# Patient Record
Sex: Female | Born: 1975 | Race: White | Hispanic: No | Smoking: Current some day smoker
Health system: Southern US, Community
[De-identification: ages and names within clinical notes are randomized; demographics above are authoritative.]

## PROBLEM LIST (undated history)

## (undated) ENCOUNTER — Encounter: Attending: Physical Medicine & Rehabilitation | Primary: Physical Medicine & Rehabilitation

## (undated) ENCOUNTER — Ambulatory Visit: Payer: MEDICAID

## (undated) ENCOUNTER — Encounter: Attending: Clinical | Primary: Clinical

## (undated) ENCOUNTER — Ambulatory Visit

## (undated) ENCOUNTER — Ambulatory Visit: Attending: Mental Health | Primary: Mental Health

## (undated) ENCOUNTER — Encounter

## (undated) ENCOUNTER — Ambulatory Visit: Payer: BLUE CROSS/BLUE SHIELD

## (undated) ENCOUNTER — Other Ambulatory Visit: Attending: Clinical | Primary: Clinical

## (undated) ENCOUNTER — Ambulatory Visit: Payer: MEDICARE | Attending: Transplant Hepatology | Primary: Transplant Hepatology

## (undated) ENCOUNTER — Telehealth

## (undated) ENCOUNTER — Other Ambulatory Visit

## (undated) ENCOUNTER — Encounter: Attending: Mental Health | Primary: Mental Health

## (undated) ENCOUNTER — Ambulatory Visit: Attending: Psychiatry | Primary: Psychiatry

## (undated) ENCOUNTER — Ambulatory Visit: Payer: MEDICARE

## (undated) ENCOUNTER — Inpatient Hospital Stay

## (undated) ENCOUNTER — Ambulatory Visit: Payer: PRIVATE HEALTH INSURANCE | Attending: Clinical | Primary: Clinical

## (undated) ENCOUNTER — Ambulatory Visit: Attending: Clinical | Primary: Clinical

## (undated) ENCOUNTER — Ambulatory Visit: Attending: Pharmacist | Primary: Pharmacist

## (undated) ENCOUNTER — Ambulatory Visit: Payer: MEDICAID | Attending: Mental Health | Primary: Mental Health

## (undated) ENCOUNTER — Ambulatory Visit: Payer: MEDICAID | Attending: Physical Medicine & Rehabilitation | Primary: Physical Medicine & Rehabilitation

## (undated) ENCOUNTER — Other Ambulatory Visit: Attending: Psychiatry | Primary: Psychiatry

## (undated) ENCOUNTER — Ambulatory Visit: Payer: PRIVATE HEALTH INSURANCE | Attending: Mental Health | Primary: Mental Health

## (undated) ENCOUNTER — Ambulatory Visit: Payer: MEDICARE | Attending: Clinical | Primary: Clinical

## (undated) ENCOUNTER — Ambulatory Visit
Payer: MEDICAID | Attending: Rehabilitative and Restorative Service Providers" | Primary: Rehabilitative and Restorative Service Providers"

## (undated) ENCOUNTER — Ambulatory Visit: Attending: Family Medicine | Primary: Family Medicine

## (undated) ENCOUNTER — Ambulatory Visit: Payer: PRIVATE HEALTH INSURANCE

## (undated) DIAGNOSIS — M549 Dorsalgia, unspecified: Secondary | ICD-10-CM

## (undated) DIAGNOSIS — J45909 Unspecified asthma, uncomplicated: Secondary | ICD-10-CM

## (undated) DIAGNOSIS — E079 Disorder of thyroid, unspecified: Secondary | ICD-10-CM

## (undated) DIAGNOSIS — K219 Gastro-esophageal reflux disease without esophagitis: Secondary | ICD-10-CM

## (undated) DIAGNOSIS — G8929 Other chronic pain: Secondary | ICD-10-CM

## (undated) DIAGNOSIS — F1010 Alcohol abuse, uncomplicated: Secondary | ICD-10-CM

## (undated) DIAGNOSIS — K859 Acute pancreatitis without necrosis or infection, unspecified: Secondary | ICD-10-CM

## (undated) HISTORY — PX: OTHER SURGICAL HISTORY: SHX169

---

## 2007-10-13 ENCOUNTER — Emergency Department: Payer: Self-pay | Admitting: Emergency Medicine

## 2009-02-12 ENCOUNTER — Emergency Department: Payer: Self-pay | Admitting: Internal Medicine

## 2012-05-05 ENCOUNTER — Emergency Department: Payer: Self-pay | Admitting: Emergency Medicine

## 2013-02-09 ENCOUNTER — Ambulatory Visit: Payer: Self-pay | Admitting: Family Medicine

## 2014-06-20 ENCOUNTER — Emergency Department: Payer: Self-pay | Admitting: Emergency Medicine

## 2014-06-20 LAB — COMPREHENSIVE METABOLIC PANEL
ALT: 16 U/L
Albumin: 3.7 g/dL (ref 3.4–5.0)
Alkaline Phosphatase: 83 U/L
Anion Gap: 10 (ref 7–16)
BUN: 8 mg/dL (ref 7–18)
Bilirubin,Total: 0.8 mg/dL (ref 0.2–1.0)
CREATININE: 1.29 mg/dL (ref 0.60–1.30)
Calcium, Total: 8.5 mg/dL (ref 8.5–10.1)
Chloride: 108 mmol/L — ABNORMAL HIGH (ref 98–107)
Co2: 23 mmol/L (ref 21–32)
GFR CALC NON AF AMER: 52 — AB
Glucose: 101 mg/dL — ABNORMAL HIGH (ref 65–99)
Osmolality: 280 (ref 275–301)
POTASSIUM: 3.4 mmol/L — AB (ref 3.5–5.1)
SGOT(AST): 26 U/L (ref 15–37)
SODIUM: 141 mmol/L (ref 136–145)
Total Protein: 7.2 g/dL (ref 6.4–8.2)

## 2014-06-20 LAB — URINALYSIS, COMPLETE
BILIRUBIN, UR: NEGATIVE
Glucose,UR: NEGATIVE mg/dL (ref 0–75)
KETONE: NEGATIVE
Leukocyte Esterase: NEGATIVE
NITRITE: NEGATIVE
PROTEIN: NEGATIVE
Ph: 6 (ref 4.5–8.0)
Specific Gravity: 1.004 (ref 1.003–1.030)
Squamous Epithelial: 13

## 2014-06-20 LAB — CBC WITH DIFFERENTIAL/PLATELET
Basophil #: 0.1 10*3/uL (ref 0.0–0.1)
Basophil %: 0.5 %
EOS ABS: 0.2 10*3/uL (ref 0.0–0.7)
Eosinophil %: 1.7 %
HCT: 41 % (ref 35.0–47.0)
HGB: 14 g/dL (ref 12.0–16.0)
Lymphocyte #: 2.1 10*3/uL (ref 1.0–3.6)
Lymphocyte %: 19.3 %
MCH: 34.9 pg — ABNORMAL HIGH (ref 26.0–34.0)
MCHC: 34.2 g/dL (ref 32.0–36.0)
MCV: 102 fL — ABNORMAL HIGH (ref 80–100)
MONO ABS: 0.8 x10 3/mm (ref 0.2–0.9)
Monocyte %: 7 %
NEUTROS PCT: 71.5 %
Neutrophil #: 7.8 10*3/uL — ABNORMAL HIGH (ref 1.4–6.5)
PLATELETS: 156 10*3/uL (ref 150–440)
RBC: 4.01 10*6/uL (ref 3.80–5.20)
RDW: 14.1 % (ref 11.5–14.5)
WBC: 10.9 10*3/uL (ref 3.6–11.0)

## 2014-06-20 LAB — LIPASE, BLOOD: Lipase: 2194 U/L — ABNORMAL HIGH (ref 73–393)

## 2015-06-01 ENCOUNTER — Other Ambulatory Visit: Payer: Self-pay | Admitting: Family Medicine

## 2015-06-01 DIAGNOSIS — Z1231 Encounter for screening mammogram for malignant neoplasm of breast: Secondary | ICD-10-CM

## 2015-06-16 ENCOUNTER — Ambulatory Visit: Payer: Self-pay

## 2015-06-22 ENCOUNTER — Ambulatory Visit: Payer: Self-pay | Attending: Family Medicine

## 2015-07-09 ENCOUNTER — Other Ambulatory Visit: Payer: Self-pay

## 2015-07-09 ENCOUNTER — Encounter: Payer: Self-pay | Admitting: Emergency Medicine

## 2015-07-09 ENCOUNTER — Emergency Department
Admission: EM | Admit: 2015-07-09 | Discharge: 2015-07-09 | Disposition: A | Discharge status: Home / Self Care | Payer: Medicaid Other | Attending: Emergency Medicine | Admitting: Emergency Medicine

## 2015-07-09 ENCOUNTER — Emergency Department: Payer: Medicaid Other

## 2015-07-09 DIAGNOSIS — R1013 Epigastric pain: Secondary | ICD-10-CM | POA: Insufficient documentation

## 2015-07-09 DIAGNOSIS — Z720 Tobacco use: Secondary | ICD-10-CM | POA: Diagnosis not present

## 2015-07-09 DIAGNOSIS — R1011 Right upper quadrant pain: Secondary | ICD-10-CM | POA: Insufficient documentation

## 2015-07-09 HISTORY — DX: Disorder of thyroid, unspecified: E07.9

## 2015-07-09 HISTORY — DX: Dorsalgia, unspecified: M54.9

## 2015-07-09 HISTORY — DX: Other chronic pain: G89.29

## 2015-07-09 LAB — URINALYSIS COMPLETE WITH MICROSCOPIC (ARMC ONLY)
BILIRUBIN URINE: NEGATIVE
GLUCOSE, UA: NEGATIVE mg/dL
KETONES UR: NEGATIVE mg/dL
Leukocytes, UA: NEGATIVE
NITRITE: NEGATIVE
PH: 5 (ref 5.0–8.0)
Protein, ur: 30 mg/dL — AB
SPECIFIC GRAVITY, URINE: 1.028 (ref 1.005–1.030)

## 2015-07-09 LAB — LIPASE, BLOOD: Lipase: 45 U/L (ref 22–51)

## 2015-07-09 LAB — COMPREHENSIVE METABOLIC PANEL
ALBUMIN: 4.2 g/dL (ref 3.5–5.0)
ALK PHOS: 92 U/L (ref 38–126)
ALT: 22 U/L (ref 14–54)
AST: 31 U/L (ref 15–41)
Anion gap: 11 (ref 5–15)
BILIRUBIN TOTAL: 1 mg/dL (ref 0.3–1.2)
BUN: 16 mg/dL (ref 6–20)
CALCIUM: 9 mg/dL (ref 8.9–10.3)
CO2: 24 mmol/L (ref 22–32)
CREATININE: 0.84 mg/dL (ref 0.44–1.00)
Chloride: 100 mmol/L — ABNORMAL LOW (ref 101–111)
GFR calc Af Amer: >60 mL/min (ref >60)
GFR calc non Af Amer: >60 mL/min (ref >60)
GLUCOSE: 97 mg/dL (ref 65–99)
Potassium: 3.1 mmol/L — ABNORMAL LOW (ref 3.5–5.1)
SODIUM: 135 mmol/L (ref 135–145)
TOTAL PROTEIN: 7.4 g/dL (ref 6.5–8.1)

## 2015-07-09 LAB — CBC
HCT: 42.3 % (ref 35.0–47.0)
HEMOGLOBIN: 14.3 g/dL (ref 12.0–16.0)
MCH: 33.9 pg (ref 26.0–34.0)
MCHC: 33.9 g/dL (ref 32.0–36.0)
MCV: 100.1 fL — ABNORMAL HIGH (ref 80.0–100.0)
PLATELETS: 234 10*3/uL (ref 150–440)
RBC: 4.22 MIL/uL (ref 3.80–5.20)
RDW: 13.9 % (ref 11.5–14.5)
WBC: 10.8 10*3/uL (ref 3.6–11.0)

## 2015-07-09 MED ORDER — ONDANSETRON 4 MG PO TBDP
4.0000 mg | ORAL_TABLET | Freq: Once | ORAL | Status: AC
  Administered 2015-07-09: 4 mg via ORAL
  Filled 2015-07-09: qty 1

## 2015-07-09 MED ORDER — GI COCKTAIL ~~LOC~~
30.0000 mL | Freq: Once | ORAL | Status: AC
  Administered 2015-07-09: 30 mL via ORAL
  Filled 2015-07-09: qty 30

## 2015-07-09 MED ORDER — PANTOPRAZOLE SODIUM 40 MG PO TBEC: 40.0000 mg | DELAYED_RELEASE_TABLET | Freq: Every day | ORAL | Status: DC

## 2015-07-09 MED ORDER — MORPHINE SULFATE (PF) 4 MG/ML IV SOLN
6.0000 mg | Freq: Once | INTRAVENOUS | Status: AC
  Administered 2015-07-09: 6 mg via INTRAMUSCULAR
  Filled 2015-07-09: qty 2

## 2015-07-09 MED ORDER — SUCRALFATE 1 G PO TABS: 1.0000 g | ORAL_TABLET | Freq: Four times a day (QID) | ORAL | Status: DC

## 2015-07-09 NOTE — ED Notes (Signed)
Pt reports abdominal pain x1 week, nausea/diarrhea/chills/malaise yesterday, but not today. States she was in hospital approx. 8 months ago for similar pain and was diagnosed with pancreatitis.

## 2015-07-09 NOTE — Discharge Instructions (Signed)
Please seek medical attention for any high fevers, chest pain, shortness of breath, change in behavior, persistent vomiting, bloody stool or any other new or concerning symptoms. ° °Gastritis, Adult °Gastritis is soreness and swelling (inflammation) of the lining of the stomach. Gastritis can develop as a sudden onset (acute) or long-term (chronic) condition. If gastritis is not treated, it can lead to stomach bleeding and ulcers. °CAUSES  °Gastritis occurs when the stomach lining is weak or damaged. Digestive juices from the stomach then inflame the weakened stomach lining. The stomach lining may be weak or damaged due to viral or bacterial infections. One common bacterial infection is the Helicobacter pylori infection. Gastritis can also result from excessive alcohol consumption, taking certain medicines, or having too much acid in the stomach.  °SYMPTOMS  °In some cases, there are no symptoms. When symptoms are present, they may include: °· Pain or a burning sensation in the upper abdomen. °· Nausea. °· Vomiting. °· An uncomfortable feeling of fullness after eating. °DIAGNOSIS  °Your caregiver may suspect you have gastritis based on your symptoms and a physical exam. To determine the cause of your gastritis, your caregiver may perform the following: °· Blood or stool tests to check for the H pylori bacterium. °· Gastroscopy. A thin, flexible tube (endoscope) is passed down the esophagus and into the stomach. The endoscope has a light and camera on the end. Your caregiver uses the endoscope to view the inside of the stomach. °· Taking a tissue sample (biopsy) from the stomach to examine under a microscope. °TREATMENT  °Depending on the cause of your gastritis, medicines may be prescribed. If you have a bacterial infection, such as an H pylori infection, antibiotics may be given. If your gastritis is caused by too much acid in the stomach, H2 blockers or antacids may be given. Your caregiver may recommend that you  stop taking aspirin, ibuprofen, or other nonsteroidal anti-inflammatory drugs (NSAIDs). °HOME CARE INSTRUCTIONS °· Only take over-the-counter or prescription medicines as directed by your caregiver. °· If you were given antibiotic medicines, take them as directed. Finish them even if you start to feel better. °· Drink enough fluids to keep your urine clear or pale yellow. °· Avoid foods and drinks that make your symptoms worse, such as: °¨ Caffeine or alcoholic drinks. °¨ Chocolate. °¨ Peppermint or mint flavorings. °¨ Garlic and onions. °¨ Spicy foods. °¨ Citrus fruits, such as oranges, lemons, or limes. °¨ Tomato-based foods such as sauce, chili, salsa, and pizza. °¨ Fried and fatty foods. °· Eat small, frequent meals instead of large meals. °SEEK IMMEDIATE MEDICAL CARE IF:  °· You have black or dark red stools. °· You vomit blood or material that looks like coffee grounds. °· You are unable to keep fluids down. °· Your abdominal pain gets worse. °· You have a fever. °· You do not feel better after 1 week. °· You have any other questions or concerns. °MAKE SURE YOU: °· Understand these instructions. °· Will watch your condition. °· Will get help right away if you are not doing well or get worse. °Document Released: 10/22/2001 Document Revised: 04/28/2012 Document Reviewed: 12/11/2011 °[employer]® Patient Information ©2015 [employer]. This information is not intended to replace advice given to you by your health care provider. Make sure you discuss any questions you have with your health care provider. ° °

## 2015-07-09 NOTE — ED Notes (Signed)
Pt reports epi gastric pain and RUQ pain that started about 1 week ago. And lower back pain. Denies urinary complaints. N/V/D yesterday but none today. Unknown fever reports chills.

## 2015-07-09 NOTE — ED Provider Notes (Signed)
[facility] Emergency Department Provider Note   ____________________________________________  Time seen: 1745  I have reviewed the triage vital signs and the nursing notes.   HISTORY  Chief Complaint Abdominal Pain   History limited by: Not Limited   HPI Terri Mann is a 39 y.o. female who presents to the emergency department today with concerns for epigastric and right upper quadrant pain. Patient states that this pain is been going on for roughly 1 week. It has been intermittent. She states it is worse with eating. It is located in the epigastric and right upper quadrant. Sometimes it does radiate to the back. Patient has had some nausea with dry heaves. She has also had some nonbloody diarrhea. She states she has tried taking some more BC powder recently. She states she had similar symptoms roughly 9 months ago, was seen here, and told she had pancreatitis. She has been trying to clear and light diet.     Past Medical History  Diagnosis Date  . Chronic back pain   . Thyroid disease     There are no active problems to display for this patient.   Past Surgical History  Procedure Laterality Date  . Tubial ligation       No current outpatient prescriptions on file.  Allergies Review of patient's allergies indicates no known allergies.  No family history on file.  Social History Social History  Substance Use Topics  . Smoking status: Current Some Day Smoker -- 2.00 packs/day    Types: Cigarettes  . Smokeless tobacco: Never Used  . Alcohol Use: Yes     Comment: occs.     Review of Systems  Constitutional: Negative for fever. Cardiovascular: Negative for chest pain. Respiratory: Negative for shortness of breath. Gastrointestinal: Positive for epigastric and right upper quadrant pain Genitourinary: Negative for dysuria. Musculoskeletal: Negative for back pain. Skin: Negative for rash. Neurological: Negative for headaches, focal  weakness or numbness.   10-point ROS otherwise negative.  ____________________________________________   PHYSICAL EXAM:  VITAL SIGNS: ED Triage Vitals  Enc Vitals Group     BP 07/09/15 1623 150/90 mmHg     Pulse Rate 07/09/15 1623 78     Resp 07/09/15 1623 18     Temp 07/09/15 1623 98.3 F (36.8 C)     Temp Source 07/09/15 1623 Oral     SpO2 07/09/15 1623 99 %     Weight 07/09/15 1623 175 lb (79.379 kg)     Height 07/09/15 1623  (1.702 m)     Head Cir --      Peak Flow --      Pain Score 07/09/15 1627 7   Constitutional: Alert and oriented. Well appearing and in no distress. Eyes: Conjunctivae are normal. PERRL. Normal extraocular movements. ENT   Head: Normocephalic and atraumatic.   Nose: No congestion/rhinnorhea.   Mouth/Throat: Mucous membranes are moist.   Neck: No stridor. Hematological/Lymphatic/Immunilogical: No cervical lymphadenopathy. Cardiovascular: Normal rate, regular rhythm.  No murmurs, rubs, or gallops. Respiratory: Normal respiratory effort without tachypnea nor retractions. Breath sounds are clear and equal bilaterally. No wheezes/rales/rhonchi. Gastrointestinal: Soft and minimally tender to palpation in the epigastric, right upper quadrant and right abdomen. No rebound. No guarding. No distention. There is no CVA tenderness. Genitourinary: Deferred Musculoskeletal: Normal range of motion in all extremities. No joint effusions.  No lower extremity tenderness nor edema. Neurologic:  Normal speech and language. No gross focal neurologic deficits are appreciated. Speech is normal.  Skin:  Skin is warm, dry and intact. No rash noted. Psychiatric: Mood and affect are normal. Speech and behavior are normal. Patient exhibits appropriate insight and judgment.  ____________________________________________    LABS (pertinent positives/negatives)  Labs Reviewed  COMPREHENSIVE METABOLIC PANEL - Abnormal; Notable for the following:    Potassium  3.1 (*)    Chloride 100 (*)    All other components within normal limits  CBC - Abnormal; Notable for the following:    MCV 100.1 (*)    All other components within normal limits  URINALYSIS COMPLETEWITH MICROSCOPIC ([facility] ONLY) - Abnormal; Notable for the following:    Color, Urine YELLOW (*)    APPearance HAZY (*)    Hgb urine dipstick 1+ (*)    Protein, ur 30 (*)    Bacteria, UA RARE (*)    Squamous Epithelial / LPF 6-30 (*)    All other components within normal limits  LIPASE, BLOOD     ____________________________________________   EKG  I, Phineas Semen, attending physician, personally viewed and interpreted this EKG  EKG Time: 1638 Rate: 76 Rhythm: NSR Axis: normal Intervals: qtc 423 QRS: narrow ST changes: no st elevation ____________________________________________    RADIOLOGY  RUQ US  IMPRESSION: No acute right upper quadrant process.  Similarly coarsened hepatic echotexture can be seen steatosis or hepatocellular disease.  ____________________________________________   PROCEDURES  Procedure(s) performed: None  Critical Care performed: No  ____________________________________________   INITIAL IMPRESSION / ASSESSMENT AND PLAN / ED COURSE  Pertinent labs & imaging results that were available during my care of the patient were reviewed by me and considered in my medical decision making (see chart for details).  Patient presented to the emergency department today with concerns for epigastric upper quadrant pain. Ultrasound without any gallstones or gallbladder disease. Lipase normal. Patient did feel better after GI cocktail. At this point I think gastritis is likely. Will discharge home with sucralfate. Patient is already on Protonix.  ____________________________________________   FINAL CLINICAL IMPRESSION(S) / ED DIAGNOSES  Final diagnoses:  Epigastric abdominal pain     Phineas Semen, MD 07/09/15 2317

## 2015-07-09 NOTE — ED Notes (Signed)
Pt reports relief from GI cocktail. States she is feeling better. PT is comfortable with being discharged.

## 2016-04-28 ENCOUNTER — Emergency Department
Admission: EM | Admit: 2016-04-28 | Discharge: 2016-04-29 | Disposition: A | Discharge status: Home / Self Care | Payer: Medicaid Other | Attending: Emergency Medicine | Admitting: Emergency Medicine

## 2016-04-28 ENCOUNTER — Encounter: Payer: Self-pay | Admitting: Emergency Medicine

## 2016-04-28 DIAGNOSIS — R109 Unspecified abdominal pain: Secondary | ICD-10-CM | POA: Diagnosis present

## 2016-04-28 DIAGNOSIS — K529 Noninfective gastroenteritis and colitis, unspecified: Secondary | ICD-10-CM | POA: Diagnosis not present

## 2016-04-28 DIAGNOSIS — F17210 Nicotine dependence, cigarettes, uncomplicated: Secondary | ICD-10-CM | POA: Insufficient documentation

## 2016-04-28 LAB — URINALYSIS COMPLETE WITH MICROSCOPIC (ARMC ONLY)
BILIRUBIN URINE: NEGATIVE
GLUCOSE, UA: NEGATIVE mg/dL
KETONES UR: NEGATIVE mg/dL
LEUKOCYTES UA: NEGATIVE
NITRITE: NEGATIVE
Protein, ur: 30 mg/dL — AB
SPECIFIC GRAVITY, URINE: 1.026 (ref 1.005–1.030)
pH: 5 (ref 5.0–8.0)

## 2016-04-28 LAB — BASIC METABOLIC PANEL
Anion gap: 10 (ref 5–15)
BUN: 17 mg/dL (ref 6–20)
CO2: 25 mmol/L (ref 22–32)
Calcium: 9 mg/dL (ref 8.9–10.3)
Chloride: 104 mmol/L (ref 101–111)
Creatinine, Ser: 1.04 mg/dL — ABNORMAL HIGH (ref 0.44–1.00)
GFR calc Af Amer: >60 mL/min (ref >60)
GLUCOSE: 96 mg/dL (ref 65–99)
POTASSIUM: 3.2 mmol/L — AB (ref 3.5–5.1)
Sodium: 139 mmol/L (ref 135–145)

## 2016-04-28 LAB — CBC
HCT: 39.9 % (ref 35.0–47.0)
Hemoglobin: 13.6 g/dL (ref 12.0–16.0)
MCH: 32.9 pg (ref 26.0–34.0)
MCHC: 34.2 g/dL (ref 32.0–36.0)
MCV: 96.2 fL (ref 80.0–100.0)
PLATELETS: 208 10*3/uL (ref 150–440)
RBC: 4.14 MIL/uL (ref 3.80–5.20)
RDW: 14 % (ref 11.5–14.5)
WBC: 11.6 10*3/uL — ABNORMAL HIGH (ref 3.6–11.0)

## 2016-04-28 MED ORDER — OXYCODONE-ACETAMINOPHEN 5-325 MG PO TABS
ORAL_TABLET | ORAL | Status: AC
  Administered 2016-04-29: 1 via ORAL
  Filled 2016-04-28: qty 1

## 2016-04-28 MED ORDER — OXYCODONE-ACETAMINOPHEN 5-325 MG PO TABS
1.0000 | ORAL_TABLET | ORAL | Status: AC | PRN
Start: 2016-04-28 — End: 2016-04-29
  Administered 2016-04-28 – 2016-04-29 (×2): 1 via ORAL
  Filled 2016-04-28: qty 1

## 2016-04-28 NOTE — ED Notes (Signed)
Pt states right flank pain that began 3 days pta. Pt denies nausea, vomiting, fever, chills. Pt states "it's getting worse, i can't get comfortable." pt appears in no acute distress in triage.

## 2016-04-29 ENCOUNTER — Emergency Department: Payer: Medicaid Other

## 2016-04-29 LAB — HEPATIC FUNCTION PANEL
ALT: 27 U/L (ref 14–54)
AST: 30 U/L (ref 15–41)
Albumin: 4.2 g/dL (ref 3.5–5.0)
Alkaline Phosphatase: 73 U/L (ref 38–126)
Bilirubin, Direct: <0.1 mg/dL — ABNORMAL LOW (ref 0.1–0.5)
TOTAL PROTEIN: 7 g/dL (ref 6.5–8.1)
Total Bilirubin: 0.3 mg/dL (ref 0.3–1.2)

## 2016-04-29 LAB — PREGNANCY, URINE: PREG TEST UR: NEGATIVE

## 2016-04-29 LAB — LIPASE, BLOOD: LIPASE: 48 U/L (ref 11–51)

## 2016-04-29 MED ORDER — METRONIDAZOLE 500 MG PO TABS: 500.0000 mg | ORAL_TABLET | Freq: Two times a day (BID) | ORAL | Status: DC

## 2016-04-29 MED ORDER — MORPHINE SULFATE (PF) 4 MG/ML IV SOLN
4.0000 mg | Freq: Once | INTRAVENOUS | Status: AC
  Administered 2016-04-29: 4 mg via INTRAVENOUS
  Filled 2016-04-29: qty 1

## 2016-04-29 MED ORDER — ONDANSETRON 4 MG PO TBDP: 4.0000 mg | ORAL_TABLET | Freq: Three times a day (TID) | ORAL | Status: DC | PRN

## 2016-04-29 MED ORDER — CIPROFLOXACIN HCL 500 MG PO TABS
500.0000 mg | ORAL_TABLET | Freq: Once | ORAL | Status: AC
  Administered 2016-04-29: 500 mg via ORAL
  Filled 2016-04-29: qty 1

## 2016-04-29 MED ORDER — METRONIDAZOLE 500 MG PO TABS
500.0000 mg | ORAL_TABLET | Freq: Once | ORAL | Status: AC
  Administered 2016-04-29: 500 mg via ORAL
  Filled 2016-04-29: qty 1

## 2016-04-29 MED ORDER — ONDANSETRON HCL 4 MG/2ML IJ SOLN
4.0000 mg | Freq: Once | INTRAMUSCULAR | Status: AC
Start: 2016-04-29 — End: 2016-04-29
  Administered 2016-04-29: 4 mg via INTRAVENOUS
  Filled 2016-04-29: qty 2

## 2016-04-29 MED ORDER — ONDANSETRON HCL 4 MG/2ML IJ SOLN
4.0000 mg | Freq: Once | INTRAMUSCULAR | Status: AC
  Administered 2016-04-29: 4 mg via INTRAVENOUS
  Filled 2016-04-29: qty 2

## 2016-04-29 MED ORDER — SODIUM CHLORIDE 0.9 % IV BOLUS (SEPSIS)
1000.0000 mL | Freq: Once | INTRAVENOUS | Status: AC
  Administered 2016-04-29: 1000 mL via INTRAVENOUS

## 2016-04-29 MED ORDER — CIPROFLOXACIN HCL 500 MG PO TABS
500.0000 mg | ORAL_TABLET | Freq: Two times a day (BID) | ORAL | Status: DC
Start: 2016-04-29 — End: 2016-08-10

## 2016-04-29 MED ORDER — OXYCODONE-ACETAMINOPHEN 5-325 MG PO TABS: 1.0000 | ORAL_TABLET | ORAL | Status: DC | PRN

## 2016-04-29 NOTE — ED Notes (Signed)
Pt found in room att 

## 2016-04-29 NOTE — ED Notes (Signed)
Patient transported to CT 

## 2016-04-29 NOTE — ED Notes (Signed)
Returned from CT.

## 2016-04-29 NOTE — Discharge Instructions (Signed)
1. Take antibiotics as prescribed (Cipro/Flagyl 500 mg twice daily 7 days). 2. You may take medicines as needed for pain and nausea (Percocet/Zofran #20). 3. Take a stool softener while taking narcotic pain medications to prevent constipation. 4. Return to the ER for worsening symptoms, persistent vomiting, fever or other concerns.  Abdominal Pain, Adult Many things can cause abdominal pain. Usually, abdominal pain is not caused by a disease and will improve without treatment. It can often be observed and treated at home. Your health care provider will do a physical exam and possibly order blood tests and X-rays to help determine the seriousness of your pain. However, in many cases, more time must pass before a clear cause of the pain can be found. Before that point, your health care provider may not know if you need more testing or further treatment. HOME CARE INSTRUCTIONS Monitor your abdominal pain for any changes. The following actions may help to alleviate any discomfort you are experiencing:  Only take over-the-counter or prescription medicines as directed by your health care provider.  Do not take laxatives unless directed to do so by your health care provider.  Try a clear liquid diet (broth, tea, or water) as directed by your health care provider. Slowly move to a bland diet as tolerated. SEEK MEDICAL CARE IF:  You have unexplained abdominal pain.  You have abdominal pain associated with nausea or diarrhea.  You have pain when you urinate or have a bowel movement.  You experience abdominal pain that wakes you in the night.  You have abdominal pain that is worsened or improved by eating food.  You have abdominal pain that is worsened with eating fatty foods.  You have a fever. SEEK IMMEDIATE MEDICAL CARE IF:  Your pain does not go away within 2 hours.  You keep throwing up (vomiting).  Your pain is felt only in portions of the abdomen, such as the right side or the left  lower portion of the abdomen.  You pass bloody or black tarry stools. MAKE SURE YOU:  Understand these instructions.  Will watch your condition.  Will get help right away if you are not doing well or get worse.   This information is not intended to replace advice given to you by your health care provider. Make sure you discuss any questions you have with your health care provider.   Document Released: 08/07/2005 Document Revised: 07/19/2015 Document Reviewed: 07/07/2013 Elsevier Interactive Patient Education 2016 [employer]  Colitis Colitis is inflammation of the colon. Colitis may last a short time (acute) or it may last a long time (chronic). CAUSES This condition may be caused by:  Viruses.  Bacteria.  Reactions to medicine.  Certain autoimmune diseases, such as Crohn disease or ulcerative colitis. SYMPTOMS Symptoms of this condition include:  Diarrhea.  Passing bloody or tarry stool.  Pain.  Fever.  Vomiting.  Tiredness (fatigue).  Weight loss.  Bloating.  Sudden increase in abdominal pain.  Having fewer bowel movements than usual. DIAGNOSIS This condition is diagnosed with a stool test or a blood test. You may also have other tests, including X-rays, a CT scan, or a colonoscopy. TREATMENT Treatment may include:  Resting the bowel. This involves not eating or drinking for a period of time.  Fluids that are given through an IV tube.  Medicine for pain and diarrhea.  Antibiotic medicines.  Cortisone medicines.  Surgery. HOME CARE INSTRUCTIONS Eating and Drinking  Follow instructions from your health care provider about eating or  drinking restrictions.  Drink enough fluid to keep your urine clear or pale yellow.  Work with a dietitian to determine which foods cause your condition to flare up.  Avoid foods that cause flare-ups.  Eat a well-balanced diet. Medicines  Take over-the-counter and prescription medicines only as told by your  health care provider.  If you were prescribed an antibiotic medicine, take it as told by your health care provider. Do not stop taking the antibiotic even if you start to feel better. General Instructions  Keep all follow-up visits as told by your health care provider. This is important. SEEK MEDICAL CARE IF:  Your symptoms do not go away.  You develop new symptoms. SEEK IMMEDIATE MEDICAL CARE IF:  You have a fever that does not go away with treatment.  You develop chills.  You have extreme weakness, fainting, or dehydration.  You have repeated vomiting.  You develop severe pain in your abdomen.  You pass bloody or tarry stool.   This information is not intended to replace advice given to you by your health care provider. Make sure you discuss any questions you have with your health care provider.   Document Released: 12/05/2004 Document Revised: 07/19/2015 Document Reviewed: 02/20/2015 Elsevier Interactive Patient Education 2016 [employer]  Flank Pain Flank pain refers to pain that is located on the side of the body between the upper abdomen and the back. The pain may occur over a short period of time (acute) or may be long-term or reoccurring (chronic). It may be mild or severe. Flank pain can be caused by many things. CAUSES  Some of the more common causes of flank pain include:  Muscle strains.   Muscle spasms.   A disease of your spine (vertebral disk disease).   A lung infection (pneumonia).   Fluid around your lungs (pulmonary edema).   A kidney infection.   Kidney stones.   A very painful skin rash caused by the chickenpox virus (shingles).   Gallbladder disease.  HOME CARE INSTRUCTIONS  Home care will depend on the cause of your pain. In general,  Rest as directed by your caregiver.  Drink enough fluids to keep your urine clear or pale yellow.  Only take over-the-counter or prescription medicines as directed by your caregiver. Some  medicines may help relieve the pain.  Tell your caregiver about any changes in your pain.  Follow up with your caregiver as directed. SEEK IMMEDIATE MEDICAL CARE IF:   Your pain is not controlled with medicine.   You have new or worsening symptoms.  Your pain increases.   You have abdominal pain.   You have shortness of breath.   You have persistent nausea or vomiting.   You have swelling in your abdomen.   You feel faint or pass out.   You have blood in your urine.  You have a fever or persistent symptoms for more than 2-3 days.  You have a fever and your symptoms suddenly get worse. MAKE SURE YOU:   Understand these instructions.  Will watch your condition.  Will get help right away if you are not doing well or get worse.   This information is not intended to replace advice given to you by your health care provider. Make sure you discuss any questions you have with your health care provider.   Document Released: 12/19/2005 Document Revised: 07/22/2012 Document Reviewed: 06/11/2012 Elsevier Interactive Patient Education [employer]

## 2016-04-29 NOTE — ED Provider Notes (Signed)
[facility] Emergency Department Provider Note   ____________________________________________  Time seen: Approximately 12:30 AM  I have reviewed the triage vital signs and the nursing notes.   HISTORY  Chief Complaint Flank Pain    HPI Terri Mann is a 40 y.o. female presents to the ED from home with a chief complaint of right flank pain. Patient reports onset of right flank pain approximately 3 days ago, radiating to her right lateral abdomen. Describes pain as aching and crampy. Denies associated nausea, vomiting, dysuria, hematuria. Denies recent fever, chills, chest pain, shortness of breath. Denies recent travel or trauma. Nothing makes her pain better or worse.   Past Medical History  Diagnosis Date  . Chronic back pain   . Thyroid disease     There are no active problems to display for this patient.   Past Surgical History  Procedure Laterality Date  . Tubial ligation       Current Outpatient Rx  Name  Route  Sig  Dispense  Refill  . ciprofloxacin (CIPRO) 500 MG tablet   Oral   Take 1 tablet (500 mg total) by mouth 2 (two) times daily.   14 tablet   0   . metroNIDAZOLE (FLAGYL) 500 MG tablet   Oral   Take 1 tablet (500 mg total) by mouth 2 (two) times daily.   14 tablet   0   . ondansetron (ZOFRAN ODT) 4 MG disintegrating tablet   Oral   Take 1 tablet (4 mg total) by mouth every 8 (eight) hours as needed for nausea or vomiting.   20 tablet   0   . oxyCODONE-acetaminophen (ROXICET) 5-325 MG tablet   Oral   Take 1 tablet by mouth every 4 (four) hours as needed for severe pain.   20 tablet   0   . pantoprazole (PROTONIX) 40 MG tablet   Oral   Take 1 tablet (40 mg total) by mouth daily.   30 tablet   1   . sucralfate (CARAFATE) 1 G tablet   Oral   Take 1 tablet (1 g total) by mouth 4 (four) times daily.   60 tablet   0     Allergies Review of patient's allergies indicates no known allergies.  Family  history Nephrolithiasis  Social History Social History  Substance Use Topics  . Smoking status: Current Some Day Smoker -- 2.00 packs/day    Types: Cigarettes  . Smokeless tobacco: Never Used  . Alcohol Use: No     Comment: occs.     Review of Systems  Constitutional: No fever/chills. Eyes: No visual changes. ENT: No sore throat. Cardiovascular: Denies chest pain. Respiratory: Denies shortness of breath. Gastrointestinal: No abdominal pain.  No nausea, no vomiting.  No diarrhea.  No constipation. Positive for right flank pain. Genitourinary: Negative for dysuria. Musculoskeletal: Negative for back pain. Skin: Negative for rash. Neurological: Negative for headaches, focal weakness or numbness.  10-point ROS otherwise negative.  ____________________________________________   PHYSICAL EXAM:  VITAL SIGNS: ED Triage Vitals  Enc Vitals Group     BP 04/28/16 2144 138/72 mmHg     Pulse Rate 04/28/16 2144 92     Resp 04/28/16 2144 18     Temp 04/28/16 2144 98.1 F (36.7 C)     Temp Source 04/28/16 2144 Oral     SpO2 04/28/16 2144 99 %     Weight 04/28/16 2144 170 lb (77.111 kg)     Height 04/28/16 2144 5'  8" (1.727 m)     Head Cir --      Peak Flow --      Pain Score 04/28/16 2147 7     Pain Loc --      Pain Edu? --      Excl. in GC? --     Constitutional: Alert and oriented. Well appearing and in no acute distress. Eyes: Conjunctivae are normal. PERRL. EOMI. Head: Atraumatic. Nose: No congestion/rhinnorhea. Mouth/Throat: Mucous membranes are moist.  Oropharynx non-erythematous. Neck: No stridor.   Cardiovascular: Normal rate, regular rhythm. Grossly normal heart sounds.  Good peripheral circulation. Respiratory: Normal respiratory effort.  No retractions. Lungs CTAB. Gastrointestinal: Soft and nontender. No distention. No abdominal bruits. Mild right CVA tenderness. Musculoskeletal: No lower extremity tenderness nor edema.  No joint effusions. Neurologic:   Normal speech and language. No gross focal neurologic deficits are appreciated. No gait instability. Skin:  Skin is warm, dry and intact. No rash noted. Psychiatric: Mood and affect are normal. Speech and behavior are normal.  ____________________________________________   LABS (all labs ordered are listed, but only abnormal results are displayed)  Labs Reviewed  URINALYSIS COMPLETEWITH MICROSCOPIC ([facility] ONLY) - Abnormal; Notable for the following:    Color, Urine YELLOW (*)    APPearance HAZY (*)    Hgb urine dipstick 1+ (*)    Protein, ur 30 (*)    Bacteria, UA RARE (*)    Squamous Epithelial / LPF 6-30 (*)    All other components within normal limits  CBC - Abnormal; Notable for the following:    WBC 11.6 (*)    All other components within normal limits  BASIC METABOLIC PANEL - Abnormal; Notable for the following:    Potassium 3.2 (*)    Creatinine, Ser 1.04 (*)    All other components within normal limits  HEPATIC FUNCTION PANEL - Abnormal; Notable for the following:    Bilirubin, Direct <0.1 (*)    All other components within normal limits  PREGNANCY, URINE  LIPASE, BLOOD   ____________________________________________  EKG  None ____________________________________________  RADIOLOGY  CT abdomen/pelvis interpreted per Dr. Cherly Hensen: 1. Mild soft tissue inflammation along the cecum and ascending colon, which could reflect a mild infectious or inflammatory process. Chronic fatty infiltration of the wall of the colon, likely reflecting chronic sequelae of inflammation. 2. Minimal soft tissue inflammation suggested about the head of the pancreas. This could simply be artifactual, or could reflect minimal pancreatitis. Would correlate with lipase levels. 3. 7 mm nonspecific hypodensity within the left hepatic lobe. 4. Minimal calcification along the abdominal aorta, mildly advanced for age. ____________________________________________   PROCEDURES  Procedure(s)  performed: None  Critical Care performed: No  ____________________________________________   INITIAL IMPRESSION / ASSESSMENT AND PLAN / ED COURSE  Pertinent labs & imaging results that were available during my care of the patient were reviewed by me and considered in my medical decision making (see chart for details).  40 year old female who presents with right flank pain. Pain unrelieved with Percocet administered while awaiting treatment room. Laboratory and urinalysis results remarkable for 0-5 RBC and WBC. Symptoms suggest is of kidney stone. Will initiate IV fluid resuscitation, analgesia and obtain CT renal colic study.  ----------------------------------------- 2:58 AM on 04/29/2016 -----------------------------------------  Updated patient of CT results. Will treat with Cipro and Flagyl. Added LFTs and lipase; awaiting results.  ----------------------------------------- 3:55 AM on 04/29/2016 -----------------------------------------  Updated patient of unremarkable LFTs and lipase. Plan to treat with Cipro and Flagyl, analgesia and antiemetic  as needed. Patient follow-up with PCP early next week. Strict return precautions given. Patient verbalizes understanding and agrees with plan of care. ____________________________________________   FINAL CLINICAL IMPRESSION(S) / ED DIAGNOSES  Final diagnoses:  Flank pain  Abdominal pain, unspecified abdominal location  Colitis      NEW MEDICATIONS STARTED DURING THIS VISIT:  Discharge Medication List as of 04/29/2016  3:55 AM    START taking these medications   Details  ciprofloxacin (CIPRO) 500 MG tablet Take 1 tablet (500 mg total) by mouth 2 (two) times daily., Starting 04/29/2016, Until Discontinued, Print    metroNIDAZOLE (FLAGYL) 500 MG tablet Take 1 tablet (500 mg total) by mouth 2 (two) times daily., Starting 04/29/2016, Until Discontinued, Print    ondansetron (ZOFRAN ODT) 4 MG disintegrating tablet Take 1 tablet (4  mg total) by mouth every 8 (eight) hours as needed for nausea or vomiting., Starting 04/29/2016, Until Discontinued, Print    oxyCODONE-acetaminophen (ROXICET) 5-325 MG tablet Take 1 tablet by mouth every 4 (four) hours as needed for severe pain., Starting 04/29/2016, Until Discontinued, Print         Note:  This document was prepared using Dragon voice recognition software and may include unintentional dictation errors.    Irean HongJade J Sung, MD 04/29/16 0730

## 2016-06-03 ENCOUNTER — Emergency Department: Payer: Medicaid Other

## 2016-06-03 ENCOUNTER — Emergency Department
Admission: EM | Admit: 2016-06-03 | Discharge: 2016-06-04 | Disposition: A | Discharge status: Home / Self Care | Payer: Medicaid Other | Attending: Emergency Medicine | Admitting: Emergency Medicine

## 2016-06-03 ENCOUNTER — Encounter: Payer: Self-pay | Admitting: Emergency Medicine

## 2016-06-03 DIAGNOSIS — R1011 Right upper quadrant pain: Secondary | ICD-10-CM | POA: Diagnosis present

## 2016-06-03 DIAGNOSIS — J45909 Unspecified asthma, uncomplicated: Secondary | ICD-10-CM | POA: Insufficient documentation

## 2016-06-03 DIAGNOSIS — F17210 Nicotine dependence, cigarettes, uncomplicated: Secondary | ICD-10-CM | POA: Diagnosis not present

## 2016-06-03 DIAGNOSIS — R109 Unspecified abdominal pain: Secondary | ICD-10-CM

## 2016-06-03 HISTORY — DX: Unspecified asthma, uncomplicated: J45.909

## 2016-06-03 LAB — COMPREHENSIVE METABOLIC PANEL
ALBUMIN: 4.1 g/dL (ref 3.5–5.0)
ALT: 25 U/L (ref 14–54)
AST: 35 U/L (ref 15–41)
Alkaline Phosphatase: 95 U/L (ref 38–126)
Anion gap: 11 (ref 5–15)
BUN: 8 mg/dL (ref 6–20)
CHLORIDE: 105 mmol/L (ref 101–111)
CO2: 22 mmol/L (ref 22–32)
CREATININE: 0.69 mg/dL (ref 0.44–1.00)
Calcium: 9.1 mg/dL (ref 8.9–10.3)
GFR calc Af Amer: >60 mL/min (ref >60)
GLUCOSE: 119 mg/dL — AB (ref 65–99)
Potassium: 3.7 mmol/L (ref 3.5–5.1)
Sodium: 138 mmol/L (ref 135–145)
Total Bilirubin: 0.4 mg/dL (ref 0.3–1.2)
Total Protein: 7.4 g/dL (ref 6.5–8.1)

## 2016-06-03 LAB — URINALYSIS COMPLETE WITH MICROSCOPIC (ARMC ONLY)
Glucose, UA: NEGATIVE mg/dL
KETONES UR: NEGATIVE mg/dL
Nitrite: NEGATIVE
PROTEIN: 100 mg/dL — AB
Specific Gravity, Urine: 1.032 — ABNORMAL HIGH (ref 1.005–1.030)
pH: 5 (ref 5.0–8.0)

## 2016-06-03 LAB — CBC
HCT: 46.1 % (ref 35.0–47.0)
Hemoglobin: 15.3 g/dL (ref 12.0–16.0)
MCH: 33.1 pg (ref 26.0–34.0)
MCHC: 33.1 g/dL (ref 32.0–36.0)
MCV: 100 fL (ref 80.0–100.0)
PLATELETS: 194 10*3/uL (ref 150–440)
RBC: 4.61 MIL/uL (ref 3.80–5.20)
RDW: 15.8 % — AB (ref 11.5–14.5)
WBC: 6.1 10*3/uL (ref 3.6–11.0)

## 2016-06-03 LAB — LIPASE, BLOOD: LIPASE: 44 U/L (ref 11–51)

## 2016-06-03 LAB — POCT PREGNANCY, URINE: Preg Test, Ur: NEGATIVE

## 2016-06-03 MED ORDER — MORPHINE SULFATE (PF) 4 MG/ML IV SOLN
4.0000 mg | Freq: Once | INTRAVENOUS | Status: AC
  Administered 2016-06-03: 4 mg via INTRAVENOUS

## 2016-06-03 MED ORDER — SODIUM CHLORIDE 0.9 % IV BOLUS (SEPSIS)
1000.0000 mL | Freq: Once | INTRAVENOUS | Status: AC
Start: 2016-06-03 — End: 2016-06-03
  Administered 2016-06-03: 1000 mL via INTRAVENOUS

## 2016-06-03 MED ORDER — MORPHINE SULFATE (PF) 4 MG/ML IV SOLN
INTRAVENOUS | Status: AC
  Administered 2016-06-03: 4 mg via INTRAVENOUS
  Filled 2016-06-03: qty 1

## 2016-06-03 MED ORDER — OXYCODONE-ACETAMINOPHEN 5-325 MG PO TABS
1.0000 | ORAL_TABLET | Freq: Once | ORAL | Status: AC
  Administered 2016-06-03: 1 via ORAL
  Filled 2016-06-03: qty 1

## 2016-06-03 MED ORDER — DIATRIZOATE MEGLUMINE & SODIUM 66-10 % PO SOLN
15.0000 mL | Freq: Once | ORAL | Status: AC
  Administered 2016-06-03: 15 mL via ORAL

## 2016-06-03 MED ORDER — IOPAMIDOL (ISOVUE-300) INJECTION 61%
100.0000 mL | Freq: Once | INTRAVENOUS | Status: AC | PRN
  Administered 2016-06-03: 100 mL via INTRAVENOUS

## 2016-06-03 NOTE — ED Triage Notes (Signed)
C?o right side pain.  Seen for same a couple of weeks ago and was diagnosed with a colon infection.  Given 2 antibiotics and finished coarse.  Symptoms worsening.

## 2016-06-03 NOTE — ED Notes (Signed)
Pt refused to be put in gown at this time.

## 2016-06-03 NOTE — ED Notes (Signed)
Patient has completed oral contrast, notified CT

## 2016-06-03 NOTE — ED Notes (Signed)
Patient c/o continued abdominal pain = MD notified and gave VORB for 4mg  Morphine IV verbal order

## 2016-06-03 NOTE — ED Provider Notes (Signed)
[facility] Emergency Department Provider Note   ____________________________________________  Time seen: Approximately 4:10 PM  I have reviewed the triage vital signs and the nursing notes.   HISTORY  Chief Complaint Abdominal Pain   HPI Terri Mann is a 40 y.o. female with a history of thyroid disease was presenting to the emergency department today with right upper quadrant abdominal pain has been ongoing for the past month. She was seen in the emergency department in late June and was started on Cipro Flagyl for A CAT scan that showed inflammation in the abdomen. She said that the pain stopped after about 4 days then as returned. Says the pain as a 6-7 out of 10 right now to the right upper quadrant. She says it is sore feeling like she has just run a marathon. The patient also says that she had abnormal vaginal bleeding earlier this month for about 5 days. She says that she is usually regular with her periods. No discharge. History of a tubal ligation. Denies any nausea vomiting or diarrhea. Says that she has had regular bowel movements. York Spaniel that she had a bowel movement just earlier today. Says that she has had a decreased appetite with this pain though.   Past Medical History:  Diagnosis Date  . Asthma   . Chronic back pain   . Thyroid disease     There are no active problems to display for this patient.   Past Surgical History:  Procedure Laterality Date  . tubial ligation       Current Outpatient Rx  . Order #: [id number] Class: Print  . Order #: [id number] Class: Print  . Order #: [id number] Class: Print  . Order #: [id number] Class: Print  . Order #: [id number] Class: Print  . Order #: [id number] Class: Print    Allergies Review of patient's allergies indicates no known allergies.  No family history on file.  Social History Social History  Substance Use Topics  . Smoking status: Current Some Day Smoker    Packs/day: 2.00    Types:  Cigarettes  . Smokeless tobacco: Never Used  . Alcohol use No     Comment: occs.     Review of Systems Constitutional: No fever/chills Eyes: No visual changes. ENT: No sore throat. Cardiovascular: Denies chest pain. Respiratory: Denies shortness of breath. Gastrointestinal: No nausea, no vomiting.  No diarrhea.  No constipation. Genitourinary: Negative for dysuria. Musculoskeletal: Negative for back pain. Skin: Negative for rash. Neurological: Negative for headaches, focal weakness or numbness.  10-point ROS otherwise negative.  ____________________________________________   PHYSICAL EXAM:  VITAL SIGNS: ED Triage Vitals  Enc Vitals Group     BP 06/03/16 1442 137/87     Pulse Rate 06/03/16 1442 75     Resp 06/03/16 1442 16     Temp 06/03/16 1442 97.7 F (36.5 C)     Temp Source 06/03/16 1442 Oral     SpO2 06/03/16 1442 97 %     Weight 06/03/16 1442 178 lb (80.7 kg)     Height 06/03/16 1442 5\' 8"  (1.727 m)     Head Circumference --      Peak Flow --      Pain Score 06/03/16 1443 8     Pain Loc --      Pain Edu? --      Excl. in GC? --     Constitutional: Alert and oriented. Well appearing and in no acute distress. Eyes: Conjunctivae are normal. PERRL. EOMI. Head: Atraumatic. Nose: No  congestion/rhinnorhea. Mouth/Throat: Mucous membranes are moist.   Neck: No stridor.   Cardiovascular: Normal rate, regular rhythm. Grossly normal heart sounds.   Respiratory: Normal respiratory effort.  No retractions. Lungs CTAB. Gastrointestinal: Soft With mild right upper quadrant abdominal tenderness. The belly is soft and there is no guarding.. Mild distention. No CVA tenderness. Musculoskeletal: No lower extremity tenderness nor edema.  No joint effusions. Neurologic:  Normal speech and language. No gross focal neurologic deficits are appreciated. Skin:  Skin is warm, dry and intact. No rash noted. Psychiatric: Mood and affect are normal. Speech and behavior are  normal.  ____________________________________________   LABS (all labs ordered are listed, but only abnormal results are displayed)  Labs Reviewed  COMPREHENSIVE METABOLIC PANEL - Abnormal; Notable for the following:       Result Value   Glucose, Bld 119 (*)    All other components within normal limits  CBC - Abnormal; Notable for the following:    RDW 15.8 (*)    All other components within normal limits  URINALYSIS COMPLETEWITH MICROSCOPIC ([facility] ONLY) - Abnormal; Notable for the following:    Color, Urine AMBER (*)    APPearance HAZY (*)    Bilirubin Urine 1+ (*)    Specific Gravity, Urine 1.032 (*)    Hgb urine dipstick 1+ (*)    Protein, ur 100 (*)    Leukocytes, UA TRACE (*)    Bacteria, UA RARE (*)    Squamous Epithelial / LPF 6-30 (*)    All other components within normal limits  LIPASE, BLOOD  POCT PREGNANCY, URINE   ____________________________________________  EKG   ____________________________________________  RADIOLOGY  CLINICAL DATA:  Right-sided abdominal pain. EXAM: ABDOMEN - 2 VIEW COMPARISON:  CT abdomen and pelvis 04/29/2016 FINDINGS: There is no evidence of intraperitoneal free air. A moderate amount of stool is present in the ascending colon, with a small amount of stool more distally. Gas is present in nondilated loops of small bowel. No significant bowel air-fluid levels are identified. Small phleboliths in the pelvis. No acute osseous abnormality identified. Visualized lung bases are clear. IMPRESSION: Nonobstructed bowel gas pattern. Electronically Signed   By: Sebastian Ache M.D.   On: 06/03/2016 16:44  CLINICAL DATA:  40 year old female with right upper quadrant pain for the past month. Post antibiotic treatment for inflammation surrounding the colon. Subsequent encounter. EXAM: US ABDOMEN LIMITED - RIGHT UPPER QUADRANT COMPARISON:  06/03/2016 plain film exam. 04/29/2016 CT abdomen and pelvis. 07/01/2015 right upper quadrant  ultrasound. FINDINGS: Gallbladder: No gallstones or wall thickening visualized. No sonographic Murphy sign noted by sonographer. Common bile duct: Diameter: 3.5 mm. Liver: Increased echogenicity liver suggestive of mild fatty infiltration. The CT detected 7 mm hypodensity within left lobe liver is not appreciated on present ultrasound. IMPRESSION: No gallstones or findings to suggest gallbladder inflammation. Mild fatty infiltration of the liver suspected. Electronically Signed   By: Lacy Duverney M.D.   On: 06/03/2016 17:15 ____________________________________________   PROCEDURES   Procedures  ____________________________________________   INITIAL IMPRESSION / ASSESSMENT AND PLAN / ED COURSE  Pertinent labs & imaging results that were available during my care of the patient were reviewed by me and considered in my medical decision making (see chart for details).  ----------------------------------------- 6:27 PM on 06/03/2016 -----------------------------------------  Patient's pain is relieved after Percocet. Very reassuring radiology studies. We'll proceed to CAT scan because of previous inflammatory finding. Patient aware of this plan and willing to comply.  ----------------------------------------- 11:30 PM on 06/03/2016 -----------------------------------------  Patient still pending CAT scan at this time. Patient required second dose of pain medication. Received IV morphine. Signed out to Dr. Zenda Alpers for follow-up with imaging. Likely the skin is normal then the patient may be having spasm. ____________________________________________   FINAL CLINICAL IMPRESSION(S) / ED DIAGNOSES  Final diagnoses:  Abdominal pain      NEW MEDICATIONS STARTED DURING THIS VISIT:  New Prescriptions   No medications on file     Note:  This document was prepared using Dragon voice recognition software and may include unintentional dictation errors.    Myrna Blazer, MD 06/03/16 [phone]

## 2016-06-03 NOTE — ED Notes (Signed)
Patient c/o CT wait time, I called CT and they said this patient was next.  Patient notified of response.

## 2016-06-04 NOTE — ED Provider Notes (Signed)
-----------------------------------------   7:50 AM on 06/04/2016 -----------------------------------------   Blood pressure 127/83, pulse 82, temperature 97.7 F (36.5 C), temperature source Oral, resp. rate 16, height 5\' 8"  (1.727 m), weight 178 lb (80.7 kg), last menstrual period 05/30/2016, SpO2 96 %.  Assuming care from Dr. Pershing Proud.  In short, Terri Mann is a 40 y.o. female with a chief complaint of Abdominal Pain .  Refer to the original H&P for additional details.  The current plan of care is to follow up the result of the CT scan.  The patient told the nurse that she wanted to go outside and smoke and she was tired of waiting. We informed the patient that we did not have the results of her CT scan. The patient reported that she didn't care and she will return if she needed anything further. The patient sign out AGAINST MEDICAL ADVICE without receiving the results of her CT scan.  I did look up the results of the patient's CT scan and it shows No CT evidence for acute intra-abdominal or pelvic process and Normal appendix.    Rebecka Apley, MD 06/04/16 [phone]

## 2016-06-04 NOTE — ED Notes (Signed)
Patient given lemon swabs for dry mouth

## 2016-08-10 ENCOUNTER — Encounter: Payer: Self-pay | Admitting: Emergency Medicine

## 2016-08-10 ENCOUNTER — Inpatient Hospital Stay
Admission: EM | Admit: 2016-08-10 | Discharge: 2016-08-13 | DRG: 440 | Disposition: A | Discharge status: Home / Self Care | Payer: Medicaid Other | Attending: Internal Medicine | Admitting: Internal Medicine

## 2016-08-10 ENCOUNTER — Emergency Department
Admission: EM | Admit: 2016-08-10 | Discharge: 2016-08-10 | Disposition: A | Discharge status: Home / Self Care | Payer: Medicaid Other | Source: Home / Self Care | Attending: Emergency Medicine | Admitting: Emergency Medicine

## 2016-08-10 ENCOUNTER — Emergency Department: Payer: Medicaid Other

## 2016-08-10 DIAGNOSIS — K8689 Other specified diseases of pancreas: Secondary | ICD-10-CM | POA: Diagnosis present

## 2016-08-10 DIAGNOSIS — K219 Gastro-esophageal reflux disease without esophagitis: Secondary | ICD-10-CM | POA: Diagnosis present

## 2016-08-10 DIAGNOSIS — R1011 Right upper quadrant pain: Secondary | ICD-10-CM

## 2016-08-10 DIAGNOSIS — E876 Hypokalemia: Secondary | ICD-10-CM | POA: Diagnosis present

## 2016-08-10 DIAGNOSIS — F17210 Nicotine dependence, cigarettes, uncomplicated: Secondary | ICD-10-CM

## 2016-08-10 DIAGNOSIS — M1990 Unspecified osteoarthritis, unspecified site: Secondary | ICD-10-CM | POA: Diagnosis present

## 2016-08-10 DIAGNOSIS — J45909 Unspecified asthma, uncomplicated: Secondary | ICD-10-CM | POA: Diagnosis present

## 2016-08-10 DIAGNOSIS — G8929 Other chronic pain: Secondary | ICD-10-CM | POA: Diagnosis present

## 2016-08-10 DIAGNOSIS — K859 Acute pancreatitis without necrosis or infection, unspecified: Secondary | ICD-10-CM

## 2016-08-10 DIAGNOSIS — Z7982 Long term (current) use of aspirin: Secondary | ICD-10-CM

## 2016-08-10 DIAGNOSIS — Z79899 Other long term (current) drug therapy: Secondary | ICD-10-CM

## 2016-08-10 DIAGNOSIS — E039 Hypothyroidism, unspecified: Secondary | ICD-10-CM | POA: Diagnosis present

## 2016-08-10 DIAGNOSIS — K279 Peptic ulcer, site unspecified, unspecified as acute or chronic, without hemorrhage or perforation: Secondary | ICD-10-CM | POA: Diagnosis present

## 2016-08-10 DIAGNOSIS — K2970 Gastritis, unspecified, without bleeding: Secondary | ICD-10-CM | POA: Diagnosis present

## 2016-08-10 DIAGNOSIS — Z79891 Long term (current) use of opiate analgesic: Secondary | ICD-10-CM

## 2016-08-10 LAB — CBC
HCT: 45.4 % (ref 35.0–47.0)
HCT: 45.4 % (ref 35.0–47.0)
HEMOGLOBIN: 15.6 g/dL (ref 12.0–16.0)
Hemoglobin: 15.7 g/dL (ref 12.0–16.0)
MCH: 38 pg — AB (ref 26.0–34.0)
MCH: 37.7 pg — ABNORMAL HIGH (ref 26.0–34.0)
MCHC: 34.3 g/dL (ref 32.0–36.0)
MCHC: 34.5 g/dL (ref 32.0–36.0)
MCV: 110.8 fL — ABNORMAL HIGH (ref 80.0–100.0)
MCV: 109.5 fL — ABNORMAL HIGH (ref 80.0–100.0)
PLATELETS: 162 10*3/uL (ref 150–440)
Platelets: 168 10*3/uL (ref 150–440)
RBC: 4.1 MIL/uL (ref 3.80–5.20)
RBC: 4.15 MIL/uL (ref 3.80–5.20)
RDW: 17.5 % — AB (ref 11.5–14.5)
RDW: 17.6 % — AB (ref 11.5–14.5)
WBC: 10.1 10*3/uL (ref 3.6–11.0)
WBC: 10.1 10*3/uL (ref 3.6–11.0)

## 2016-08-10 LAB — URINALYSIS COMPLETE WITH MICROSCOPIC (ARMC ONLY)
BACTERIA UA: NONE SEEN
Bilirubin Urine: NEGATIVE
GLUCOSE, UA: NEGATIVE mg/dL
Ketones, ur: NEGATIVE mg/dL
Leukocytes, UA: NEGATIVE
Nitrite: NEGATIVE
PROTEIN: 30 mg/dL — AB
Specific Gravity, Urine: 1.023 (ref 1.005–1.030)
pH: 5 (ref 5.0–8.0)

## 2016-08-10 LAB — COMPREHENSIVE METABOLIC PANEL
ALBUMIN: 3.9 g/dL (ref 3.5–5.0)
ALK PHOS: 130 U/L — AB (ref 38–126)
ALT: 24 U/L (ref 14–54)
ALT: 23 U/L (ref 14–54)
ANION GAP: 8 (ref 5–15)
AST: 43 U/L — AB (ref 15–41)
AST: 36 U/L (ref 15–41)
Albumin: 3.9 g/dL (ref 3.5–5.0)
Alkaline Phosphatase: 125 U/L (ref 38–126)
Anion gap: 8 (ref 5–15)
BILIRUBIN TOTAL: 0.6 mg/dL (ref 0.3–1.2)
BILIRUBIN TOTAL: 1 mg/dL (ref 0.3–1.2)
BUN: 9 mg/dL (ref 6–20)
BUN: 9 mg/dL (ref 6–20)
CALCIUM: 8.8 mg/dL — AB (ref 8.9–10.3)
CO2: 25 mmol/L (ref 22–32)
CO2: 25 mmol/L (ref 22–32)
CREATININE: 0.74 mg/dL (ref 0.44–1.00)
Calcium: 8.5 mg/dL — ABNORMAL LOW (ref 8.9–10.3)
Chloride: 106 mmol/L (ref 101–111)
Chloride: 104 mmol/L (ref 101–111)
Creatinine, Ser: 0.74 mg/dL (ref 0.44–1.00)
GFR calc Af Amer: >60 mL/min (ref >60)
GFR calc non Af Amer: >60 mL/min (ref >60)
GLUCOSE: 99 mg/dL (ref 65–99)
Glucose, Bld: 103 mg/dL — ABNORMAL HIGH (ref 65–99)
Potassium: 3.4 mmol/L — ABNORMAL LOW (ref 3.5–5.1)
Potassium: 3.1 mmol/L — ABNORMAL LOW (ref 3.5–5.1)
SODIUM: 139 mmol/L (ref 135–145)
Sodium: 137 mmol/L (ref 135–145)
TOTAL PROTEIN: 7.3 g/dL (ref 6.5–8.1)
TOTAL PROTEIN: 7 g/dL (ref 6.5–8.1)

## 2016-08-10 LAB — LIPASE, BLOOD
LIPASE: 1250 U/L — AB (ref 11–51)
Lipase: 304 U/L — ABNORMAL HIGH (ref 11–51)

## 2016-08-10 LAB — POCT PREGNANCY, URINE: PREG TEST UR: NEGATIVE

## 2016-08-10 MED ORDER — ONDANSETRON HCL 4 MG/2ML IJ SOLN
4.0000 mg | Freq: Once | INTRAMUSCULAR | Status: AC
  Administered 2016-08-10: 4 mg via INTRAVENOUS
  Filled 2016-08-10: qty 2

## 2016-08-10 MED ORDER — GI COCKTAIL ~~LOC~~
30.0000 mL | Freq: Once | ORAL | Status: AC
  Administered 2016-08-10: 30 mL via ORAL
  Filled 2016-08-10: qty 30

## 2016-08-10 MED ORDER — HYDROMORPHONE HCL 1 MG/ML IJ SOLN
1.0000 mg | Freq: Once | INTRAMUSCULAR | Status: AC
  Administered 2016-08-10: 1 mg via INTRAVENOUS
  Filled 2016-08-10: qty 1

## 2016-08-10 MED ORDER — HYDROMORPHONE HCL 1 MG/ML IJ SOLN
0.5000 mg | Freq: Once | INTRAMUSCULAR | Status: AC
  Administered 2016-08-10: 0.5 mg via INTRAVENOUS
  Filled 2016-08-10: qty 1

## 2016-08-10 MED ORDER — ONDANSETRON HCL 4 MG PO TABS: 4.0000 mg | ORAL_TABLET | Freq: Three times a day (TID) | ORAL | 0 refills | Status: DC | PRN

## 2016-08-10 MED ORDER — HYDROMORPHONE HCL 2 MG PO TABS: 2.0000 mg | ORAL_TABLET | Freq: Two times a day (BID) | ORAL | 0 refills | Status: DC | PRN

## 2016-08-10 MED ORDER — PANTOPRAZOLE SODIUM 20 MG PO TBEC: 20.0000 mg | DELAYED_RELEASE_TABLET | Freq: Every day | ORAL | 1 refills | Status: DC

## 2016-08-10 MED ORDER — SODIUM CHLORIDE 0.9 % IV SOLN
Freq: Once | INTRAVENOUS | Status: AC
  Administered 2016-08-10: via INTRAVENOUS

## 2016-08-10 MED ORDER — SODIUM CHLORIDE 0.9 % IV BOLUS (SEPSIS)
500.0000 mL | Freq: Once | INTRAVENOUS | Status: AC
  Administered 2016-08-10: 500 mL via INTRAVENOUS

## 2016-08-10 NOTE — ED Notes (Signed)
Patient transported to Ultrasound 

## 2016-08-10 NOTE — ED Provider Notes (Signed)
Time Seen: Approximately 1530 I have reviewed the triage notes  Chief Complaint: Abdominal Pain   History of Present Illness: Terri Mann is a 40 y.o. female *who states that she's had epigastric and right upper quadrant abdominal pain now for the last 4 days. She's had similar episodes before in the past and was recently here at the end of July and had a right upper quadrant ultrasound and abdominal pelvic CT with contrast does not identify any pathology. Patient states she's been taking Goody's at home for the discomfort. She's been nauseated with a decreased appetite but no persistent vomiting. She denies any fever or blood in her stool. She denies much in way of left-sided or midline back pain but will radiate toward the right flank area. The risk of pregnancy. She states she hasn't upcoming appointment in 2 weeks with gastroenterology.  Past Medical History:  Diagnosis Date  . Asthma   . Chronic back pain   . Thyroid disease     There are no active problems to display for this patient.   Past Surgical History:  Procedure Laterality Date  . tubial ligation       Past Surgical History:  Procedure Laterality Date  . tubial ligation       Current Outpatient Rx  . Order #: [id number] Class: Print  . Order #: [id number] Class: Print  . Order #: [id number] Class: Print  . Order #: [id number] Class: Print  . Order #: [id number] Class: Print  . Order #: [id number] Class: Print    Allergies:  Review of patient's allergies indicates no known allergies.  Family History: History reviewed. No pertinent family history.  Social History: Social History  Substance Use Topics  . Smoking status: Current Some Day Smoker    Packs/day: 2.00    Types: Cigarettes  . Smokeless tobacco: Never Used  . Alcohol use No     Comment: occs.      Review of Systems:   10 point review of systems was performed and was otherwise negative:  Constitutional: No fever Eyes: No visual  disturbances ENT: No sore throat, ear pain Cardiac: No chest pain Respiratory: No shortness of breath, wheezing, or stridor Abdomen: Pain is constant right upper quadrant toward the right flank area. She denies any right lower quadrant abdominal pain or left-sided abdominal discomfort. Endocrine: No weight loss, No night sweats Extremities: No peripheral edema, cyanosis Skin: No rashes, easy bruising Neurologic: No focal weakness, trouble with speech or swollowing Urologic: No dysuria, Hematuria, or urinary frequency   Physical Exam:  ED Triage Vitals [08/10/16 1342]  Enc Vitals Group     BP (!) 159/80     Pulse Rate 89     Resp 18     Temp 97.8 F (36.6 C)     Temp Source Oral     SpO2 100 %     Weight 175 lb (79.4 kg)     Height 5\' 8"  (1.727 m)     Head Circumference      Peak Flow      Pain Score 10     Pain Loc      Pain Edu?      Excl. in GC?     General: Awake , Alert , and Oriented times 3; GCS 15 Head: Normal cephalic , atraumatic Eyes: Pupils equal , round, reactive to light Nose/Throat: No nasal drainage, patent upper airway without erythema or exudate.  Neck: Supple, Full range of motion, No anterior adenopathy or palpable thyroid masses Lungs:  Clear to ascultation without wheezes , rhonchi, or rales Heart: Regular rate, regular rhythm without murmurs , gallops , or rubs Abdomen: Tender primarily in the right upper quadrant without rebound guarding or rigidity. Negative Murphy's sign. No pain with palpation. No palpable masses or organosplenomegaly..        Extremities: 2 plus symmetric pulses. No edema, clubbing or cyanosis Neurologic: normal ambulation, Motor symmetric without deficits, sensory intact Skin: warm, dry, no rashes   Labs:   All laboratory work was reviewed including any pertinent negatives or positives listed below:  Labs Reviewed  LIPASE, BLOOD - Abnormal; Notable for the following:       Result Value   Lipase 304 (*)    All other  components within normal limits  COMPREHENSIVE METABOLIC PANEL - Abnormal; Notable for the following:    Potassium 3.4 (*)    Calcium 8.8 (*)    AST 43 (*)    Alkaline Phosphatase 130 (*)    All other components within normal limits  CBC - Abnormal; Notable for the following:    MCV 110.8 (*)    MCH 38.0 (*)    RDW 17.5 (*)    All other components within normal limits  URINALYSIS COMPLETEWITH MICROSCOPIC ([facility] ONLY) - Abnormal; Notable for the following:    Color, Urine YELLOW (*)    APPearance CLEAR (*)    Hgb urine dipstick 2+ (*)    Protein, ur 30 (*)    Squamous Epithelial / LPF 0-5 (*)    All other components within normal limits  POC URINE PREG, ED  POCT PREGNANCY, URINE   Patient's lipase appears to be elevated which is a new finding from her previous laboratory work etc. Radiology:  "US Abdomen Limited Ruq  Result Date: 08/10/2016 CLINICAL DATA:  Right upper quadrant pain for 3 days. EXAM: US ABDOMEN LIMITED - RIGHT UPPER QUADRANT COMPARISON:  CT, 06/03/2016 FINDINGS: Gallbladder: No gallstones or wall thickening visualized. No sonographic Murphy sign noted by sonographer. Common bile duct: Diameter: 4.3 mm Liver: Coarsened echotexture with increased echogenicity. Decreased through transmission of the sound beam. No mass or focal lesion. IMPRESSION: 1. No acute finding.  Normal gallbladder.  No bile duct dilation. 2. Hepatic steatosis. Electronically Signed   By: Amie Portland M.D.   On: 08/10/2016 16:07  "  I personally reviewed the radiologic studies    ED Course: Differential diagnosis includes but is not exclusive to acute cholecystitis, intrathoracic causes for epigastric abdominal pain, gastritis, duodenitis, pancreatitis, small bowel or large bowel obstruction, abdominal aortic aneurysm, hernia, gastritis, etc.  Given the patient's current clinical presentation and objective findings appears she has a mild case of pancreatitis. Eyes any history of heavy alcohol  usage but was advised to decrease her occasional drinking the wine and also advised to decrease her smoking. Patient has an upcoming appointment with gastroenterology she was advised to keep that appointment. I repeated her prescription for Protonix and also placed her on Dilaudid and Zofran on an outpatient basis. She was advised to avoid good powders and/or nonsteroidal medication. Clinical Course     Assessment:Acute mild pancreatitis Final Clinical Impression:   Final diagnoses:  Right upper quadrant pain     Plan:  Outpatient " New Prescriptions   HYDROMORPHONE (DILAUDID) 2 MG TABLET    Take 1 tablet (2 mg total) by mouth every 12 (twelve) hours as needed for severe pain.   ONDANSETRON (ZOFRAN) 4 MG TABLET    Take 1 tablet (4 mg total)  by mouth every 8 (eight) hours as needed for nausea or vomiting.   PANTOPRAZOLE (PROTONIX) 20 MG TABLET    Take 1 tablet (20 mg total) by mouth daily.  " Patient was advised to return immediately if condition worsens. Patient was advised to follow up with their primary care physician or other specialized physicians involved in their outpatient care. The patient and/or family member/power of attorney had laboratory results reviewed at the bedside. All questions and concerns were addressed and appropriate discharge instructions were distributed by the nursing staff.             Jennye MoccasinBrian S Seryna Marek, MD 08/10/16 [phone]

## 2016-08-10 NOTE — ED Notes (Signed)
Verbal report given to Dawn, RN 

## 2016-08-10 NOTE — ED Provider Notes (Signed)
[facility] Emergency Department Provider Note   ____________________________________________   First MD Initiated Contact with Patient 08/10/16 2323     (approximate)  I have reviewed the triage vital signs and the nursing notes.   HISTORY  Chief Complaint Abdominal Pain   HPI Terri Mann is a 40 y.o. female who was seen earlier today and diagnosed with pancreatitis. She reports the pain was gone perfectly fine when she left but she went home and took 1 Protonix and pain flared up and is now really quite severe. She has nausea pain is now radiating through to the back achy pain think seems to make it better or worse again it started earlier today   Past Medical History:  Diagnosis Date  . Asthma   . Chronic back pain   . Thyroid disease     There are no active problems to display for this patient.   Past Surgical History:  Procedure Laterality Date  . tubial ligation       Prior to Admission medications   Medication Sig Start Date End Date Taking? Authorizing Provider  Aspirin-Salicylamide-Caffeine (BC HEADACHE POWDER PO) Take 1 Package by mouth as needed.   Yes Historical Provider, MD  cetirizine (ZYRTEC) 10 MG tablet Take 10 mg by mouth daily.   Yes Historical Provider, MD  HYDROmorphone (DILAUDID) 2 MG tablet Take 1 tablet (2 mg total) by mouth every 12 (twelve) hours as needed for severe pain. 08/10/16  Yes Jennye Moccasin, MD  levothyroxine (SYNTHROID, LEVOTHROID) 100 MCG tablet Take 100 mcg by mouth daily before breakfast.   Yes Historical Provider, MD  ondansetron (ZOFRAN) 4 MG tablet Take 1 tablet (4 mg total) by mouth every 8 (eight) hours as needed for nausea or vomiting. 08/10/16  Yes Jennye Moccasin, MD  pantoprazole (PROTONIX) 20 MG tablet Take 1 tablet (20 mg total) by mouth daily. 08/10/16 08/10/17 Yes Jennye Moccasin, MD  ranitidine (ZANTAC) 150 MG tablet Take 150 mg by mouth 2 (two) times daily.   Yes Historical Provider, MD     Allergies Review of patient's allergies indicates no known allergies.  History reviewed. No pertinent family history.  Social History Social History  Substance Use Topics  . Smoking status: Current Some Day Smoker    Packs/day: 2.00    Types: Cigarettes  . Smokeless tobacco: Never Used  . Alcohol use No     Comment: occs.     Review of Systems Constitutional: No fever/chills Eyes: No visual changes. ENT: No sore throat. Cardiovascular: Denies chest pain. Respiratory: Denies shortness of breath. Gastrointestinal:The history of present illness Genitourinary: Negative for dysuria. Musculoskeletal: See history of present illness Skin: Negative for rash. Neurological: Negative for headaches, focal weakness or numbness.  10-point ROS otherwise negative.  ____________________________________________   PHYSICAL EXAM:  VITAL SIGNS: ED Triage Vitals [08/10/16 2219]  Enc Vitals Group     BP 132/79     Pulse Rate 92     Resp (!) 22     Temp 97.8 F (36.6 C)     Temp Source Oral     SpO2 98 %     Weight 175 lb (79.4 kg)     Height 5\' 8"  (1.727 m)     Head Circumference      Peak Flow      Pain Score 10     Pain Loc      Pain Edu?      Excl. in GC?  Constitutional: Alert and oriented. Well appearing and in no acute distress. Eyes: Conjunctivae are normal. PERRL. EOMI. Head: Atraumatic. Nose: No congestion/rhinnorhea. Mouth/Throat: Mucous membranes are moist.  Oropharynx non-erythematous. Neck: No stridor.   Cardiovascular: Normal rate, regular rhythm. Grossly normal heart sounds.  Good peripheral circulation. Respiratory: Normal respiratory effort.  No retractions. Lungs CTAB. Gastrointestinal: SoftAnd very tender especially in the midabdomen markedly decreased bowel sounds No distention. No abdominal bruits. No CVA tenderness. Musculoskeletal: No lower extremity tenderness nor edema.  No joint effusions. Neurologic:  Normal speech and language. No gross  focal neurologic deficits are appreciated. No gait instability. Skin:  Skin is warm, dry and intact. No rash noted.  ____________________________________________   LABS (all labs ordered are listed, but only abnormal results are displayed)  Labs Reviewed  LIPASE, BLOOD - Abnormal; Notable for the following:       Result Value   Lipase 1,250 (*)    All other components within normal limits  COMPREHENSIVE METABOLIC PANEL - Abnormal; Notable for the following:    Potassium 3.1 (*)    Glucose, Bld 103 (*)    Calcium 8.5 (*)    All other components within normal limits  CBC - Abnormal; Notable for the following:    MCV 109.5 (*)    MCH 37.7 (*)    RDW 17.6 (*)    All other components within normal limits   ____________________________________________  EKG   ____________________________________________  RADIOLOGY   ____________________________________________   PROCEDURES  Procedure(s) performed  Procedures  Critical Care performed:  ____________________________________________   INITIAL IMPRESSION / ASSESSMENT AND PLAN / ED COURSE  Pertinent labs & imaging results that were available during my care of the patient were reviewed by me and considered in my medical decision making (see chart for details).   Clinical Course     ____________________________________________   FINAL CLINICAL IMPRESSION(S) / ED DIAGNOSES  Final diagnoses:  Acute pancreatitis, unspecified pancreatitis type      NEW MEDICATIONS STARTED DURING THIS VISIT:  New Prescriptions   No medications on file     Note:  This document was prepared using Dragon voice recognition software and may include unintentional dictation errors.    Arnaldo NatalPaul F Malinda, MD 08/11/16 [phone]

## 2016-08-10 NOTE — ED Triage Notes (Signed)
Pt to ed with c/o upper abd pain, nausea.  Pt states she has not ate in 3 days because every time she eats the pain increases.  Pt denies diarrhea or constipation.

## 2016-08-10 NOTE — Discharge Instructions (Signed)
Please rest the bowel and decrease fatty food intake. Decrease smoking and alcohol consumption. He can take over-the-counter Tylenol for pain. You can take up to 2 extra strength Tylenol every 6 hours. Avoid aspirin and nonsteroidal medications such as Motrin. Return to the emergency department especially for uncontrolled pain, uncontrolled vomiting, fever, dark tarry stool, or any other new concerns. Continue with her outpatient follow-up with gastroenterology and he may want to try to up her appointment.

## 2016-08-10 NOTE — ED Triage Notes (Signed)
Pt seen here earlier today and diagnosed with pancreatitis; discharged home with RX for dilaudid; pt says she got that filled, took a pill around 2030; currently pain is 10/10; pt visibly shaking in triage; says she was given medication for nausea and currently denies; pt's son says "this has been going on for a few months now"

## 2016-08-10 NOTE — ED Notes (Signed)
Patient reports she was seen earlier in the day and diagnosed with pancreatitis.  Reports discharge instruction explained about avoiding alcohol, spicy foods, bc powders and nsaids.  Patient reports being pain free and nauses free on discharge.  Patient reports getting prescriptions filled, took Dilaudid at approximately 8:15 pm for pain and has had no relief since.  Patient visibly shaking in room and reports that abdomen is tender to touch.

## 2016-08-11 DIAGNOSIS — Z79899 Other long term (current) drug therapy: Secondary | ICD-10-CM | POA: Diagnosis not present

## 2016-08-11 DIAGNOSIS — K859 Acute pancreatitis without necrosis or infection, unspecified: Secondary | ICD-10-CM | POA: Diagnosis present

## 2016-08-11 DIAGNOSIS — K279 Peptic ulcer, site unspecified, unspecified as acute or chronic, without hemorrhage or perforation: Secondary | ICD-10-CM | POA: Diagnosis present

## 2016-08-11 DIAGNOSIS — E039 Hypothyroidism, unspecified: Secondary | ICD-10-CM | POA: Diagnosis present

## 2016-08-11 DIAGNOSIS — K2970 Gastritis, unspecified, without bleeding: Secondary | ICD-10-CM | POA: Diagnosis present

## 2016-08-11 DIAGNOSIS — M1990 Unspecified osteoarthritis, unspecified site: Secondary | ICD-10-CM | POA: Diagnosis present

## 2016-08-11 DIAGNOSIS — E876 Hypokalemia: Secondary | ICD-10-CM | POA: Diagnosis present

## 2016-08-11 DIAGNOSIS — Z7982 Long term (current) use of aspirin: Secondary | ICD-10-CM | POA: Diagnosis not present

## 2016-08-11 DIAGNOSIS — F17210 Nicotine dependence, cigarettes, uncomplicated: Secondary | ICD-10-CM | POA: Diagnosis present

## 2016-08-11 DIAGNOSIS — J45909 Unspecified asthma, uncomplicated: Secondary | ICD-10-CM | POA: Diagnosis present

## 2016-08-11 DIAGNOSIS — G8929 Other chronic pain: Secondary | ICD-10-CM | POA: Diagnosis present

## 2016-08-11 DIAGNOSIS — K219 Gastro-esophageal reflux disease without esophagitis: Secondary | ICD-10-CM | POA: Diagnosis present

## 2016-08-11 DIAGNOSIS — Z79891 Long term (current) use of opiate analgesic: Secondary | ICD-10-CM | POA: Diagnosis not present

## 2016-08-11 LAB — PHOSPHORUS: PHOSPHORUS: 3.1 mg/dL (ref 2.5–4.6)

## 2016-08-11 LAB — CBC
HCT: 39.1 % (ref 35.0–47.0)
HEMOGLOBIN: 13.7 g/dL (ref 12.0–16.0)
MCH: 38.3 pg — ABNORMAL HIGH (ref 26.0–34.0)
MCHC: 35 g/dL (ref 32.0–36.0)
MCV: 109.2 fL — ABNORMAL HIGH (ref 80.0–100.0)
PLATELETS: 125 10*3/uL — AB (ref 150–440)
RBC: 3.58 MIL/uL — AB (ref 3.80–5.20)
RDW: 17.1 % — ABNORMAL HIGH (ref 11.5–14.5)
WBC: 8.7 10*3/uL (ref 3.6–11.0)

## 2016-08-11 LAB — BASIC METABOLIC PANEL
ANION GAP: 9 (ref 5–15)
BUN: 7 mg/dL (ref 6–20)
CALCIUM: 7.9 mg/dL — AB (ref 8.9–10.3)
CO2: 22 mmol/L (ref 22–32)
Chloride: 105 mmol/L (ref 101–111)
Creatinine, Ser: 0.6 mg/dL (ref 0.44–1.00)
Glucose, Bld: 99 mg/dL (ref 65–99)
Potassium: 3.5 mmol/L (ref 3.5–5.1)
SODIUM: 136 mmol/L (ref 135–145)

## 2016-08-11 LAB — MAGNESIUM: MAGNESIUM: 1.5 mg/dL — AB (ref 1.7–2.4)

## 2016-08-11 LAB — TRIGLYCERIDES: TRIGLYCERIDES: 47 mg/dL (ref <150)

## 2016-08-11 MED ORDER — SODIUM CHLORIDE 0.9 % IV SOLN
INTRAVENOUS | Status: DC
  Administered 2016-08-11 (×2): via INTRAVENOUS

## 2016-08-11 MED ORDER — POTASSIUM CHLORIDE 10 MEQ/100ML IV SOLN
10.0000 meq | INTRAVENOUS | Status: AC
  Administered 2016-08-11 (×4): 10 meq via INTRAVENOUS
  Filled 2016-08-11 (×4): qty 100

## 2016-08-11 MED ORDER — ONDANSETRON HCL 4 MG/2ML IJ SOLN: 4.0000 mg | Freq: Four times a day (QID) | INTRAMUSCULAR | Status: DC | PRN

## 2016-08-11 MED ORDER — MAGNESIUM CITRATE PO SOLN: 1.0000 | Freq: Once | ORAL | Status: DC | PRN

## 2016-08-11 MED ORDER — ZOLPIDEM TARTRATE 5 MG PO TABS: 5.0000 mg | ORAL_TABLET | Freq: Every evening | ORAL | Status: DC | PRN

## 2016-08-11 MED ORDER — ONDANSETRON HCL 4 MG PO TABS: 4.0000 mg | ORAL_TABLET | Freq: Four times a day (QID) | ORAL | Status: DC | PRN

## 2016-08-11 MED ORDER — BISACODYL 5 MG PO TBEC
5.0000 mg | DELAYED_RELEASE_TABLET | Freq: Every day | ORAL | Status: DC | PRN
  Administered 2016-08-13: 5 mg via ORAL
  Filled 2016-08-11: qty 1

## 2016-08-11 MED ORDER — ACETAMINOPHEN 325 MG PO TABS: 650.0000 mg | ORAL_TABLET | Freq: Four times a day (QID) | ORAL | Status: DC | PRN

## 2016-08-11 MED ORDER — FAMOTIDINE 20 MG PO TABS
20.0000 mg | ORAL_TABLET | Freq: Two times a day (BID) | ORAL | Status: DC
  Administered 2016-08-11 – 2016-08-12 (×3): 20 mg via ORAL
  Filled 2016-08-11 (×3): qty 1

## 2016-08-11 MED ORDER — SENNOSIDES-DOCUSATE SODIUM 8.6-50 MG PO TABS: 1.0000 | ORAL_TABLET | Freq: Every evening | ORAL | Status: DC | PRN

## 2016-08-11 MED ORDER — MORPHINE SULFATE (PF) 2 MG/ML IV SOLN
1.0000 mg | INTRAVENOUS | Status: DC | PRN
Start: 2016-08-11 — End: 2016-08-13
  Administered 2016-08-11 – 2016-08-13 (×8): 1 mg via INTRAVENOUS
  Filled 2016-08-11 (×8): qty 1

## 2016-08-11 MED ORDER — ENOXAPARIN SODIUM 40 MG/0.4ML ~~LOC~~ SOLN: 40.0000 mg | Freq: Every day | SUBCUTANEOUS | Status: DC

## 2016-08-11 MED ORDER — LORATADINE 10 MG PO TABS
10.0000 mg | ORAL_TABLET | Freq: Every day | ORAL | Status: DC
  Administered 2016-08-11 – 2016-08-13 (×3): 10 mg via ORAL
  Filled 2016-08-11 (×3): qty 1

## 2016-08-11 MED ORDER — LEVOTHYROXINE SODIUM 50 MCG PO TABS
100.0000 ug | ORAL_TABLET | Freq: Every day | ORAL | Status: DC
  Administered 2016-08-11 – 2016-08-13 (×3): 100 ug via ORAL
  Filled 2016-08-11 (×3): qty 2

## 2016-08-11 MED ORDER — SODIUM CHLORIDE 0.9% FLUSH
3.0000 mL | Freq: Two times a day (BID) | INTRAVENOUS | Status: DC
  Administered 2016-08-11 – 2016-08-13 (×3): 3 mL via INTRAVENOUS

## 2016-08-11 MED ORDER — ACETAMINOPHEN 650 MG RE SUPP: 650.0000 mg | Freq: Four times a day (QID) | RECTAL | Status: DC | PRN

## 2016-08-11 MED ORDER — PANTOPRAZOLE SODIUM 40 MG PO TBEC
40.0000 mg | DELAYED_RELEASE_TABLET | Freq: Every day | ORAL | Status: DC
  Administered 2016-08-11 – 2016-08-12 (×2): 40 mg via ORAL
  Filled 2016-08-11 (×2): qty 1

## 2016-08-11 NOTE — H&P (Signed)
[facility] [place] @ [facility] Admission History and Physical [employer], D.O.  ---------------------------------------------------------------------------------------------------------------------   PATIENT NAME: Terri Mann MR#: [id number] DATE OF BIRTH: [birth year] DATE OF ADMISSION: 08/10/2016 PRIMARY CARE PHYSICIAN: [facility]  REQUESTING/REFERRING PHYSICIAN: ED Dr. Huel Cote  CHIEF COMPLAINT: Chief Complaint  Patient presents with  . Abdominal Pain    HISTORY OF PRESENT ILLNESS: Terri Mann is a 40 y.o. female with a known history of Asthma, chronic back pain and hypothyroidism presents to the emergency department complaining of abdominal pain.  Patient reports a long recent history of abdominal pain most often localized to the right upper quadrant and right flank. She has had several episodes in the past few months but comes in today with worsening severe pain described as burning in the mid epigastrium. She reports associated nausea and generalized fatigue.She reports that she has been taking BC tablets 5-6 times a day for the past 10 or more years. She states that she started taking them for arthritis pains and has become dependent on them. She has not yet seen a gastroenterologist but has plans to in the coming weeks. She has had multiple emergency department visits recently for the same symptoms including one earlier today where she was discharged home. She returns this evening for refractory pain  Otherwise there has been no change in status. Patient has been taking medication as prescribed and there has been no recent change in medication or diet.  There has been no recent illness, travel or sick contacts.    Patient denies fevers/chills, weakness, dizziness, chest pain, shortness of breath, dysuria/frequency, changes in mental status.   EMS/ED COURSE:   Patient received Dilaudid and IV fluids.  PAST MEDICAL HISTORY: Past Medical History:   Diagnosis Date  . Asthma   . Chronic back pain   . Thyroid disease       PAST SURGICAL HISTORY: Past Surgical History:  Procedure Laterality Date  . tubial ligation         SOCIAL HISTORY: Social History  Substance Use Topics  . Smoking status: Current Some Day Smoker    Packs/day: 2.00    Types: Cigarettes  . Smokeless tobacco: Never Used  . Alcohol use No     Comment: occs.       FAMILY HISTORY: History reviewed. No pertinent family history.   MEDICATIONS AT HOME: Prior to Admission medications   Medication Sig Start Date End Date Taking? Authorizing Provider  Aspirin-Salicylamide-Caffeine (BC HEADACHE POWDER PO) Take 1 Package by mouth as needed.   Yes Historical Provider, MD  cetirizine (ZYRTEC) 10 MG tablet Take 10 mg by mouth daily.   Yes Historical Provider, MD  HYDROmorphone (DILAUDID) 2 MG tablet Take 1 tablet (2 mg total) by mouth every 12 (twelve) hours as needed for severe pain. 08/10/16  Yes Jennye Moccasin, MD  levothyroxine (SYNTHROID, LEVOTHROID) 100 MCG tablet Take 100 mcg by mouth daily before breakfast.   Yes Historical Provider, MD  ondansetron (ZOFRAN) 4 MG tablet Take 1 tablet (4 mg total) by mouth every 8 (eight) hours as needed for nausea or vomiting. 08/10/16  Yes Jennye Moccasin, MD  pantoprazole (PROTONIX) 20 MG tablet Take 1 tablet (20 mg total) by mouth daily. 08/10/16 08/10/17 Yes Jennye Moccasin, MD  ranitidine (ZANTAC) 150 MG tablet Take 150 mg by mouth 2 (two) times daily.   Yes Historical Provider, MD      DRUG ALLERGIES: No Known Allergies   REVIEW OF SYSTEMS: CONSTITUTIONAL: No  fatigue, weakness, fever, chills, weight gain/loss, headache EYES: No blurry or double vision. ENT: No tinnitus, postnasal drip, redness or soreness of the oropharynx. RESPIRATORY: No dyspnea, cough, wheeze, hemoptysis. CARDIOVASCULAR: No chest pain, orthopnea, palpitations, syncope. GASTROINTESTINAL: Positive nausea, negative vomiting, constipation,  diarrhea, positive abdominal pain. No hematemesis, melena or hematochezia. GENITOURINARY: No dysuria, frequency, hematuria. ENDOCRINE: No polyuria or nocturia. No heat or cold intolerance. HEMATOLOGY: No anemia, bruising, bleeding. INTEGUMENTARY: No rashes, ulcers, lesions. MUSCULOSKELETAL: No pain, arthritis, swelling, gout. NEUROLOGIC: No numbness, tingling, weakness or ataxia. No seizure-type activity. PSYCHIATRIC: No anxiety, depression, insomnia.  PHYSICAL EXAMINATION: VITAL SIGNS: Blood pressure 132/73, pulse 87, temperature 97.9 F (36.6 C), temperature source Oral, resp. rate 18, height 5' 7.5" (1.715 m), weight 84.5 kg (186 lb 4.8 oz), last menstrual period 08/10/2016, SpO2 100 %.  GENERAL: 40 y.o.-year-old white female patient, well-developed, well-nourished lying in the bed in no acute distress.  Pleasant and cooperative.   HEENT: Head atraumatic, normocephalic. Pupils equal, round, reactive to light and accommodation. No scleral icterus. Extraocular muscles intact. Oropharynx is clear. Mucus membranes moist. NECK: Supple, full range of motion. No JVD, no bruit heard. No cervical lymphadenopathy. CHEST: Normal breath sounds bilaterally. No wheezing, rales, rhonchi or crackles. No use of accessory muscles of respiration.  No reproducible chest wall tenderness.  CARDIOVASCULAR: S1, S2 normal. No murmurs, rubs, or gallops appreciated. Cap refill <2 seconds. ABDOMEN: Soft, diffuse tenderness worse at the midepigastrium nondistended. No rebound, guarding, rigidity. Normoactive bowel sounds present in all four quadrants. No organomegaly or mass. EXTREMITIES: Full range of motion. No pedal edema, cyanosis, or clubbing. NEUROLOGIC: Cranial nerves II through XII are grossly intact with no focal sensorimotor deficit. Muscle strength 5/5 in all extremities. Sensation intact. Gait not checked. PSYCHIATRIC: The patient is alert and oriented x 3. Normal affect, mood, thought content. SKIN: Warm,  dry, and intact without obvious rash, lesion, or ulcer.  LABORATORY PANEL:  CBC  Recent Labs Lab 08/10/16 2225  WBC 10.1  HGB 15.7  HCT 45.4  PLT 162   ----------------------------------------------------------------------------------------------------------------- Chemistries  Recent Labs Lab 08/10/16 2225  NA 137  K 3.1*  CL 104  CO2 25  GLUCOSE 103*  BUN 9  CREATININE 0.74  CALCIUM 8.5*  AST 36  ALT 23  ALKPHOS 125  BILITOT 1.0   ------------------------------------------------------------------------------------------------------------------ Cardiac Enzymes No results for input(s): TROPONINI in the last 168 hours. ------------------------------------------------------------------------------------------------------------------  RADIOLOGY: [place] Abdomen Limited Ruq  Result Date: 08/10/2016 CLINICAL DATA:  Right upper quadrant pain for 3 days. EXAM: US ABDOMEN LIMITED - RIGHT UPPER QUADRANT COMPARISON:  CT, 06/03/2016 FINDINGS: Gallbladder: No gallstones or wall thickening visualized. No sonographic Murphy sign noted by sonographer. Common bile duct: Diameter: 4.3 mm Liver: Coarsened echotexture with increased echogenicity. Decreased through transmission of the sound beam. No mass or focal lesion. IMPRESSION: 1. No acute finding.  Normal gallbladder.  No bile duct dilation. 2. Hepatic steatosis. Electronically Signed   By: Amie Portlandavid  Ormond M.D.   On: 08/10/2016 16:07    IMPRESSION AND PLAN:  This is a 40 y.o. female with a history of arthritis, hypothyroidism and chronic back pain now being admitted with: 1. Pancreatitis, mild with intractable pain and nausea-admit for observation, IV fluids, nothing by mouth, antacids, pain control, antiemetics and GI consultation given chronicity of the patient's pain. She'll most likely need an endoscopy at some point. Patient has been educated on cessation of BC tablets 2. Hypokalemia-we'll replace by vein 3. History of  hypothyroidism-continue Synthroid 4. History of GERD-continue  Protonix and Zantac 5. History of allergies-continue Zyrtec   Diet/Nutrition: Nothing by mouth Fluids: IV normal saline with K DVT Px: SCDs and early ambulation Code Status: Full  All the records are reviewed and case discussed with ED provider. Management plans discussed with the patient and/or family who express understanding and agree with plan of care.   TOTAL TIME TAKING CARE OF THIS PATIENT: 60 minutes.   Amairani Shuey D.O. on 08/11/2016 at 1:27 AM Between 7am to 6pm - Pager - [phone] After 6pm go to [email or web address] - Social research officer, government [facility] [place] [facility] Office [phone] CC: Primary care physician; [facility]     Note: This dictation was prepared with Dragon dictation along with smaller phrase technology. Any transcriptional errors that result from this process are unintentional.

## 2016-08-11 NOTE — Progress Notes (Signed)
[facility] [place] at [facility]   PATIENT NAME: Terri Mann    MRN#:  [id number]  DATE OF BIRTH:  01/04/1976  SUBJECTIVE:  Hospital Day: 0 days Terri Mann [place] is a 40 y.o. female presenting with Abdominal Pain .   Overnight events: No acute overnight events Interval Events: Still have complaints of epigastric pain described more as soreness 4/10 intensity  REVIEW OF SYSTEMS:  CONSTITUTIONAL: No fever, fatigue or weakness.  EYES: No blurred or double vision.  EARS, NOSE, AND THROAT: No tinnitus or ear pain.  RESPIRATORY: No cough, shortness of breath, wheezing or hemoptysis.  CARDIOVASCULAR: No chest pain, orthopnea, edema.  GASTROINTESTINAL: No nausea, vomiting, diarrhea positive abdominal pain.  GENITOURINARY: No dysuria, hematuria.  ENDOCRINE: No polyuria, nocturia,  HEMATOLOGY: No anemia, easy bruising or bleeding SKIN: No rash or lesion. MUSCULOSKELETAL: No joint pain or arthritis.   NEUROLOGIC: No tingling, numbness, weakness.  PSYCHIATRY: No anxiety or depression.   DRUG ALLERGIES:  No Known Allergies  VITALS:  Blood pressure 128/72, pulse 84, temperature 98.2 F (36.8 C), temperature source Oral, resp. rate 18, height 5' 7.5" (1.715 m), weight 84.5 kg (186 lb 4.8 oz), last menstrual period 08/10/2016, SpO2 98 %.  PHYSICAL EXAMINATION:  VITAL SIGNS: Vitals:   08/11/16 0426 08/11/16 0802  BP: 136/75 128/72  Pulse: 96 84  Resp: 19 18  Temp: 98.7 F (37.1 C) 98.2 F (36.8 C)   GENERAL:40 y.o.female currently in no acute distress.  HEAD: Normocephalic, atraumatic.  EYES: Pupils equal, round, reactive to light. Extraocular muscles intact. No scleral icterus.  MOUTH: Moist mucosal membrane. Dentition intact. No abscess noted.  EAR, NOSE, THROAT: Clear without exudates. No external lesions.  NECK: Supple. No thyromegaly. No nodules. No JVD.  PULMONARY: Clear to ascultation, without wheeze rails or rhonci. No use of accessory muscles,  Good respiratory effort. good air entry bilaterally CHEST: Nontender to palpation.  CARDIOVASCULAR: S1 and S2. Regular rate and rhythm. No murmurs, rubs, or gallops. No edema. Pedal pulses 2+ bilaterally.  GASTROINTESTINAL: Soft, minimal tenderness on palpation, nondistended. No masses. Positive bowel sounds. No hepatosplenomegaly.  MUSCULOSKELETAL: No swelling, clubbing, or edema. Range of motion full in all extremities.  NEUROLOGIC: Cranial nerves II through XII are intact. No gross focal neurological deficits. Sensation intact. Reflexes intact.  SKIN: No ulceration, lesions, rashes, or cyanosis. Skin warm and dry. Turgor intact.  PSYCHIATRIC: Mood, affect within normal limits. The patient is awake, alert and oriented x 3. Insight, judgment intact.      LABORATORY PANEL:   CBC  Recent Labs Lab 08/11/16 0414  WBC 8.7  HGB 13.7  HCT 39.1  PLT 125*   ------------------------------------------------------------------------------------------------------------------  Chemistries   Recent Labs Lab 08/10/16 2225 08/11/16 0414  NA 137 136  K 3.1* 3.5  CL 104 105  CO2 25 22  GLUCOSE 103* 99  BUN 9 7  CREATININE 0.74 0.60  CALCIUM 8.5* 7.9*  MG 1.5*  --   AST 36  --   ALT 23  --   ALKPHOS 125  --   BILITOT 1.0  --    ------------------------------------------------------------------------------------------------------------------  Cardiac Enzymes No results for input(s): TROPONINI in the last 168 hours. ------------------------------------------------------------------------------------------------------------------  RADIOLOGY:  [place] Abdomen Limited Ruq  Result Date: 08/10/2016 CLINICAL DATA:  Right upper quadrant pain for 3 days. EXAM: US ABDOMEN LIMITED - RIGHT UPPER QUADRANT COMPARISON:  CT, 06/03/2016 FINDINGS: Gallbladder: No gallstones or wall thickening visualized. No sonographic Murphy sign noted by sonographer. Common bile duct:  Diameter: 4.3 mm Liver: Coarsened  echotexture with increased echogenicity. Decreased through transmission of the sound beam. No mass or focal lesion. IMPRESSION: 1. No acute finding.  Normal gallbladder.  No bile duct dilation. 2. Hepatic steatosis. Electronically Signed   By: Amie Portland M.D.   On: 08/10/2016 16:07    EKG:   Orders placed or performed in visit on 07/09/15  . EKG 12-Lead    ASSESSMENT AND PLAN:   Terri Mann is a 40 y.o. female presenting with Abdominal Pain . Admitted 08/10/2016 : Day #: 0 days 1. Acute pancreatitis: Unclear etiology abdominal ultrasound performed within normal limits, check triglycerides patient denies alcohol usage-continue IV fluids and nothing by mouth status pain medication 2. Hypokalemia: Replace 3. GERD without esophagitis PPI therapy 4. Hypothyroidism unspecified Synthroid   All the records are reviewed and case discussed with Care Management/Social Workerr. Management plans discussed with the patient, family and they are in agreement.  CODE STATUS: full TOTAL TIME TAKING CARE OF THIS PATIENT: 28 minutes.   POSSIBLE D/C IN 2-3DAYS, DEPENDING ON CLINICAL CONDITION.   Hower,  Terri Mann on 08/11/2016 at 11:11 AM  Between 7am to 6pm - Pager - [phone]  After 6pm: House Pager: - [phone]  [facility]  Office  [phone]  CC: Primary care physician; [facility]

## 2016-08-11 NOTE — Progress Notes (Signed)
Consultation  Referring Provider:     No ref. provider found Primary Care Physician:  [facility] Primary Gastroenterologist:  Dr. Nedra Hai        Reason for Consultation:     Acute pancreatitis  Date of Admission:  08/10/2016 Date of Consultation:  08/11/2016         HPI:   Terri Mann is a 40 y.o. female with acute pancreatitis.She has rare social drink of alcohol and uses BC powders .Negative h/o cholecystectomy.She's noted significant abdominal over recent history.  Past Medical History:  Diagnosis Date  . Asthma   . Chronic back pain   . Thyroid disease     Past Surgical History:  Procedure Laterality Date  . tubial ligation       Prior to Admission medications   Medication Sig Start Date End Date Taking? Authorizing Provider  Aspirin-Salicylamide-Caffeine (BC HEADACHE POWDER PO) Take 1 Package by mouth as needed.   Yes Historical Provider, MD  cetirizine (ZYRTEC) 10 MG tablet Take 10 mg by mouth daily.   Yes Historical Provider, MD  HYDROmorphone (DILAUDID) 2 MG tablet Take 1 tablet (2 mg total) by mouth every 12 (twelve) hours as needed for severe pain. 08/10/16  Yes Jennye Moccasin, MD  levothyroxine (SYNTHROID, LEVOTHROID) 100 MCG tablet Take 100 mcg by mouth daily before breakfast.   Yes Historical Provider, MD  ondansetron (ZOFRAN) 4 MG tablet Take 1 tablet (4 mg total) by mouth every 8 (eight) hours as needed for nausea or vomiting. 08/10/16  Yes Jennye Moccasin, MD  pantoprazole (PROTONIX) 20 MG tablet Take 1 tablet (20 mg total) by mouth daily. 08/10/16 08/10/17 Yes Jennye Moccasin, MD  ranitidine (ZANTAC) 150 MG tablet Take 150 mg by mouth 2 (two) times daily.   Yes Historical Provider, MD    History reviewed. No pertinent family history.   Social History  Substance Use Topics  . Smoking status: Current Some Day Smoker    Packs/day: 2.00    Types: Cigarettes  . Smokeless tobacco: Never Used  . Alcohol use No     Comment: occs.     Allergies as of  08/10/2016  . (No Known Allergies)    Review of Systems:    All systems reviewed and negative except where noted in HPI.   Physical Exam:  Vital signs in last 24 hours: Temp:  [97.8 F (36.6 C)-98.7 F (37.1 C)] 98.1 F (36.7 C) (10/01 1132) Pulse Rate:  [79-97] 85 (10/01 1132) Resp:  [14-22] 18 (10/01 1132) BP: (119-143)/(72-91) 138/79 (10/01 1132) SpO2:  [97 %-100 %] 97 % (10/01 1132) Weight:  [79.4 kg (175 lb)-84.5 kg (186 lb 4.8 oz)] 84.5 kg (186 lb 4.8 oz) (10/01 0116)   General:   Pleasant, cooperative in NAD Head:  Normocephalic and atraumatic. Eyes:   No icterus.   Conjunctiva pink. PERRLA. Ears:  Normal auditory acuity. Neck:  Supple; no masses or thyroidomegaly Lungs: Respirations even and unlabored. Lungs clear to auscultation bilaterally.   No wheezes, crackles, or rhonchi.  Heart:  Regular rate and rhythm;  Without murmur, clicks, rubs or gallops Abdomen:  Soft, nondistended, minimal epigastric tenderness. Normal bowel sounds. No appreciable masses or hepatomegaly.  No rebound or guarding.  Rectal:  Not performed. Msk:  Symmetrical without gross deformities.  Strength - normal.  Extremities:  Without edema, cyanosis or clubbing. Neurologic:  Alert and oriented x3;  grossly normal neurologically. Skin:  Intact without significant lesions or rashes. Cervical Nodes:  No significant cervical adenopathy. Psych:  Alert and cooperative. Normal affect.  LAB RESULTS:  Recent Labs  08/10/16 1347 08/10/16 2225 08/11/16 0414  WBC 10.1 10.1 8.7  HGB 15.6 15.7 13.7  HCT 45.4 45.4 39.1  PLT 168 162 125*   BMET  Recent Labs  08/10/16 1347 08/10/16 2225 08/11/16 0414  NA 139 137 136  K 3.4* 3.1* 3.5  CL 106 104 105  CO2 25 25 22   GLUCOSE 99 103* 99  BUN 9 9 7   CREATININE 0.74 0.74 0.60  CALCIUM 8.8* 8.5* 7.9*   LFT  Recent Labs  08/10/16 2225  PROT 7.0  ALBUMIN 3.9  AST 36  ALT 23  ALKPHOS 125  BILITOT 1.0   PT/INR No results for input(s):  LABPROT, INR in the last 72 hours.  STUDIES: [place] Abdomen Limited Ruq  Result Date: 08/10/2016 CLINICAL DATA:  Right upper quadrant pain for 3 days. EXAM: US ABDOMEN LIMITED - RIGHT UPPER QUADRANT COMPARISON:  CT, 06/03/2016 FINDINGS: Gallbladder: No gallstones or wall thickening visualized. No sonographic Murphy sign noted by sonographer. Common bile duct: Diameter: 4.3 mm Liver: Coarsened echotexture with increased echogenicity. Decreased through transmission of the sound beam. No mass or focal lesion. IMPRESSION: 1. No acute finding.  Normal gallbladder.  No bile duct dilation. 2. Hepatic steatosis. Electronically Signed   By: Amie Portlandavid  Ormond M.D.   On: 08/10/2016 16:07      Impression / Plan:   Terri Mann is a 40 y.o. y/o female with acute pancreatitis /  Plan - Current therapy with slow advancement of diet. MRCP - I will follow. Thank you for involving me in the care of this patient.      LOS: 0 days   Terri Lingoichard Theodor Mustin, MD  08/11/2016, 2:44 PM   Note: This dictation was prepared with Dragon dictation along with smaller phrase technology. Any transcriptional errors that result from this process are unintentional.

## 2016-08-11 NOTE — ED Notes (Signed)
Resting quietly at this time.

## 2016-08-11 NOTE — ED Notes (Signed)
Report called to floor, given to HermanLaurie.

## 2016-08-12 MED ORDER — SUCRALFATE 1 G PO TABS
1.0000 g | ORAL_TABLET | Freq: Three times a day (TID) | ORAL | Status: DC
  Administered 2016-08-12 – 2016-08-13 (×3): 1 g via ORAL
  Filled 2016-08-12 (×3): qty 1

## 2016-08-12 MED ORDER — MAGNESIUM SULFATE 2 GM/50ML IV SOLN
2.0000 g | Freq: Once | INTRAVENOUS | Status: AC
  Administered 2016-08-12: 2 g via INTRAVENOUS
  Filled 2016-08-12: qty 50

## 2016-08-12 MED ORDER — OXYCODONE-ACETAMINOPHEN 5-325 MG PO TABS
1.0000 | ORAL_TABLET | Freq: Four times a day (QID) | ORAL | Status: DC | PRN
  Administered 2016-08-12 – 2016-08-13 (×4): 2 via ORAL
  Filled 2016-08-12 (×4): qty 2

## 2016-08-12 MED ORDER — PANTOPRAZOLE SODIUM 40 MG PO TBEC
40.0000 mg | DELAYED_RELEASE_TABLET | Freq: Two times a day (BID) | ORAL | Status: DC
  Administered 2016-08-12 – 2016-08-13 (×2): 40 mg via ORAL
  Filled 2016-08-12 (×2): qty 1

## 2016-08-12 MED ORDER — POLYETHYLENE GLYCOL 3350 17 G PO PACK
17.0000 g | PACK | Freq: Every day | ORAL | Status: DC
  Administered 2016-08-12 – 2016-08-13 (×2): 17 g via ORAL
  Filled 2016-08-12 (×2): qty 1

## 2016-08-12 NOTE — Progress Notes (Signed)
    Subjective: Much more comfortable today - no abdominal pain  Objective: Vital signs in last 24 hours: Vitals:   08/11/16 1132 08/11/16 1445 08/12/16 0726 08/12/16 1105  BP: 138/79 120/76 127/88 (!) 151/71  Pulse: 85 86 80 77  Resp: 18 18 18 16   Temp: 98.1 F (36.7 C) 99.3 F (37.4 C) 98.1 F (36.7 C)   TempSrc: Oral Oral Oral   SpO2: 97% 95% 99% 100%  Weight:      Height:       Weight change:   Intake/Output Summary (Last 24 hours) at 08/12/16 1418 Last data filed at 08/12/16 1410  Gross per 24 hour  Intake          2492.08 ml  Output                0 ml  Net          2492.08 ml     Exam: Heart:: Regular rate and rhythm Lungs: clear to auscultation and percussion Abdomen: soft, nontender, normal bowel sounds   Lab Results: @LABTEST2 @ Micro Results: No results found for this or any previous visit (from the past 240 hour(s)). Studies/Results: [place] Abdomen Limited Ruq  Result Date: 08/10/2016 CLINICAL DATA:  Right upper quadrant pain for 3 days. EXAM: US ABDOMEN LIMITED - RIGHT UPPER QUADRANT COMPARISON:  CT, 06/03/2016 FINDINGS: Gallbladder: No gallstones or wall thickening visualized. No sonographic Murphy sign noted by sonographer. Common bile duct: Diameter: 4.3 mm Liver: Coarsened echotexture with increased echogenicity. Decreased through transmission of the sound beam. No mass or focal lesion. IMPRESSION: 1. No acute finding.  Normal gallbladder.  No bile duct dilation. 2. Hepatic steatosis. Electronically Signed   By: Amie Portlandavid  Ormond M.D.   On: 08/10/2016 16:07   Medications: I have reviewed the patient's current medications. Scheduled Meds: . famotidine  20 mg Oral BID  . levothyroxine  100 mcg Oral QAC breakfast  . loratadine  10 mg Oral Daily  . pantoprazole  40 mg Oral Daily  . sodium chloride flush  3 mL Intravenous Q12H   Continuous Infusions: . sodium chloride 125 mL/hr at 08/11/16 1841   PRN Meds:.acetaminophen **OR** acetaminophen, bisacodyl,  magnesium citrate, morphine injection, ondansetron **OR** ondansetron (ZOFRAN) IV, senna-docusate, zolpidem   Assessment: Active Problems:   Pancreatitis   Acute pancreatitis    Plan: Supportive care - slow advancement of diet - biliary tract evaluation. I will follow.   LOS: 1 day   Terri Mann 08/12/2016, 2:18 PM

## 2016-08-12 NOTE — Progress Notes (Signed)
[facility] - [place] at [facility]   PATIENT NAME: Terri Mann    MRN#:  [id number]  DATE OF BIRTH:  [birth year]  SUBJECTIVE:  Hospital Day: 1 day Terri Mann is a 40 y.o. female presenting with Abdominal Pain .  Overnight events: No acute overnight events Interval Events: Still have complaints of epigastric pain described more as soreness 4/10 intensity  She asked to try liquid diet today. Tolerated well.  REVIEW OF SYSTEMS:  CONSTITUTIONAL: No fever, fatigue or weakness.  EYES: No blurred or double vision.  EARS, NOSE, AND THROAT: No tinnitus or ear pain.  RESPIRATORY: No cough, shortness of breath, wheezing or hemoptysis.  CARDIOVASCULAR: No chest pain, orthopnea, edema.  GASTROINTESTINAL: No nausea, vomiting, diarrhea positive abdominal pain.  GENITOURINARY: No dysuria, hematuria.  ENDOCRINE: No polyuria, nocturia,  HEMATOLOGY: No anemia, easy bruising or bleeding SKIN: No rash or lesion. MUSCULOSKELETAL: No joint pain or arthritis.   NEUROLOGIC: No tingling, numbness, weakness.  PSYCHIATRY: No anxiety or depression.   DRUG ALLERGIES:  No Known Allergies  VITALS:  Blood pressure (!) 141/87, pulse 98, temperature 98.1 F (36.7 C), temperature source Oral, resp. rate 16, height 5' 7.5" (1.715 m), weight 84.5 kg (186 lb 4.8 oz), last menstrual period 08/10/2016, SpO2 99 %.  PHYSICAL EXAMINATION:  VITAL SIGNS: Vitals:   08/12/16 1105 08/12/16 1602  BP: (!) 151/71 (!) 141/87  Pulse: 77 98  Resp: 16 16  Temp:  98.1 F (36.7 C)   GENERAL:40 y.o.female currently in no acute distress.  HEAD: Normocephalic, atraumatic.  EYES: Pupils equal, round, reactive to light. Extraocular muscles intact. No scleral icterus.  MOUTH: Moist mucosal membrane. Dentition intact. No abscess noted.  EAR, NOSE, THROAT: Clear without exudates. No external lesions.  NECK: Supple. No thyromegaly. No nodules. No JVD.  PULMONARY: Clear to ascultation, without wheeze  rails or rhonci. No use of accessory muscles, Good respiratory effort. good air entry bilaterally CHEST: Nontender to palpation.  CARDIOVASCULAR: S1 and S2. Regular rate and rhythm. No murmurs, rubs, or gallops. No edema. Pedal pulses 2+ bilaterally.  GASTROINTESTINAL: Soft, minimal tenderness on palpation, nondistended. No masses. Positive bowel sounds. No hepatosplenomegaly.  MUSCULOSKELETAL: No swelling, clubbing, or edema. Range of motion full in all extremities.  NEUROLOGIC: Cranial nerves II through XII are intact. No gross focal neurological deficits. Sensation intact. Reflexes intact.  SKIN: No ulceration, lesions, rashes, or cyanosis. Skin warm and dry. Turgor intact.  PSYCHIATRIC: Mood, affect within normal limits. The patient is awake, alert and oriented x 3. Insight, judgment intact.      LABORATORY PANEL:   CBC  Recent Labs Lab 08/11/16 0414  WBC 8.7  HGB 13.7  HCT 39.1  PLT 125*   ------------------------------------------------------------------------------------------------------------------  Chemistries   Recent Labs Lab 08/10/16 2225 08/11/16 0414  NA 137 136  K 3.1* 3.5  CL 104 105  CO2 25 22  GLUCOSE 103* 99  BUN 9 7  CREATININE 0.74 0.60  CALCIUM 8.5* 7.9*  MG 1.5*  --   AST 36  --   ALT 23  --   ALKPHOS 125  --   BILITOT 1.0  --    ------------------------------------------------------------------------------------------------------------------  Cardiac Enzymes No results for input(s): TROPONINI in the last 168 hours. ------------------------------------------------------------------------------------------------------------------  RADIOLOGY:  No results found.  EKG:   Orders placed or performed in visit on 07/09/15  . EKG 12-Lead    ASSESSMENT AND PLAN:   Terri Mann is a 40 y.o. female presenting with Abdominal Pain .  Admitted 08/10/2016 : Day #: 1 day 1. Acute pancreatitis: Unclear etiology abdominal ultrasound performed  within normal limits, checked triglycerides- not high, patient denies alcohol usage-continue IV fluids and nothing by mouth status pain medication,  Now improved some- so strarted on liquid diet. 2. Hypokalemia: Replaced 3. GERD without esophagitis PPI therapy 4. Hypothyroidism unspecified Synthroid 5. Hypomagnesemia   Replace IV 6. Gastritis    Pt takes BC tablets daily, possible gastritis or peptic ulcer disease    PPI oral BID. Add sucralfate.   All the records are reviewed and case discussed with Care Management/Social Workerr. Management plans discussed with the patient, family and they are in agreement.  CODE STATUS: full TOTAL TIME TAKING CARE OF THIS PATIENT: 35 minutes.   POSSIBLE D/C IN 2-3DAYS, DEPENDING ON CLINICAL CONDITION.   Altamese DillingVACHHANI, Siobahn Worsley M.D on 08/12/2016 at 5:43 PM  Between 7am to 6pm - Pager - [phone]  After 6pm: House Pager: - [phone]  [facility]  Office  [phone]  CC: Primary care physician; [facility]

## 2016-08-13 LAB — MAGNESIUM: MAGNESIUM: 2.1 mg/dL (ref 1.7–2.4)

## 2016-08-13 MED ORDER — BISACODYL 5 MG PO TBEC: 5.0000 mg | DELAYED_RELEASE_TABLET | Freq: Every day | ORAL | 0 refills | Status: DC | PRN

## 2016-08-13 MED ORDER — OXYCODONE-ACETAMINOPHEN 5-325 MG PO TABS: 1.0000 | ORAL_TABLET | Freq: Four times a day (QID) | ORAL | 0 refills | Status: DC | PRN

## 2016-08-13 MED ORDER — PANTOPRAZOLE SODIUM 40 MG PO TBEC: 40.0000 mg | DELAYED_RELEASE_TABLET | Freq: Two times a day (BID) | ORAL | 1 refills | Status: DC

## 2016-08-13 MED ORDER — SUCRALFATE 1 G PO TABS: 1.0000 g | ORAL_TABLET | Freq: Three times a day (TID) | ORAL | 1 refills | Status: DC

## 2016-08-13 MED ORDER — METHYLNALTREXONE BROMIDE 12 MG/0.6ML ~~LOC~~ SOLN: 12.0000 mg | Freq: Once | SUBCUTANEOUS | Status: DC

## 2016-08-13 NOTE — Discharge Instructions (Signed)
Don't take over the counter pain medicines. Follow with GI clinic to have endoscopy.

## 2016-08-13 NOTE — Progress Notes (Signed)
PAtient being discharge home as per order, discahrge instruction provided, IV removed tele removed

## 2016-08-13 NOTE — Progress Notes (Signed)
Patient presently up ambulating, PNR pain med administer and effective, VSS no complaint at this time.

## 2016-08-18 NOTE — Discharge Summary (Signed)
[facility] - [place] at [facility]   PATIENT NAME: Terri Mann    MR#:  [id number]  DATE OF BIRTH:  29-Oct-1976  DATE OF ADMISSION:  08/10/2016 ADMITTING PHYSICIAN: Tonye Royalty, DO  DATE OF DISCHARGE: 08/13/2016  3:13 PM  PRIMARY CARE PHYSICIAN: [facility]    ADMISSION DIAGNOSIS:  Acute pancreatitis, unspecified pancreatitis type [K85.9]  DISCHARGE DIAGNOSIS:  Active Problems:   Pancreatitis   Acute pancreatitis   SECONDARY DIAGNOSIS:   Past Medical History:  Diagnosis Date  . Asthma   . Chronic back pain   . Thyroid disease     HOSPITAL COURSE:   1. Acute pancreatitis: Unclear etiology abdominal ultrasound performed within normal limits, checked triglycerides- not high, patient denies alcohol usage-continue IV fluids and nothing by mouth status pain medication,  Now improved some- so strarted on liquid diet.   Upgraded diet and discharged later. 2. Hypokalemia: Replaced 3. GERD without esophagitis PPI therapy 4. Hypothyroidism unspecified Synthroid 5. Hypomagnesemia   Replace IV 6. Gastritis    Pt takes BC tablets daily, possible gastritis or peptic ulcer disease    PPI oral BID. Add sucralfate.  DISCHARGE CONDITIONS:   Stable.  CONSULTS OBTAINED:    DRUG ALLERGIES:  No Known Allergies  DISCHARGE MEDICATIONS:   Discharge Medication List as of 08/13/2016  1:32 PM    START taking these medications   Details  bisacodyl (DULCOLAX) 5 MG EC tablet Take 1 tablet (5 mg total) by mouth daily as needed for moderate constipation., Starting Tue 08/13/2016, Normal    oxyCODONE-acetaminophen (PERCOCET/ROXICET) 5-325 MG tablet Take 1 tablet by mouth every 6 (six) hours as needed for moderate pain or severe pain., Starting Tue 08/13/2016, Print    sucralfate (CARAFATE) 1 g tablet Take 1 tablet (1 g total) by mouth 4 (four) times daily -  with meals and at bedtime., Starting Tue 08/13/2016, Print      CONTINUE these  medications which have CHANGED   Details  pantoprazole (PROTONIX) 40 MG tablet Take 1 tablet (40 mg total) by mouth 2 (two) times daily before a meal., Starting Tue 08/13/2016, Print      CONTINUE these medications which have NOT CHANGED   Details  cetirizine (ZYRTEC) 10 MG tablet Take 10 mg by mouth daily., Historical Med    levothyroxine (SYNTHROID, LEVOTHROID) 100 MCG tablet Take 100 mcg by mouth daily before breakfast., Historical Med    ondansetron (ZOFRAN) 4 MG tablet Take 1 tablet (4 mg total) by mouth every 8 (eight) hours as needed for nausea or vomiting., Starting Sat 08/10/2016, Print      STOP taking these medications     Aspirin-Salicylamide-Caffeine (BC HEADACHE POWDER PO)      HYDROmorphone (DILAUDID) 2 MG tablet      ranitidine (ZANTAC) 150 MG tablet          DISCHARGE INSTRUCTIONS:    Follow with PMD in 1-2 weeks.  If you experience worsening of your admission symptoms, develop shortness of breath, life threatening emergency, suicidal or homicidal thoughts you must seek medical attention immediately by calling 911 or calling your MD immediately  if symptoms less severe.  You Must read complete instructions/literature along with all the possible adverse reactions/side effects for all the Medicines you take and that have been prescribed to you. Take any new Medicines after you have completely understood and accept all the possible adverse reactions/side effects.   Please note  You were cared for by a hospitalist during your  hospital stay. If you have any questions about your discharge medications or the care you received while you were in the hospital after you are discharged, you can call the unit and asked to speak with the hospitalist on call if the hospitalist that took care of you is not available. Once you are discharged, your primary care physician will handle any further medical issues. Please note that NO REFILLS for any discharge medications will be  authorized once you are discharged, as it is imperative that you return to your primary care physician (or establish a relationship with a primary care physician if you do not have one) for your aftercare needs so that they can reassess your need for medications and monitor your lab values.    Today   CHIEF COMPLAINT:   Chief Complaint  Patient presents with  . Abdominal Pain    HISTORY OF PRESENT ILLNESS:  Terri Mann  is a 40 y.o. female with a known history of Asthma, chronic back pain and hypothyroidism presents to the emergency department complaining of abdominal pain.  Patient reports a long recent history of abdominal pain most often localized to the right upper quadrant and right flank. She has had several episodes in the past few months but comes in today with worsening severe pain described as burning in the mid epigastrium. She reports associated nausea and generalized fatigue.She reports that she has been taking BC tablets 5-6 times a day for the past 10 or more years. She states that she started taking them for arthritis pains and has become dependent on them. She has not yet seen a gastroenterologist but has plans to in the coming weeks. She has had multiple emergency department visits recently for the same symptoms including one earlier today where she was discharged home. She returns this evening for refractory pain  Otherwise there has been no change in status. Patient has been taking medication as prescribed and there has been no recent change in medication or diet.  There has been no recent illness, travel or sick contacts.    Patient denies fevers/chills, weakness, dizziness, chest pain, shortness of breath, dysuria/frequency, changes in mental status.     VITAL SIGNS:  Blood pressure 119/77, pulse 72, temperature 98.3 F (36.8 C), temperature source Oral, resp. rate 16, height 5' 7.5" (1.715 m), weight 84.5 kg (186 lb 4.8 oz), last menstrual period 08/10/2016, SpO2  97 %.  I/O:  No intake or output data in the 24 hours ending 08/18/16 1519  PHYSICAL EXAMINATION:   GENERAL:40 y.o.female currently in no acute distress.  HEAD: Normocephalic, atraumatic.  EYES: Pupils equal, round, reactive to light. Extraocular muscles intact. No scleral icterus.  MOUTH: Moist mucosal membrane. Dentition intact. No abscess noted.  EAR, NOSE, THROAT: Clear without exudates. No external lesions.  NECK: Supple. No thyromegaly. No nodules. No JVD.  PULMONARY: Clear to ascultation, without wheeze rails or rhonci. No use of accessory muscles, Good respiratory effort. good air entry bilaterally CHEST: Nontender to palpation.  CARDIOVASCULAR: S1 and S2. Regular rate and rhythm. No murmurs, rubs, or gallops. No edema. Pedal pulses 2+ bilaterally.  GASTROINTESTINAL: Soft, minimal tenderness on palpation, nondistended. No masses. Positive bowel sounds. No hepatosplenomegaly.  MUSCULOSKELETAL: No swelling, clubbing, or edema. Range of motion full in all extremities.  NEUROLOGIC: Cranial nerves II through XII are intact. No gross focal neurological deficits. Sensation intact. Reflexes intact.  SKIN: No ulceration, lesions, rashes, or cyanosis. Skin warm and dry. Turgor intact.  PSYCHIATRIC: Mood, affect within normal  limits. The patient is awake, alert and oriented x 3. Insight, judgment intact.    DATA REVIEW:   CBC No results for input(s): WBC, HGB, HCT, PLT in the last 168 hours.  Chemistries   Recent Labs Lab 08/13/16 0447  MG 2.1    Cardiac Enzymes No results for input(s): TROPONINI in the last 168 hours.  Microbiology Results  No results found for this or any previous visit.  RADIOLOGY:  No results found.  EKG:   Orders placed or performed in visit on 07/09/15  . EKG 12-Lead      Management plans discussed with the patient, family and they are in agreement.  CODE STATUS:  Code Status History    Date Active Date Inactive Code Status Order ID  Comments User Context   08/11/2016  1:16 AM 08/13/2016  6:14 PM Full Code [id number]  Tonye Royalty, DO Inpatient      TOTAL TIME TAKING CARE OF THIS PATIENT: 35 minutes.    Altamese Dilling M.D on 08/18/2016 at 3:19 PM  Between 7am to 6pm - Pager - [phone]  After 6pm go to [email or web address] - password [email or web address]  [facility]  Office  [phone]  CC: Primary care physician; [facility]   Note: This dictation was prepared with Dragon dictation along with smaller phrase technology. Any transcriptional errors that result from this process are unintentional.

## 2016-10-29 ENCOUNTER — Encounter: Payer: Self-pay | Admitting: Emergency Medicine

## 2016-10-29 ENCOUNTER — Emergency Department: Payer: Medicaid Other

## 2016-10-29 ENCOUNTER — Emergency Department
Admission: EM | Admit: 2016-10-29 | Discharge: 2016-10-29 | Disposition: A | Discharge status: Home / Self Care | Payer: Medicaid Other | Attending: Emergency Medicine | Admitting: Emergency Medicine

## 2016-10-29 DIAGNOSIS — F17210 Nicotine dependence, cigarettes, uncomplicated: Secondary | ICD-10-CM | POA: Insufficient documentation

## 2016-10-29 DIAGNOSIS — Z79899 Other long term (current) drug therapy: Secondary | ICD-10-CM | POA: Diagnosis not present

## 2016-10-29 DIAGNOSIS — R1011 Right upper quadrant pain: Secondary | ICD-10-CM | POA: Diagnosis not present

## 2016-10-29 DIAGNOSIS — J45909 Unspecified asthma, uncomplicated: Secondary | ICD-10-CM | POA: Diagnosis not present

## 2016-10-29 LAB — URINALYSIS, COMPLETE (UACMP) WITH MICROSCOPIC
BACTERIA UA: NONE SEEN
BILIRUBIN URINE: NEGATIVE
GLUCOSE, UA: NEGATIVE mg/dL
Ketones, ur: NEGATIVE mg/dL
LEUKOCYTES UA: NEGATIVE
NITRITE: NEGATIVE
Protein, ur: 100 mg/dL — AB
SPECIFIC GRAVITY, URINE: 1.015 (ref 1.005–1.030)
pH: 6 (ref 5.0–8.0)

## 2016-10-29 LAB — CBC
HEMATOCRIT: 40.6 % (ref 35.0–47.0)
HEMOGLOBIN: 14.2 g/dL (ref 12.0–16.0)
MCH: 38 pg — ABNORMAL HIGH (ref 26.0–34.0)
MCHC: 34.9 g/dL (ref 32.0–36.0)
MCV: 108.8 fL — ABNORMAL HIGH (ref 80.0–100.0)
Platelets: 202 10*3/uL (ref 150–440)
RBC: 3.73 MIL/uL — ABNORMAL LOW (ref 3.80–5.20)
RDW: 17.1 % — AB (ref 11.5–14.5)
WBC: 8.6 10*3/uL (ref 3.6–11.0)

## 2016-10-29 LAB — COMPREHENSIVE METABOLIC PANEL
ALK PHOS: 103 U/L (ref 38–126)
ALT: 33 U/L (ref 14–54)
AST: 43 U/L — ABNORMAL HIGH (ref 15–41)
Albumin: 4.3 g/dL (ref 3.5–5.0)
Anion gap: 11 (ref 5–15)
BILIRUBIN TOTAL: 0.6 mg/dL (ref 0.3–1.2)
BUN: 8 mg/dL (ref 6–20)
CALCIUM: 9 mg/dL (ref 8.9–10.3)
CO2: 24 mmol/L (ref 22–32)
Chloride: 102 mmol/L (ref 101–111)
Creatinine, Ser: 0.66 mg/dL (ref 0.44–1.00)
GFR calc Af Amer: >60 mL/min (ref >60)
GFR calc non Af Amer: >60 mL/min (ref >60)
Glucose, Bld: 104 mg/dL — ABNORMAL HIGH (ref 65–99)
POTASSIUM: 3.5 mmol/L (ref 3.5–5.1)
Sodium: 137 mmol/L (ref 135–145)
TOTAL PROTEIN: 7.8 g/dL (ref 6.5–8.1)

## 2016-10-29 LAB — LIPASE, BLOOD: Lipase: 127 U/L — ABNORMAL HIGH (ref 11–51)

## 2016-10-29 LAB — POCT PREGNANCY, URINE: PREG TEST UR: NEGATIVE

## 2016-10-29 MED ORDER — OXYCODONE HCL 5 MG PO TABS
ORAL_TABLET | ORAL | Status: AC
  Filled 2016-10-29: qty 1

## 2016-10-29 MED ORDER — OXYCODONE-ACETAMINOPHEN 5-325 MG PO TABS: 1.0000 | ORAL_TABLET | Freq: Four times a day (QID) | ORAL | 0 refills | Status: DC | PRN

## 2016-10-29 MED ORDER — ONDANSETRON 4 MG PO TBDP
8.0000 mg | ORAL_TABLET | Freq: Once | ORAL | Status: AC
  Administered 2016-10-29: 8 mg via ORAL

## 2016-10-29 MED ORDER — OXYCODONE HCL 5 MG PO TABS
5.0000 mg | ORAL_TABLET | Freq: Once | ORAL | Status: AC
  Administered 2016-10-29: 5 mg via ORAL

## 2016-10-29 MED ORDER — ONDANSETRON 4 MG PO TBDP
ORAL_TABLET | ORAL | Status: AC
  Filled 2016-10-29: qty 2

## 2016-10-29 MED ORDER — ONDANSETRON 4 MG PO TBDP: 4.0000 mg | ORAL_TABLET | Freq: Three times a day (TID) | ORAL | 0 refills | Status: DC | PRN

## 2016-10-29 NOTE — ED Notes (Signed)
Discharge instructions reviewed with patient. Questions fielded by this RN. Patient verbalizes understanding of instructions. Patient discharged home in stable condition per Stafford MD . No acute distress noted at time of discharge.  

## 2016-10-29 NOTE — ED Triage Notes (Signed)
Pt to ed with c/o abd distention and pain on right side.  +nausea, +diarrhea.  denies fever,

## 2016-10-29 NOTE — ED Provider Notes (Signed)
[facility] Emergency Department Provider Note  ____________________________________________  Time seen: Approximately 7:16 PM  I have reviewed the triage vital signs and the nursing notes.   HISTORY  Chief Complaint Abdominal Pain    HPI Terri Mann is a 40 y.o. female who complains of right upper quadrant pain for the past 3 days, worse after eating. She is tolerating fluids and not vomiting. No fever or chills. Reports a history of pancreatitis as well as some biliary disease, planned for elective outpatient cholecystectomy but currently due to insurance changes she is awaiting [facility] which should happen within the next month, at which point she will be able to be referred to general surgery for the cholecystectomy. No alleviating factors for the pain. Moderate intensity at worst, currently mild.  Pain is nonradiating Past Medical History:  Diagnosis Date  . Asthma   . Chronic back pain   . Thyroid disease      Patient Active Problem List   Diagnosis Date Noted  . Acute pancreatitis 08/11/2016  . Pancreatitis 08/10/2016     Past Surgical History:  Procedure Laterality Date  . tubial ligation        Prior to Admission medications   Medication Sig Start Date End Date Taking? Authorizing Provider  bisacodyl (DULCOLAX) 5 MG EC tablet Take 1 tablet (5 mg total) by mouth daily as needed for moderate constipation. 08/13/16   Altamese DillingVaibhavkumar Vachhani, MD  cetirizine (ZYRTEC) 10 MG tablet Take 10 mg by mouth daily.    Historical Provider, MD  levothyroxine (SYNTHROID, LEVOTHROID) 100 MCG tablet Take 100 mcg by mouth daily before breakfast.    Historical Provider, MD  ondansetron (ZOFRAN ODT) 4 MG disintegrating tablet Take 1 tablet (4 mg total) by mouth every 8 (eight) hours as needed for nausea or vomiting. 10/29/16   Sharman CheekPhillip Clemma Johnsen, MD  ondansetron (ZOFRAN) 4 MG tablet Take 1 tablet (4 mg total) by mouth  every 8 (eight) hours as needed for nausea or vomiting. 08/10/16   Jennye MoccasinBrian S Quigley, MD  oxyCODONE-acetaminophen (ROXICET) 5-325 MG tablet Take 1 tablet by mouth every 6 (six) hours as needed for severe pain. 10/29/16   Sharman CheekPhillip Dewain Platz, MD  pantoprazole (PROTONIX) 40 MG tablet Take 1 tablet (40 mg total) by mouth 2 (two) times daily before a meal. 08/13/16   Altamese DillingVaibhavkumar Vachhani, MD  sucralfate (CARAFATE) 1 g tablet Take 1 tablet (1 g total) by mouth 4 (four) times daily -  with meals and at bedtime. 08/13/16   Altamese DillingVaibhavkumar Vachhani, MD     Allergies Patient has no known allergies.   History reviewed. No pertinent family history.  Social History Social History  Substance Use Topics  . Smoking status: Current Some Day Smoker    Packs/day: 2.00    Types: Cigarettes  . Smokeless tobacco: Never Used  . Alcohol use No     Comment: occs.     Review of Systems  Constitutional:   No fever or chills.  ENT:   No sore throat. No rhinorrhea. Cardiovascular:   No chest pain. Respiratory:   No dyspnea or cough. Gastrointestinal:   Positive right upper quadrant abdominal pain without vomiting and diarrhea.  Genitourinary:   Negative for dysuria or difficulty urinating. Musculoskeletal:   Negative for focal pain or swelling Neurological:   Negative for headaches 10-point ROS otherwise negative.  ____________________________________________   PHYSICAL EXAM:  VITAL SIGNS: ED Triage Vitals  Enc Vitals Group  BP 10/29/16 1435 (!) 178/91     Pulse Rate 10/29/16 1435 (!) 117     Resp 10/29/16 1435 20     Temp 10/29/16 1435 97.2 F (36.2 C)     Temp Source 10/29/16 1435 Oral     SpO2 10/29/16 1435 100 %     Weight 10/29/16 1436 186 lb (84.4 kg)     Height --      Head Circumference --      Peak Flow --      Pain Score 10/29/16 1436 6     Pain Loc --      Pain Edu? --      Excl. in GC? --     Vital signs reviewed, nursing assessments reviewed.   Constitutional:   Alert and  oriented. Well appearing and in no distress. Eyes:   No scleral icterus. No conjunctival pallor. PERRL. EOMI.  No nystagmus. ENT   Head:   Normocephalic and atraumatic.   Nose:   No congestion/rhinnorhea. No septal hematoma   Mouth/Throat:   Dry mucous membranous, no pharyngeal erythema. No peritonsillar mass.    Neck:   No stridor. No SubQ emphysema. No meningismus. Hematological/Lymphatic/Immunilogical:   No cervical lymphadenopathy. Cardiovascular:   RRR. Symmetric bilateral radial and DP pulses.  No murmurs.  Respiratory:   Normal respiratory effort without tachypnea nor retractions. Breath sounds are clear and equal bilaterally. No wheezes/rales/rhonchi. Gastrointestinal:   Soft With mild right upper quadrant tenderness. Non distended. There is no CVA tenderness.  No rebound, rigidity, or guarding. Negative Murphy sign Genitourinary:   deferred Musculoskeletal:   Nontender with normal range of motion in all extremities. No joint effusions.  No lower extremity tenderness.  No edema. Neurologic:   Normal speech and language.  CN 2-10 normal. Motor grossly intact. No gross focal neurologic deficits are appreciated.  Skin:    Skin is warm, dry and intact. No rash noted.  No petechiae, purpura, or bullae.  ____________________________________________    LABS (pertinent positives/negatives) (all labs ordered are listed, but only abnormal results are displayed) Labs Reviewed  LIPASE, BLOOD - Abnormal; Notable for the following:       Result Value   Lipase 127 (*)    All other components within normal limits  COMPREHENSIVE METABOLIC PANEL - Abnormal; Notable for the following:    Glucose, Bld 104 (*)    AST 43 (*)    All other components within normal limits  CBC - Abnormal; Notable for the following:    RBC 3.73 (*)    MCV 108.8 (*)    MCH 38.0 (*)    RDW 17.1 (*)    All other components within normal limits  URINALYSIS, COMPLETE (UACMP) WITH MICROSCOPIC - Abnormal;  Notable for the following:    Color, Urine YELLOW (*)    APPearance CLEAR (*)    Hgb urine dipstick MODERATE (*)    Protein, ur 100 (*)    Squamous Epithelial / LPF 6-30 (*)    All other components within normal limits  POC URINE PREG, ED  POCT PREGNANCY, URINE   ____________________________________________   EKG    ____________________________________________    RADIOLOGY  Ultrasound right upper quadrant unremarkable  ____________________________________________   PROCEDURES Procedures  ____________________________________________   INITIAL IMPRESSION / ASSESSMENT AND PLAN / ED COURSE  Pertinent labs & imaging results that were available during my care of the patient were reviewed by me and considered in my medical decision making (see chart for details).  Patient well appearing no acute distress, vital signs unremarkable except for tachycardia triage. On my exam the patient is not tachycardic and has a heart rate of about 90. She has had decreased oral intake over the past 3 days and has some evidence clinically of dehydration which may be causing some orthostatic changes in heart rate. Ultrasound unremarkable. Labs unremarkable except for slight elevation of lipase which is likely a chronic issue for her looking at her previous labs and medical history. She does note that this pain does not feel at all like her pancreatitis.. Afebrile.  Considering the patient's symptoms, medical history, and physical examination today, I have low suspicion for cholecystitis or cholangitis, pancreatitis, perforation or bowel obstruction, hernia, intra-abdominal abscess, AAA or dissection, volvulus or intussusception, mesenteric ischemia, or appendicitis.  We'll continue with symptom control with Zofran and Percocet, follow-up with primary care as soon as possible. Return precautions given   Clinical Course    ____________________________________________   FINAL CLINICAL  IMPRESSION(S) / ED DIAGNOSES  Final diagnoses:  Right upper quadrant abdominal pain      New Prescriptions   ONDANSETRON (ZOFRAN ODT) 4 MG DISINTEGRATING TABLET    Take 1 tablet (4 mg total) by mouth every 8 (eight) hours as needed for nausea or vomiting.   OXYCODONE-ACETAMINOPHEN (ROXICET) 5-325 MG TABLET    Take 1 tablet by mouth every 6 (six) hours as needed for severe pain.     Portions of this note were generated with dragon dictation software. Dictation errors may occur despite best attempts at proofreading.    Sharman CheekPhillip Breelle Hollywood, MD 10/29/16 [phone]

## 2016-10-29 NOTE — ED Notes (Signed)
Pt presents with side pain on right side x 3 days. Pt states it hurts some on her left side, but mostly on the right. She reports bloating as well. Pt states she had a similar episode 3 months ago and was told she had diverticulitis, was given antibiotics, and felt better. She never followed up with another physician. Pt also reports head cold x 3 weeks, which leads to some dizziness. Pt alert & oriented. NAD noted.

## 2016-11-12 ENCOUNTER — Emergency Department
Admission: EM | Admit: 2016-11-12 | Discharge: 2016-11-12 | Disposition: A | Discharge status: Home / Self Care | Payer: Medicaid Other | Attending: Emergency Medicine | Admitting: Emergency Medicine

## 2016-11-12 ENCOUNTER — Encounter: Payer: Self-pay | Admitting: Emergency Medicine

## 2016-11-12 DIAGNOSIS — Z79899 Other long term (current) drug therapy: Secondary | ICD-10-CM | POA: Insufficient documentation

## 2016-11-12 DIAGNOSIS — K859 Acute pancreatitis without necrosis or infection, unspecified: Secondary | ICD-10-CM | POA: Diagnosis not present

## 2016-11-12 DIAGNOSIS — R1030 Lower abdominal pain, unspecified: Secondary | ICD-10-CM | POA: Diagnosis present

## 2016-11-12 DIAGNOSIS — J45909 Unspecified asthma, uncomplicated: Secondary | ICD-10-CM | POA: Diagnosis not present

## 2016-11-12 DIAGNOSIS — F17210 Nicotine dependence, cigarettes, uncomplicated: Secondary | ICD-10-CM | POA: Insufficient documentation

## 2016-11-12 LAB — COMPREHENSIVE METABOLIC PANEL
ALBUMIN: 4.5 g/dL (ref 3.5–5.0)
ALK PHOS: 116 U/L (ref 38–126)
ALT: 25 U/L (ref 14–54)
ANION GAP: 9 (ref 5–15)
AST: 42 U/L — ABNORMAL HIGH (ref 15–41)
BUN: 11 mg/dL (ref 6–20)
CALCIUM: 9.3 mg/dL (ref 8.9–10.3)
CO2: 26 mmol/L (ref 22–32)
CREATININE: 0.75 mg/dL (ref 0.44–1.00)
Chloride: 102 mmol/L (ref 101–111)
GFR calc Af Amer: >60 mL/min (ref >60)
GFR calc non Af Amer: >60 mL/min (ref >60)
GLUCOSE: 150 mg/dL — AB (ref 65–99)
Potassium: 3.9 mmol/L (ref 3.5–5.1)
SODIUM: 137 mmol/L (ref 135–145)
TOTAL PROTEIN: 7.9 g/dL (ref 6.5–8.1)
Total Bilirubin: 1.2 mg/dL (ref 0.3–1.2)

## 2016-11-12 LAB — CBC
HCT: 42.4 % (ref 35.0–47.0)
Hemoglobin: 14.9 g/dL (ref 12.0–16.0)
MCH: 38.7 pg — AB (ref 26.0–34.0)
MCHC: 35.3 g/dL (ref 32.0–36.0)
MCV: 109.9 fL — ABNORMAL HIGH (ref 80.0–100.0)
Platelets: 202 10*3/uL (ref 150–440)
RBC: 3.86 MIL/uL (ref 3.80–5.20)
RDW: 18.6 % — AB (ref 11.5–14.5)
WBC: 10.3 10*3/uL (ref 3.6–11.0)

## 2016-11-12 LAB — LIPASE, BLOOD: Lipase: 780 U/L — ABNORMAL HIGH (ref 11–51)

## 2016-11-12 MED ORDER — PANTOPRAZOLE SODIUM 40 MG IV SOLR
40.0000 mg | Freq: Once | INTRAVENOUS | Status: AC
  Administered 2016-11-12: 40 mg via INTRAVENOUS
  Filled 2016-11-12: qty 40

## 2016-11-12 MED ORDER — OXYCODONE-ACETAMINOPHEN 7.5-325 MG PO TABS: 1.0000 | ORAL_TABLET | ORAL | 0 refills | Status: DC | PRN

## 2016-11-12 MED ORDER — ONDANSETRON HCL 4 MG/2ML IJ SOLN
INTRAMUSCULAR | Status: AC
  Filled 2016-11-12: qty 2

## 2016-11-12 MED ORDER — SODIUM CHLORIDE 0.9 % IV BOLUS (SEPSIS)
1000.0000 mL | Freq: Once | INTRAVENOUS | Status: AC
  Administered 2016-11-12: 1000 mL via INTRAVENOUS

## 2016-11-12 MED ORDER — MORPHINE SULFATE (PF) 4 MG/ML IV SOLN
4.0000 mg | Freq: Once | INTRAVENOUS | Status: AC
  Administered 2016-11-12: 4 mg via INTRAVENOUS

## 2016-11-12 MED ORDER — ONDANSETRON 4 MG PO TBDP: 4.0000 mg | ORAL_TABLET | Freq: Three times a day (TID) | ORAL | 0 refills | Status: DC | PRN

## 2016-11-12 MED ORDER — SODIUM CHLORIDE 0.9 % IV SOLN
Freq: Once | INTRAVENOUS | Status: AC
  Administered 2016-11-12: 14:00:00 via INTRAVENOUS

## 2016-11-12 MED ORDER — HYDROMORPHONE HCL 1 MG/ML IJ SOLN
1.0000 mg | Freq: Once | INTRAMUSCULAR | Status: AC
  Administered 2016-11-12: 1 mg via INTRAVENOUS
  Filled 2016-11-12: qty 1

## 2016-11-12 MED ORDER — ONDANSETRON HCL 4 MG/2ML IJ SOLN
4.0000 mg | Freq: Once | INTRAMUSCULAR | Status: AC
  Administered 2016-11-12: 4 mg via INTRAVENOUS

## 2016-11-12 MED ORDER — MORPHINE SULFATE (PF) 2 MG/ML IV SOLN
INTRAVENOUS | Status: AC
  Filled 2016-11-12: qty 2

## 2016-11-12 MED ORDER — PANTOPRAZOLE SODIUM 40 MG PO TBEC: 40.0000 mg | DELAYED_RELEASE_TABLET | Freq: Every day | ORAL | 1 refills | Status: DC

## 2016-11-12 NOTE — ED Triage Notes (Signed)
Pt to ED via POV for upper quad, pt hx of pancreatitis. Pt seen last week for pain, given percocet. Pt tachy at 120. Pt denies fevers, chills. Pt vomiting this am. Pt A&Ox4.

## 2016-11-12 NOTE — ED Notes (Signed)
Pt made aware of need for urine sample. Give specimen cup.

## 2016-11-12 NOTE — ED Notes (Addendum)
Warm blanket given, pts IVF finished and disconnected

## 2016-11-12 NOTE — ED Provider Notes (Signed)
[facility] Emergency Department Provider Note        Time seen: ----------------------------------------- 1:34 PM on 11/12/2016 -----------------------------------------    I have reviewed the triage vital signs and the nursing notes.   HISTORY  Chief Complaint Abdominal Pain    HPI Terri Mann is a 41 y.o. female who presents to the ER being brought by private vehicle for upper abdominal pain with a history of pancreatitis. She was seen last week for pain, given Percocet. She presents tachycardic but denies fevers or chills. She has been vomiting this morning. Nothing makes her pain better or worse.   Past Medical History:  Diagnosis Date  . Asthma   . Chronic back pain   . Thyroid disease     Patient Active Problem List   Diagnosis Date Noted  . Acute pancreatitis 08/11/2016  . Pancreatitis 08/10/2016    Past Surgical History:  Procedure Laterality Date  . tubial ligation       Allergies Patient has no known allergies.  Social History Social History  Substance Use Topics  . Smoking status: Current Some Day Smoker    Packs/day: 2.00    Types: Cigarettes  . Smokeless tobacco: Never Used  . Alcohol use Yes     Comment: occs.     Review of Systems Constitutional: Negative for fever. Cardiovascular: Negative for chest pain. Respiratory: Negative for shortness of breath. Gastrointestinal: Positive for abdominal pain, vomiting Genitourinary: Negative for dysuria. Musculoskeletal: Positive for back pain Skin: Negative for rash. Neurological: Negative for headaches, focal weakness or numbness.  10-point ROS otherwise negative.  ____________________________________________   PHYSICAL EXAM:  VITAL SIGNS: ED Triage Vitals  Enc Vitals Group     BP 11/12/16 1139 (!) 158/95     Pulse Rate 11/12/16 1139 (!) 112     Resp 11/12/16 1139 16     Temp 11/12/16 1139 97.5 F (36.4 C)     Temp Source 11/12/16 1139 Oral     SpO2  11/12/16 1139 100 %     Weight 11/12/16 1140 175 lb (79.4 kg)     Height 11/12/16 1140 5\' 7"  (1.702 m)     Head Circumference --      Peak Flow --      Pain Score 11/12/16 1140 10     Pain Loc --      Pain Edu? --      Excl. in GC? --     Constitutional: Alert and oriented. Well appearing and in no distress. Eyes: Conjunctivae are normal. PERRL. Normal extraocular movements. ENT   Head: Normocephalic and atraumatic.   Nose: No congestion/rhinnorhea.   Mouth/Throat: Mucous membranes are moist.   Neck: No stridor. Cardiovascular: Normal rate, regular rhythm. No murmurs, rubs, or gallops. Respiratory: Normal respiratory effort without tachypnea nor retractions. Breath sounds are clear and equal bilaterally. No wheezes/rales/rhonchi. Gastrointestinal: Epigastric tenderness, no rebound or guarding. Normal bowel sounds. Musculoskeletal: Nontender with normal range of motion in all extremities. No lower extremity tenderness nor edema. Neurologic:  Normal speech and language. No gross focal neurologic deficits are appreciated.  Skin:  Skin is warm, dry and intact. No rash noted. Psychiatric: Mood and affect are normal. Speech and behavior are normal.  ____________________________________________  ED COURSE:  Pertinent labs & imaging results that were available during my care of the patient were reviewed by me and considered in my medical decision making (see chart for details). Clinical Course   Patient presents to the ER in no distress  but with likely pancreatitis. We will assess with labs and imaging.  Procedures ____________________________________________   LABS (pertinent positives/negatives)  Labs Reviewed  LIPASE, BLOOD - Abnormal; Notable for the following:       Result Value   Lipase 780 (*)    All other components within normal limits  COMPREHENSIVE METABOLIC PANEL - Abnormal; Notable for the following:    Glucose, Bld 150 (*)    AST 42 (*)    All other  components within normal limits  CBC - Abnormal; Notable for the following:    MCV 109.9 (*)    MCH 38.7 (*)    RDW 18.6 (*)    All other components within normal limits  URINALYSIS, COMPLETE (UACMP) WITH MICROSCOPIC   ____________________________________________  FINAL ASSESSMENT AND PLAN  Pancreatitis  Plan: Patient with labs as dictated above. Clinically she has pancreatitis, this is likely alcohol-related. I think it would be unlikely that this would be gallstone related. She has declined hospitalization, we have started her back on and asses, antiemetics and pain medicine. She'll be referred to GI for outpatient follow-up.   Emily FilbertWilliams, Jonathan E, MD   Note: This dictation was prepared with Dragon dictation. Any transcriptional errors that result from this process are unintentional    Emily FilbertJonathan E Williams, MD 11/12/16 1336

## 2016-11-12 NOTE — ED Notes (Addendum)
Pt states hx of pancreatitis, admitted few months ago for it. States also thinks gallbladder problems. States upper abdominal pain all the way across. Pt states insurance is switching over at the time so out of medicines x 1 week. Asking for pain medicine. Pain began last night, some nausea, denies vomiting, diarrhea, fevers.

## 2016-12-30 ENCOUNTER — Encounter: Payer: Self-pay | Admitting: *Deleted

## 2016-12-30 ENCOUNTER — Emergency Department
Admission: EM | Admit: 2016-12-30 | Discharge: 2016-12-30 | Disposition: A | Discharge status: Home / Self Care | Payer: Medicaid Other | Attending: Student in an Organized Health Care Education/Training Program | Admitting: Student in an Organized Health Care Education/Training Program

## 2016-12-30 DIAGNOSIS — R1013 Epigastric pain: Secondary | ICD-10-CM | POA: Diagnosis present

## 2016-12-30 DIAGNOSIS — J45909 Unspecified asthma, uncomplicated: Secondary | ICD-10-CM | POA: Insufficient documentation

## 2016-12-30 DIAGNOSIS — K861 Other chronic pancreatitis: Secondary | ICD-10-CM | POA: Diagnosis not present

## 2016-12-30 DIAGNOSIS — F17210 Nicotine dependence, cigarettes, uncomplicated: Secondary | ICD-10-CM | POA: Diagnosis not present

## 2016-12-30 LAB — URINALYSIS, COMPLETE (UACMP) WITH MICROSCOPIC
BACTERIA UA: NONE SEEN
Bilirubin Urine: NEGATIVE
Glucose, UA: NEGATIVE mg/dL
HGB URINE DIPSTICK: NEGATIVE
KETONES UR: 5 mg/dL — AB
NITRITE: NEGATIVE
PROTEIN: 30 mg/dL — AB
Specific Gravity, Urine: 1.019 (ref 1.005–1.030)
pH: 5 (ref 5.0–8.0)

## 2016-12-30 LAB — COMPREHENSIVE METABOLIC PANEL
ALT: 25 U/L (ref 14–54)
ANION GAP: 9 (ref 5–15)
AST: 35 U/L (ref 15–41)
Albumin: 4.4 g/dL (ref 3.5–5.0)
Alkaline Phosphatase: 108 U/L (ref 38–126)
BUN: 12 mg/dL (ref 6–20)
CO2: 27 mmol/L (ref 22–32)
Calcium: 9.2 mg/dL (ref 8.9–10.3)
Chloride: 100 mmol/L — ABNORMAL LOW (ref 101–111)
Creatinine, Ser: 0.88 mg/dL (ref 0.44–1.00)
GFR calc Af Amer: >60 mL/min (ref >60)
GFR calc non Af Amer: >60 mL/min (ref >60)
GLUCOSE: 100 mg/dL — AB (ref 65–99)
POTASSIUM: 4.1 mmol/L (ref 3.5–5.1)
SODIUM: 136 mmol/L (ref 135–145)
TOTAL PROTEIN: 7.7 g/dL (ref 6.5–8.1)
Total Bilirubin: 0.8 mg/dL (ref 0.3–1.2)

## 2016-12-30 LAB — CBC
HEMATOCRIT: 44.1 % (ref 35.0–47.0)
HEMOGLOBIN: 15 g/dL (ref 12.0–16.0)
MCH: 37.1 pg — AB (ref 26.0–34.0)
MCHC: 34 g/dL (ref 32.0–36.0)
MCV: 109.2 fL — ABNORMAL HIGH (ref 80.0–100.0)
Platelets: 181 10*3/uL (ref 150–440)
RBC: 4.04 MIL/uL (ref 3.80–5.20)
RDW: 15.1 % — ABNORMAL HIGH (ref 11.5–14.5)
WBC: 10.7 10*3/uL (ref 3.6–11.0)

## 2016-12-30 LAB — LIPASE, BLOOD: LIPASE: 197 U/L — AB (ref 11–51)

## 2016-12-30 MED ORDER — OXYCODONE-ACETAMINOPHEN 5-325 MG PO TABS
2.0000 | ORAL_TABLET | Freq: Once | ORAL | Status: AC
  Administered 2016-12-30: 2 via ORAL
  Filled 2016-12-30: qty 2

## 2016-12-30 MED ORDER — PANTOPRAZOLE SODIUM 40 MG PO TBEC
40.0000 mg | DELAYED_RELEASE_TABLET | Freq: Once | ORAL | Status: AC
  Administered 2016-12-30: 40 mg via ORAL
  Filled 2016-12-30: qty 1

## 2016-12-30 MED ORDER — PANTOPRAZOLE SODIUM 40 MG PO TBEC: 40.0000 mg | DELAYED_RELEASE_TABLET | Freq: Every day | ORAL | 1 refills | Status: AC

## 2016-12-30 MED ORDER — PROMETHAZINE HCL 12.5 MG PO TABS: 12.5000 mg | ORAL_TABLET | Freq: Four times a day (QID) | ORAL | 0 refills | Status: DC | PRN

## 2016-12-30 MED ORDER — HYDROCODONE-ACETAMINOPHEN 5-325 MG PO TABS: 2.0000 | ORAL_TABLET | Freq: Four times a day (QID) | ORAL | 0 refills | Status: DC | PRN

## 2016-12-30 MED ORDER — SODIUM CHLORIDE 0.9 % IV BOLUS (SEPSIS)
1000.0000 mL | Freq: Once | INTRAVENOUS | Status: AC
  Administered 2016-12-30: 1000 mL via INTRAVENOUS

## 2016-12-30 NOTE — Discharge Instructions (Signed)

## 2016-12-30 NOTE — ED Triage Notes (Signed)
States abd pain with nausea for 2 days, states hx of pancreatitis in the past

## 2016-12-30 NOTE — ED Provider Notes (Signed)
[facility] Emergency Department Provider Note    First MD Initiated Contact with Patient 12/30/16 1205     (approximate)  I have reviewed the triage vital signs and the nursing notes.   HISTORY  Chief Complaint Abdominal Pain    HPI Terri PenmanJamie L Mann is a 41 y.o. female with a history of chronic pancreatitis presents with 2 days of worsening nausea and epigastric pain that occurred after she had 2 glasses of wine as well as pizza and hot peppers. States it feels similar to previous episodes of pancreatitis but not as severe. She's been able to tolerate oral hydration. States his symptoms are worse because she's not been on her Protonix that she ran out of his prescription. States that she's been eating only clear liquids without any forceful vomiting. There's been no bloody diarrhea. No melena. Denies any fevers. No chest pain or shortness of breath.   Past Medical History:  Diagnosis Date  . Asthma   . Chronic back pain   . Thyroid disease    History reviewed. No pertinent family history. Past Surgical History:  Procedure Laterality Date  . tubial ligation      Patient Active Problem List   Diagnosis Date Noted  . Acute pancreatitis 08/11/2016  . Pancreatitis 08/10/2016      Prior to Admission medications   Medication Sig Start Date End Date Taking? Authorizing Provider  bisacodyl (DULCOLAX) 5 MG EC tablet Take 1 tablet (5 mg total) by mouth daily as needed for moderate constipation. 08/13/16   Altamese DillingVaibhavkumar Vachhani, MD  cetirizine (ZYRTEC) 10 MG tablet Take 10 mg by mouth daily.    Historical Provider, MD  levothyroxine (SYNTHROID, LEVOTHROID) 100 MCG tablet Take 100 mcg by mouth daily before breakfast.    Historical Provider, MD  ondansetron (ZOFRAN ODT) 4 MG disintegrating tablet Take 1 tablet (4 mg total) by mouth every 8 (eight) hours as needed for nausea or vomiting. 10/29/16   Sharman CheekPhillip Stafford, MD  ondansetron (ZOFRAN ODT) 4 MG  disintegrating tablet Take 1 tablet (4 mg total) by mouth every 8 (eight) hours as needed for nausea or vomiting. 11/12/16   Emily FilbertJonathan E Williams, MD  ondansetron (ZOFRAN) 4 MG tablet Take 1 tablet (4 mg total) by mouth every 8 (eight) hours as needed for nausea or vomiting. 08/10/16   Jennye MoccasinBrian S Quigley, MD  oxyCODONE-acetaminophen (PERCOCET) 7.5-325 MG tablet Take 1 tablet by mouth every 4 (four) hours as needed for severe pain. 11/12/16 11/12/17  Emily FilbertJonathan E Williams, MD  oxyCODONE-acetaminophen (ROXICET) 5-325 MG tablet Take 1 tablet by mouth every 6 (six) hours as needed for severe pain. 10/29/16   Sharman CheekPhillip Stafford, MD  pantoprazole (PROTONIX) 40 MG tablet Take 1 tablet (40 mg total) by mouth 2 (two) times daily before a meal. 08/13/16   Altamese DillingVaibhavkumar Vachhani, MD  pantoprazole (PROTONIX) 40 MG tablet Take 1 tablet (40 mg total) by mouth daily. 11/12/16 11/12/17  Emily FilbertJonathan E Williams, MD  sucralfate (CARAFATE) 1 g tablet Take 1 tablet (1 g total) by mouth 4 (four) times daily -  with meals and at bedtime. 08/13/16   Altamese DillingVaibhavkumar Vachhani, MD    Allergies Patient has no known allergies.    Social History Social History  Substance Use Topics  . Smoking status: Current Some Day Smoker    Packs/day: 2.00    Types: Cigarettes  . Smokeless tobacco: Never Used  . Alcohol use Yes     Comment: occs.     Review of Systems  Patient denies headaches, rhinorrhea, blurry vision, numbness, shortness of breath, chest pain, edema, cough, abdominal pain, nausea, vomiting, diarrhea, dysuria, fevers, rashes or hallucinations unless otherwise stated above in HPI. ____________________________________________   PHYSICAL EXAM:  VITAL SIGNS: Vitals:   12/30/16 1126  BP: (!) 155/91  Pulse: 92  Resp: 18  Temp: 98.2 F (36.8 C)    Constitutional: Alert and oriented. Well appearing and in no acute distress. Eyes: Conjunctivae are normal. PERRL. EOMI. Head: Atraumatic. Nose: No congestion/rhinnorhea. Mouth/Throat:  Mucous membranes are moist.  Oropharynx non-erythematous. Neck: No stridor. Painless ROM. No cervical spine tenderness to palpation Hematological/Lymphatic/Immunilogical: No cervical lymphadenopathy. Cardiovascular: Normal rate, regular rhythm. Grossly normal heart sounds.  Good peripheral circulation. Respiratory: Normal respiratory effort.  No retractions. Lungs CTAB. Gastrointestinal: Soft with mild epigastric ttp.. No distention. No abdominal bruits. No CVA tenderness. Genitourinary:  Musculoskeletal: No lower extremity tenderness nor edema.  No joint effusions. Neurologic:  Normal speech and language. No gross focal neurologic deficits are appreciated. No gait instability. Skin:  Skin is warm, dry and intact. No rash noted. Psychiatric: Mood and affect are normal. Speech and behavior are normal.  ____________________________________________   LABS (all labs ordered are listed, but only abnormal results are displayed)  Results for orders placed or performed during the hospital encounter of 12/30/16 (from the past 24 hour(s))  Lipase, blood     Status: Abnormal   Collection Time: 12/30/16 11:23 AM  Result Value Ref Range   Lipase 197 (H) 11 - 51 U/L  Comprehensive metabolic panel     Status: Abnormal   Collection Time: 12/30/16 11:23 AM  Result Value Ref Range   Sodium 136 135 - 145 mmol/L   Potassium 4.1 3.5 - 5.1 mmol/L   Chloride 100 (L) 101 - 111 mmol/L   CO2 27 22 - 32 mmol/L   Glucose, Bld 100 (H) 65 - 99 mg/dL   BUN 12 6 - 20 mg/dL   Creatinine, Ser 4.09 0.44 - 1.00 mg/dL   Calcium 9.2 8.9 - 81.1 mg/dL   Total Protein 7.7 6.5 - 8.1 g/dL   Albumin 4.4 3.5 - 5.0 g/dL   AST 35 15 - 41 U/L   ALT 25 14 - 54 U/L   Alkaline Phosphatase 108 38 - 126 U/L   Total Bilirubin 0.8 0.3 - 1.2 mg/dL   GFR calc non Af Amer >60 >60 mL/min   GFR calc Af Amer >60 >60 mL/min   Anion gap 9 5 - 15  CBC     Status: Abnormal   Collection Time: 12/30/16 11:23 AM  Result Value Ref Range    WBC 10.7 3.6 - 11.0 K/uL   RBC 4.04 3.80 - 5.20 MIL/uL   Hemoglobin 15.0 12.0 - 16.0 g/dL   HCT 91.4 78.2 - 95.6 %   MCV 109.2 (H) 80.0 - 100.0 fL   MCH 37.1 (H) 26.0 - 34.0 pg   MCHC 34.0 32.0 - 36.0 g/dL   RDW 21.3 (H) 08.6 - 57.8 %   Platelets 181 150 - 440 K/uL   ____________________________________________  EKG____________________________________________  RADIOLOGY   ____________________________________________   PROCEDURES  Procedure(s) performed:  Procedures    Critical Care performed: no ____________________________________________   INITIAL IMPRESSION / ASSESSMENT AND PLAN / ED COURSE  Pertinent labs & imaging results that were available during my care of the patient were reviewed by me and considered in my medical decision making (see chart for details).  DDX: pancreatitis, cholelithiasis, cholecystitis, gastrtitis, pud  Lasandra L  Graeff is a 41 y.o. who presents to the ED with A history of gastritis as well as chronic and recurrent pancreatitis presents with similar symptoms.Patient is AFVSS in ED. Exam as above. Given current presentation have considered the above differential. Patient with well documented history of chronic pancreatitis. Her abdominal exam is tender but not surgical in nature. Do not feel repeat CT imaging clinically indicated as her lipase is only elevated 170 and she has no evidence of fever, hemodynamic instability or metabolic derangement. Patient provided IV fluids. Symptom management obtained with oral medications. Do feel patient is stable for further outpatient management.  Patient was able to tolerate PO and was able to ambulate with a steady gait.  Have discussed with the patient and available family all diagnostics and treatments performed thus far and all questions were answered to the best of my ability. The patient demonstrates understanding and agreement with plan.       ____________________________________________   FINAL  CLINICAL IMPRESSION(S) / ED DIAGNOSES  Final diagnoses:  Abdominal pain, acute, epigastric  Chronic pancreatitis, unspecified pancreatitis type (HCC)      NEW MEDICATIONS STARTED DURING THIS VISIT:  New Prescriptions   No medications on file     Note:  This document was prepared using Dragon voice recognition software and may include unintentional dictation errors.    Willy Eddy, MD 12/30/16 1346

## 2017-01-01 ENCOUNTER — Encounter: Payer: Self-pay | Admitting: Emergency Medicine

## 2017-01-01 ENCOUNTER — Emergency Department
Admission: EM | Admit: 2017-01-01 | Discharge: 2017-01-01 | Disposition: A | Discharge status: Home / Self Care | Payer: Medicaid Other | Attending: Emergency Medicine | Admitting: Emergency Medicine

## 2017-01-01 DIAGNOSIS — K861 Other chronic pancreatitis: Secondary | ICD-10-CM

## 2017-01-01 DIAGNOSIS — F17210 Nicotine dependence, cigarettes, uncomplicated: Secondary | ICD-10-CM | POA: Diagnosis not present

## 2017-01-01 DIAGNOSIS — R109 Unspecified abdominal pain: Secondary | ICD-10-CM | POA: Diagnosis present

## 2017-01-01 DIAGNOSIS — J45909 Unspecified asthma, uncomplicated: Secondary | ICD-10-CM | POA: Insufficient documentation

## 2017-01-01 DIAGNOSIS — Z79899 Other long term (current) drug therapy: Secondary | ICD-10-CM | POA: Diagnosis not present

## 2017-01-01 LAB — CBC WITH DIFFERENTIAL/PLATELET
BASOS ABS: 0 10*3/uL (ref 0–0.1)
Basophils Relative: 0 %
EOS PCT: 1 %
Eosinophils Absolute: 0.1 10*3/uL (ref 0–0.7)
HCT: 40.8 % (ref 35.0–47.0)
Hemoglobin: 14.4 g/dL (ref 12.0–16.0)
LYMPHS PCT: 17 %
Lymphs Abs: 1.3 10*3/uL (ref 1.0–3.6)
MCH: 38.2 pg — ABNORMAL HIGH (ref 26.0–34.0)
MCHC: 35.2 g/dL (ref 32.0–36.0)
MCV: 108.4 fL — AB (ref 80.0–100.0)
Monocytes Absolute: 0.4 10*3/uL (ref 0.2–0.9)
Monocytes Relative: 5 %
Neutro Abs: 5.6 10*3/uL (ref 1.4–6.5)
Neutrophils Relative %: 77 %
PLATELETS: 155 10*3/uL (ref 150–440)
RBC: 3.77 MIL/uL — AB (ref 3.80–5.20)
RDW: 15.1 % — ABNORMAL HIGH (ref 11.5–14.5)
WBC: 7.4 10*3/uL (ref 3.6–11.0)

## 2017-01-01 LAB — LIPASE, BLOOD: Lipase: 94 U/L — ABNORMAL HIGH (ref 11–51)

## 2017-01-01 LAB — COMPREHENSIVE METABOLIC PANEL
ALT: 20 U/L (ref 14–54)
AST: 32 U/L (ref 15–41)
Albumin: 4.4 g/dL (ref 3.5–5.0)
Alkaline Phosphatase: 105 U/L (ref 38–126)
Anion gap: 8 (ref 5–15)
BUN: 12 mg/dL (ref 6–20)
CHLORIDE: 103 mmol/L (ref 101–111)
CO2: 27 mmol/L (ref 22–32)
CREATININE: 0.7 mg/dL (ref 0.44–1.00)
Calcium: 9.2 mg/dL (ref 8.9–10.3)
GFR calc Af Amer: >60 mL/min (ref >60)
GFR calc non Af Amer: >60 mL/min (ref >60)
GLUCOSE: 96 mg/dL (ref 65–99)
Potassium: 3.8 mmol/L (ref 3.5–5.1)
SODIUM: 138 mmol/L (ref 135–145)
Total Bilirubin: 0.8 mg/dL (ref 0.3–1.2)
Total Protein: 7.5 g/dL (ref 6.5–8.1)

## 2017-01-01 LAB — POCT PREGNANCY, URINE: Preg Test, Ur: NEGATIVE

## 2017-01-01 MED ORDER — HYDROCODONE-ACETAMINOPHEN 5-325 MG PO TABS: 1.0000 | ORAL_TABLET | ORAL | 0 refills | Status: DC | PRN

## 2017-01-01 MED ORDER — SODIUM CHLORIDE 0.9 % IV BOLUS (SEPSIS)
1000.0000 mL | Freq: Once | INTRAVENOUS | Status: AC
  Administered 2017-01-01: 1000 mL via INTRAVENOUS

## 2017-01-01 MED ORDER — MORPHINE SULFATE (PF) 4 MG/ML IV SOLN
4.0000 mg | Freq: Once | INTRAVENOUS | Status: AC
  Administered 2017-01-01: 4 mg via INTRAVENOUS
  Filled 2017-01-01: qty 1

## 2017-01-01 MED ORDER — HYDROCODONE-ACETAMINOPHEN 5-325 MG PO TABS: 2.0000 | ORAL_TABLET | Freq: Four times a day (QID) | ORAL | 0 refills | Status: DC | PRN

## 2017-01-01 MED ORDER — DICYCLOMINE HCL 20 MG PO TABS: 20.0000 mg | ORAL_TABLET | Freq: Three times a day (TID) | ORAL | 0 refills | Status: DC | PRN

## 2017-01-01 MED ORDER — ONDANSETRON 4 MG PO TBDP: 4.0000 mg | ORAL_TABLET | Freq: Once | ORAL | Status: DC

## 2017-01-01 MED ORDER — ONDANSETRON HCL 4 MG/2ML IJ SOLN
4.0000 mg | Freq: Once | INTRAMUSCULAR | Status: AC
  Administered 2017-01-01: 4 mg via INTRAVENOUS
  Filled 2017-01-01: qty 2

## 2017-01-01 NOTE — ED Notes (Signed)
Pt states she was seen in ED this week and diagnosed with pancreatis, states no pain relief, awake and alert in no acute distress

## 2017-01-01 NOTE — ED Provider Notes (Signed)
[facility] Emergency Department Provider Note   ____________________________________________   First MD Initiated Contact with Patient 01/01/17 1411     (approximate)  I have reviewed the triage vital signs and the nursing notes.   HISTORY  Chief Complaint Abdominal Pain   HPI Terri Mann is a 41 y.o. female with a history of chronic pancreatic head as was present in the emergency department with a 10 out of 10 up her abdominal pain today which says feels like her pancreatitis. Says that she had several doses of nonbloody vomiting this morning. Denies any diarrhea. Does not report radiation of the pain. Says that she has been using Norco at home without any relief. Says that she had some wine also leading up to her initial visit on the 19th. Says that she has been taking her Protonix.   Past Medical History:  Diagnosis Date  . Asthma   . Chronic back pain   . Thyroid disease     Patient Active Problem List   Diagnosis Date Noted  . Acute pancreatitis 08/11/2016  . Pancreatitis 08/10/2016    Past Surgical History:  Procedure Laterality Date  . tubial ligation       Prior to Admission medications   Medication Sig Start Date End Date Taking? Authorizing Provider  bisacodyl (DULCOLAX) 5 MG EC tablet Take 1 tablet (5 mg total) by mouth daily as needed for moderate constipation. 08/13/16   Altamese Dilling, MD  cetirizine (ZYRTEC) 10 MG tablet Take 10 mg by mouth daily.    Historical Provider, MD  HYDROcodone-acetaminophen (NORCO) 5-325 MG tablet Take 2 tablets by mouth every 6 (six) hours as needed for moderate pain. 12/30/16   Willy Eddy, MD  levothyroxine (SYNTHROID, LEVOTHROID) 100 MCG tablet Take 100 mcg by mouth daily before breakfast.    Historical Provider, MD  ondansetron (ZOFRAN ODT) 4 MG disintegrating tablet Take 1 tablet (4 mg total) by mouth every 8 (eight) hours as needed for nausea or vomiting. 10/29/16   Sharman Cheek, MD  ondansetron (ZOFRAN ODT) 4 MG disintegrating tablet Take 1 tablet (4 mg total) by mouth every 8 (eight) hours as needed for nausea or vomiting. 11/12/16   Emily Filbert, MD  ondansetron (ZOFRAN) 4 MG tablet Take 1 tablet (4 mg total) by mouth every 8 (eight) hours as needed for nausea or vomiting. 08/10/16   Jennye Moccasin, MD  oxyCODONE-acetaminophen (PERCOCET) 7.5-325 MG tablet Take 1 tablet by mouth every 4 (four) hours as needed for severe pain. 11/12/16 11/12/17  Emily Filbert, MD  oxyCODONE-acetaminophen (ROXICET) 5-325 MG tablet Take 1 tablet by mouth every 6 (six) hours as needed for severe pain. 10/29/16   Sharman Cheek, MD  pantoprazole (PROTONIX) 40 MG tablet Take 1 tablet (40 mg total) by mouth 2 (two) times daily before a meal. 08/13/16   Altamese Dilling, MD  pantoprazole (PROTONIX) 40 MG tablet Take 1 tablet (40 mg total) by mouth daily. 12/30/16 12/30/17  Willy Eddy, MD  promethazine (PHENERGAN) 12.5 MG tablet Take 1 tablet (12.5 mg total) by mouth every 6 (six) hours as needed for nausea or vomiting. 12/30/16   Willy Eddy, MD  sucralfate (CARAFATE) 1 g tablet Take 1 tablet (1 g total) by mouth 4 (four) times daily -  with meals and at bedtime. 08/13/16   Altamese Dilling, MD    Allergies Patient has no known allergies.  No family history on file.  Social History Social History  Substance Use Topics  .  Smoking status: Current Some Day Smoker    Packs/day: 2.00    Types: Cigarettes  . Smokeless tobacco: Never Used  . Alcohol use Yes     Comment: occs.     Review of Systems Constitutional: No fever/chills Eyes: No visual changes. ENT: No sore throat. Cardiovascular: Denies chest pain. Respiratory: Denies shortness of breath. Gastrointestinal:No diarrhea.  No constipation. Genitourinary: Negative for dysuria. Musculoskeletal: Negative for back pain. Skin: Negative for rash. Neurological: Negative for headaches, focal weakness  or numbness.  10-point ROS otherwise negative.  ____________________________________________   PHYSICAL EXAM:  VITAL SIGNS: ED Triage Vitals  Enc Vitals Group     BP --      Pulse Rate 01/01/17 1220 86     Resp 01/01/17 1220 20     Temp 01/01/17 1220 98.3 F (36.8 C)     Temp Source 01/01/17 1220 Oral     SpO2 01/01/17 1220 98 %     Weight 01/01/17 1220 173 lb (78.5 kg)     Height --      Head Circumference --      Peak Flow --      Pain Score 01/01/17 1221 10     Pain Loc --      Pain Edu? --      Excl. in GC? --     Constitutional: Alert and oriented. Well appearing and in no acute distress. Eyes: Conjunctivae are normal. PERRL. EOMI. Head: Atraumatic. Nose: No congestion/rhinnorhea. Mouth/Throat: Mucous membranes are moist.   Neck: No stridor.   Cardiovascular: Normal rate, regular rhythm. Grossly normal heart sounds.   Respiratory: Normal respiratory effort.  No retractions. Lungs CTAB. Gastrointestinal: Soft With mild to moderate tenderness across the epigastrium with a negative Murphy sign. No distention.  Musculoskeletal: No lower extremity tenderness nor edema.  No joint effusions. Neurologic:  Normal speech and language. No gross focal neurologic deficits are appreciated.  Skin:  Skin is warm, dry and intact. No rash noted. Psychiatric: Mood and affect are normal. Speech and behavior are normal.  ____________________________________________   LABS (all labs ordered are listed, but only abnormal results are displayed)  Labs Reviewed  CBC WITH DIFFERENTIAL/PLATELET - Abnormal; Notable for the following:       Result Value   RBC 3.77 (*)    MCV 108.4 (*)    MCH 38.2 (*)    RDW 15.1 (*)    All other components within normal limits  LIPASE, BLOOD - Abnormal; Notable for the following:    Lipase 94 (*)    All other components within normal limits  COMPREHENSIVE METABOLIC PANEL  POC URINE PREG, ED  POCT PREGNANCY, URINE    ____________________________________________  EKG   ____________________________________________  RADIOLOGY   ____________________________________________   PROCEDURES  Procedure(s) performed:   Procedures  Critical Care performed:   ____________________________________________   INITIAL IMPRESSION / ASSESSMENT AND PLAN / ED COURSE  Pertinent labs & imaging results that were available during my care of the patient were reviewed by me and considered in my medical decision making (see chart for details).  ----------------------------------------- 5:10 PM on 01/01/2017 -----------------------------------------  Patient pain is improved with fluids as well as morphine. No vomiting here in the emergency department. However, the patient says that her pain appears to be coming back. We discussed a plan the patient would like to try Bentyl at home and several more doses of Norco. She says that she is always out of the Norco. I said that I will be  able give her several more pills and cannot give her full 12 that she was given before. The patient says that she would rather be discharged home with and admitted to the hospital. We discussed if she were to return again that she would need likely, to be admitted to the hospital because of multiple visits for peritonitis. However, the patient's lipase appears to be decreasing at this time and the character of the pain, per the patient, is similar to previous episodes of exercise. She is understanding the plan and willing to comply. Will return for any worsening or concerning symptoms. No vomiting in the emergency department throughout her visit.      ____________________________________________   FINAL CLINICAL IMPRESSION(S) / ED DIAGNOSES  Chronic pancreatitis.    NEW MEDICATIONS STARTED DURING THIS VISIT:  New Prescriptions   No medications on file     Note:  This document was prepared using Dragon voice recognition  software and may include unintentional dictation errors.    Myrna Blazeravid Matthew Aayat Hajjar, MD 01/01/17 [phone]

## 2017-01-01 NOTE — ED Triage Notes (Signed)
Pt states was here two days ago with pancreatitis and the pain meds given to go home with are not helping. Pt needs different pain meds.

## 2017-03-12 ENCOUNTER — Encounter: Payer: Self-pay | Admitting: Emergency Medicine

## 2017-03-12 ENCOUNTER — Emergency Department: Payer: Medicaid Other

## 2017-03-12 ENCOUNTER — Emergency Department
Admission: EM | Admit: 2017-03-12 | Discharge: 2017-03-12 | Disposition: A | Discharge status: Home / Self Care | Payer: Medicaid Other | Attending: Emergency Medicine | Admitting: Emergency Medicine

## 2017-03-12 DIAGNOSIS — Y939 Activity, unspecified: Secondary | ICD-10-CM | POA: Diagnosis not present

## 2017-03-12 DIAGNOSIS — W3400XA Accidental discharge from unspecified firearms or gun, initial encounter: Secondary | ICD-10-CM | POA: Diagnosis not present

## 2017-03-12 DIAGNOSIS — S41032A Puncture wound without foreign body of left shoulder, initial encounter: Secondary | ICD-10-CM | POA: Insufficient documentation

## 2017-03-12 DIAGNOSIS — Y999 Unspecified external cause status: Secondary | ICD-10-CM | POA: Insufficient documentation

## 2017-03-12 DIAGNOSIS — Z79899 Other long term (current) drug therapy: Secondary | ICD-10-CM | POA: Diagnosis not present

## 2017-03-12 DIAGNOSIS — J45909 Unspecified asthma, uncomplicated: Secondary | ICD-10-CM | POA: Diagnosis not present

## 2017-03-12 DIAGNOSIS — Y929 Unspecified place or not applicable: Secondary | ICD-10-CM | POA: Insufficient documentation

## 2017-03-12 DIAGNOSIS — F17210 Nicotine dependence, cigarettes, uncomplicated: Secondary | ICD-10-CM | POA: Diagnosis not present

## 2017-03-12 MED ORDER — OXYCODONE-ACETAMINOPHEN 5-325 MG PO TABS: 1.0000 | ORAL_TABLET | Freq: Four times a day (QID) | ORAL | 0 refills | Status: DC | PRN

## 2017-03-12 NOTE — Discharge Instructions (Signed)
Please seek medical attention for any high fevers, chest pain, shortness of breath, change in behavior, persistent vomiting, bloody stool or any other new or concerning symptoms.  
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
## 2017-03-26 ENCOUNTER — Encounter: Payer: Self-pay | Admitting: Emergency Medicine

## 2017-03-26 ENCOUNTER — Emergency Department
Admission: EM | Admit: 2017-03-26 | Discharge: 2017-03-26 | Disposition: A | Discharge status: Home / Self Care | Payer: Medicaid Other | Attending: Emergency Medicine | Admitting: Emergency Medicine

## 2017-03-26 DIAGNOSIS — Z79899 Other long term (current) drug therapy: Secondary | ICD-10-CM | POA: Insufficient documentation

## 2017-03-26 DIAGNOSIS — J45909 Unspecified asthma, uncomplicated: Secondary | ICD-10-CM | POA: Diagnosis not present

## 2017-03-26 DIAGNOSIS — K852 Alcohol induced acute pancreatitis without necrosis or infection: Secondary | ICD-10-CM

## 2017-03-26 DIAGNOSIS — F17210 Nicotine dependence, cigarettes, uncomplicated: Secondary | ICD-10-CM | POA: Insufficient documentation

## 2017-03-26 DIAGNOSIS — R109 Unspecified abdominal pain: Secondary | ICD-10-CM | POA: Diagnosis present

## 2017-03-26 LAB — CBC
HEMATOCRIT: 41.9 % (ref 35.0–47.0)
Hemoglobin: 14.7 g/dL (ref 12.0–16.0)
MCH: 39.4 pg — AB (ref 26.0–34.0)
MCHC: 35.2 g/dL (ref 32.0–36.0)
MCV: 112 fL — AB (ref 80.0–100.0)
Platelets: 184 10*3/uL (ref 150–440)
RBC: 3.74 MIL/uL — ABNORMAL LOW (ref 3.80–5.20)
RDW: 15.2 % — AB (ref 11.5–14.5)
WBC: 10.6 10*3/uL (ref 3.6–11.0)

## 2017-03-26 LAB — COMPREHENSIVE METABOLIC PANEL
ALBUMIN: 4.2 g/dL (ref 3.5–5.0)
ALT: 20 U/L (ref 14–54)
AST: 21 U/L (ref 15–41)
Alkaline Phosphatase: 117 U/L (ref 38–126)
Anion gap: 10 (ref 5–15)
BUN: 8 mg/dL (ref 6–20)
CHLORIDE: 103 mmol/L (ref 101–111)
CO2: 25 mmol/L (ref 22–32)
CREATININE: 0.65 mg/dL (ref 0.44–1.00)
Calcium: 9.2 mg/dL (ref 8.9–10.3)
GFR calc Af Amer: >60 mL/min (ref >60)
GFR calc non Af Amer: >60 mL/min (ref >60)
GLUCOSE: 104 mg/dL — AB (ref 65–99)
POTASSIUM: 3.7 mmol/L (ref 3.5–5.1)
SODIUM: 138 mmol/L (ref 135–145)
Total Bilirubin: 1.3 mg/dL — ABNORMAL HIGH (ref 0.3–1.2)
Total Protein: 7.5 g/dL (ref 6.5–8.1)

## 2017-03-26 LAB — URINALYSIS, COMPLETE (UACMP) WITH MICROSCOPIC
Glucose, UA: NEGATIVE mg/dL
Hgb urine dipstick: NEGATIVE
Ketones, ur: 20 mg/dL — AB
Leukocytes, UA: NEGATIVE
Nitrite: NEGATIVE
Protein, ur: 100 mg/dL — AB
SPECIFIC GRAVITY, URINE: 1.028 (ref 1.005–1.030)
pH: 5 (ref 5.0–8.0)

## 2017-03-26 LAB — MAGNESIUM: MAGNESIUM: 2 mg/dL (ref 1.7–2.4)

## 2017-03-26 LAB — LIPASE, BLOOD: LIPASE: 224 U/L — AB (ref 11–51)

## 2017-03-26 LAB — ETHANOL

## 2017-03-26 MED ORDER — SODIUM CHLORIDE 0.9 % IV BOLUS (SEPSIS)
1000.0000 mL | Freq: Once | INTRAVENOUS | Status: AC
  Administered 2017-03-26: 1000 mL via INTRAVENOUS

## 2017-03-26 MED ORDER — HYDROMORPHONE HCL 1 MG/ML IJ SOLN
1.0000 mg | Freq: Once | INTRAMUSCULAR | Status: AC
  Administered 2017-03-26: 1 mg via INTRAVENOUS
  Filled 2017-03-26: qty 1

## 2017-03-26 MED ORDER — ONDANSETRON HCL 4 MG PO TABS: 4.0000 mg | ORAL_TABLET | Freq: Every day | ORAL | 0 refills | Status: DC | PRN

## 2017-03-26 MED ORDER — OXYCODONE-ACETAMINOPHEN 10-325 MG PO TABS: 1.0000 | ORAL_TABLET | Freq: Four times a day (QID) | ORAL | 0 refills | Status: DC | PRN

## 2017-03-26 MED ORDER — KETOROLAC TROMETHAMINE 30 MG/ML IJ SOLN
15.0000 mg | Freq: Once | INTRAMUSCULAR | Status: AC
Start: 2017-03-26 — End: 2017-03-26
  Administered 2017-03-26: 15 mg via INTRAVENOUS
  Filled 2017-03-26: qty 1

## 2017-03-26 NOTE — ED Notes (Signed)
Pt tolerating liquids.  

## 2017-03-26 NOTE — ED Notes (Signed)
Pt given water for PO challenge 

## 2017-03-26 NOTE — ED Provider Notes (Addendum)
[facility] Emergency Department Provider Note  ____________________________________________   I have reviewed the triage vital signs and the nursing notes.   HISTORY  Chief Complaint Abdominal Pain    HPI Terri Mann is a 41 y.o. female with a history of alcoholic pancreatitis states she had some injected posterior 3 days ago and since that time she's had epigastric abdominal pain with occasional vomiting. No bloody vomitus. No hematemesis. No fever no chills. This is consistent with multiple prior pancreatitis flares. Nothing makes it better food makes it worse doesn't radiate, sharp pain, significant severity to her appreciation.       Past Medical History:  Diagnosis Date  . Asthma   . Chronic back pain   . Thyroid disease     Patient Active Problem List   Diagnosis Date Noted  . Acute pancreatitis 08/11/2016  . Pancreatitis 08/10/2016    Past Surgical History:  Procedure Laterality Date  . tubial ligation       Prior to Admission medications   Medication Sig Start Date End Date Taking? Authorizing Provider  bisacodyl (DULCOLAX) 5 MG EC tablet Take 1 tablet (5 mg total) by mouth daily as needed for moderate constipation. 08/13/16   Altamese Dilling, MD  cetirizine (ZYRTEC) 10 MG tablet Take 10 mg by mouth daily.    [provider]  dicyclomine (BENTYL) 20 MG tablet Take 1 tablet (20 mg total) by mouth 3 (three) times daily as needed for spasms. 01/01/17 01/01/18  Myrna Blazer, MD  HYDROcodone-acetaminophen (NORCO) 5-325 MG tablet Take 1 tablet by mouth every 4 (four) hours as needed for moderate pain. 01/01/17   Schaevitz, Myra Rude, MD  HYDROcodone-acetaminophen (NORCO) 5-325 MG tablet Take 2 tablets by mouth every 6 (six) hours as needed for moderate pain. 01/01/17   Schaevitz, Myra Rude, MD  levothyroxine (SYNTHROID, LEVOTHROID) 100 MCG tablet Take 100 mcg by mouth daily before breakfast.    [provider]  ondansetron (ZOFRAN ODT) 4 MG disintegrating tablet Take 1 tablet (4 mg total) by mouth every 8 (eight) hours as needed for nausea or vomiting. 10/29/16   Sharman Cheek, MD  ondansetron (ZOFRAN ODT) 4 MG disintegrating tablet Take 1 tablet (4 mg total) by mouth every 8 (eight) hours as needed for nausea or vomiting. 11/12/16   Emily Filbert, MD  ondansetron (ZOFRAN) 4 MG tablet Take 1 tablet (4 mg total) by mouth every 8 (eight) hours as needed for nausea or vomiting. 08/10/16   Jennye Moccasin, MD  oxyCODONE-acetaminophen (PERCOCET) 7.5-325 MG tablet Take 1 tablet by mouth every 4 (four) hours as needed for severe pain. 11/12/16 11/12/17  Emily Filbert, MD  oxyCODONE-acetaminophen (ROXICET) 5-325 MG tablet Take 1 tablet by mouth every 6 (six) hours as needed for severe pain. 10/29/16   Sharman Cheek, MD  oxyCODONE-acetaminophen (ROXICET) 5-325 MG tablet Take 1 tablet by mouth every 6 (six) hours as needed for severe pain. 03/12/17   Phineas Semen, MD  pantoprazole (PROTONIX) 40 MG tablet Take 1 tablet (40 mg total) by mouth 2 (two) times daily before a meal. 08/13/16   Altamese Dilling, MD  pantoprazole (PROTONIX) 40 MG tablet Take 1 tablet (40 mg total) by mouth daily. 12/30/16 12/30/17  Willy Eddy, MD  promethazine (PHENERGAN) 12.5 MG tablet Take 1 tablet (12.5 mg total) by mouth every 6 (six) hours as needed for nausea or vomiting. 12/30/16   Willy Eddy, MD  sucralfate (CARAFATE) 1 g tablet Take 1 tablet (1  g total) by mouth 4 (four) times daily -  with meals and at bedtime. 08/13/16   Altamese DillingVachhani, Vaibhavkumar, MD    Allergies Patient has no known allergies.  No family history on file.  Social History Social History  Substance Use Topics  . Smoking status: Current Some Day Smoker    Packs/day: 2.00    Types: Cigarettes  . Smokeless tobacco: Never Used  . Alcohol use Yes     Comment: last use was mothers day    Review of  Systems Constitutional: No fever/chills Eyes: No visual changes. ENT: No sore throat. No stiff neck no neck pain Cardiovascular: Denies chest pain. Respiratory: Denies shortness of breath. Gastrointestinal:   +vomiting.  No diarrhea.  No constipation. Genitourinary: Negative for dysuria. Musculoskeletal: Negative lower extremity swelling Skin: Negative for rash. Neurological: Negative for severe headaches, focal weakness or numbness. 10-point ROS otherwise negative.  ____________________________________________   PHYSICAL EXAM:  VITAL SIGNS: ED Triage Vitals  Enc Vitals Group     BP 03/26/17 1355 (!) 137/99     Pulse Rate 03/26/17 1355 (!) 114     Resp 03/26/17 1355 20     Temp 03/26/17 1355 98.1 F (36.7 C)     Temp Source 03/26/17 1355 Oral     SpO2 03/26/17 1355 100 %     Weight 03/26/17 1351 170 lb (77.1 kg)     Height 03/26/17 1351 5\' 7"  (1.702 m)     Head Circumference --      Peak Flow --      Pain Score 03/26/17 1351 10     Pain Loc --      Pain Edu? --      Excl. in GC? --     Constitutional: Alert and oriented. Well appearing and in no acute distress. Eyes: Conjunctivae are normal. PERRL. EOMI. Head: Atraumatic. Nose: No congestion/rhinnorhea. Mouth/Throat: Mucous membranes are moist.  Oropharynx non-erythematous. Neck: No stridor.   Nontender with no meningismus Cardiovascular: Normal rate, regular rhythm. Grossly normal heart sounds.  Good peripheral circulation. Respiratory: Normal respiratory effort.  No retractions. Lungs CTAB. Abdominal: Soft and Tenderness palpation epigastric region with no guarding or rebound nonsurgical abdomen. No distention. No guarding no rebound Back:  There is no focal tenderness or step off.  there is no midline tenderness there are no lesions noted. there is no CVA tenderness Musculoskeletal: No lower extremity tenderness, no upper extremity tenderness. No joint effusions, no DVT signs strong distal pulses no  edema Neurologic:  Normal speech and language. No gross focal neurologic deficits are appreciated.  Skin:  Skin is warm, dry and intact. No rash noted. Psychiatric: Mood and affect are normal. Speech and behavior are normal.  ____________________________________________   LABS (all labs ordered are listed, but only abnormal results are displayed)  Labs Reviewed  LIPASE, BLOOD - Abnormal; Notable for the following:       Result Value   Lipase 224 (*)    All other components within normal limits  COMPREHENSIVE METABOLIC PANEL - Abnormal; Notable for the following:    Glucose, Bld 104 (*)    Total Bilirubin 1.3 (*)    All other components within normal limits  CBC - Abnormal; Notable for the following:    RBC 3.74 (*)    MCV 112.0 (*)    MCH 39.4 (*)    RDW 15.2 (*)    All other components within normal limits  URINALYSIS, COMPLETE (UACMP) WITH MICROSCOPIC - Abnormal; Notable for the following:  Color, Urine AMBER (*)    APPearance CLOUDY (*)    Bilirubin Urine SMALL (*)    Ketones, ur 20 (*)    Protein, ur 100 (*)    Bacteria, UA RARE (*)    Squamous Epithelial / LPF 6-30 (*)    All other components within normal limits  ETHANOL  MAGNESIUM   ____________________________________________  EKG  I personally interpreted any EKGs ordered by me or triage Normal sinus rhythm, rate 72 bpm no acute ST elevation or depression L AFB noted ____________________________________________  RADIOLOGY  I reviewed any imaging ordered by me or triage that were performed during my shift and, if possible, patient and/or family made aware of any abnormal findings. ____________________________________________   PROCEDURES  Procedure(s) performed: None  Procedures  Critical Care performed: None  ____________________________________________   INITIAL IMPRESSION / ASSESSMENT AND PLAN / ED COURSE  Pertinent labs & imaging results that were available during my care of the patient  were reviewed by me and considered in my medical decision making (see chart for details).  Patient with recurrent pancreatitis, brought on by alcohol drink alcoholpancreatitis. Abdomen is benign I don't see indicated imaging. The patient's lipase is mildly elevated. We have given her IV fluids and pain medications and she feels much better. We'll try by mouth challenge and see if we get her safely home. This is a very chronic recurrent problem for the patient. I have advised alcohol abstinence as she states that she doesn't normally drink but she did drink on Mother's Day. We have advised GI follow-up. We will see if we can get her safely home.    ____________________________________________   FINAL CLINICAL IMPRESSION(S) / ED DIAGNOSES  Final diagnoses:  None      This chart was dictated using voice recognition software.  Despite best efforts to proofread,  errors can occur which can change meaning.      Jeanmarie Plant, MD 03/26/17 1645    Jeanmarie Plant, MD 03/26/17 [id number]

## 2017-03-26 NOTE — ED Triage Notes (Signed)
Pt comes into the ED via PO[facility] c/o LUQ abdominal pain that has been going on for 3 days.  Patient has significant h/o of pancreatitis.  Patient's last ETOH use was on mothers day.  Patient explains she has had constant nausea and vomiting.  Denies and chest pain or shortness of breath.

## 2017-04-15 ENCOUNTER — Emergency Department
Admission: EM | Admit: 2017-04-15 | Discharge: 2017-04-16 | Disposition: A | Discharge status: Home / Self Care | Payer: Medicaid Other | Attending: Emergency Medicine | Admitting: Emergency Medicine

## 2017-04-15 ENCOUNTER — Emergency Department: Payer: Medicaid Other

## 2017-04-15 DIAGNOSIS — Z79899 Other long term (current) drug therapy: Secondary | ICD-10-CM | POA: Insufficient documentation

## 2017-04-15 DIAGNOSIS — N939 Abnormal uterine and vaginal bleeding, unspecified: Secondary | ICD-10-CM | POA: Diagnosis not present

## 2017-04-15 DIAGNOSIS — R102 Pelvic and perineal pain: Secondary | ICD-10-CM | POA: Insufficient documentation

## 2017-04-15 DIAGNOSIS — K859 Acute pancreatitis without necrosis or infection, unspecified: Secondary | ICD-10-CM | POA: Diagnosis not present

## 2017-04-15 DIAGNOSIS — E079 Disorder of thyroid, unspecified: Secondary | ICD-10-CM | POA: Diagnosis not present

## 2017-04-15 DIAGNOSIS — J45909 Unspecified asthma, uncomplicated: Secondary | ICD-10-CM | POA: Diagnosis not present

## 2017-04-15 DIAGNOSIS — F17210 Nicotine dependence, cigarettes, uncomplicated: Secondary | ICD-10-CM | POA: Diagnosis not present

## 2017-04-15 DIAGNOSIS — R1010 Upper abdominal pain, unspecified: Secondary | ICD-10-CM | POA: Diagnosis present

## 2017-04-15 DIAGNOSIS — R1011 Right upper quadrant pain: Secondary | ICD-10-CM

## 2017-04-15 HISTORY — DX: Acute pancreatitis without necrosis or infection, unspecified: K85.90

## 2017-04-15 LAB — URINALYSIS, COMPLETE (UACMP) WITH MICROSCOPIC
BACTERIA UA: NONE SEEN
Glucose, UA: NEGATIVE mg/dL
Ketones, ur: 20 mg/dL — AB
Leukocytes, UA: NEGATIVE
NITRITE: NEGATIVE
PROTEIN: 100 mg/dL — AB
Specific Gravity, Urine: 1.032 — ABNORMAL HIGH (ref 1.005–1.030)
pH: 5 (ref 5.0–8.0)

## 2017-04-15 LAB — COMPREHENSIVE METABOLIC PANEL
ALBUMIN: 4.2 g/dL (ref 3.5–5.0)
ALT: 38 U/L (ref 14–54)
AST: 53 U/L — AB (ref 15–41)
Alkaline Phosphatase: 140 U/L — ABNORMAL HIGH (ref 38–126)
Anion gap: 11 (ref 5–15)
BILIRUBIN TOTAL: 1.8 mg/dL — AB (ref 0.3–1.2)
BUN: 7 mg/dL (ref 6–20)
CO2: 26 mmol/L (ref 22–32)
Calcium: 9.4 mg/dL (ref 8.9–10.3)
Chloride: 98 mmol/L — ABNORMAL LOW (ref 101–111)
Creatinine, Ser: 0.53 mg/dL (ref 0.44–1.00)
GFR calc Af Amer: >60 mL/min (ref >60)
GFR calc non Af Amer: >60 mL/min (ref >60)
GLUCOSE: 109 mg/dL — AB (ref 65–99)
POTASSIUM: 3.6 mmol/L (ref 3.5–5.1)
SODIUM: 135 mmol/L (ref 135–145)
TOTAL PROTEIN: 7.6 g/dL (ref 6.5–8.1)

## 2017-04-15 LAB — CBC
HEMATOCRIT: 43.1 % (ref 35.0–47.0)
HEMOGLOBIN: 15.1 g/dL (ref 12.0–16.0)
MCH: 38.6 pg — AB (ref 26.0–34.0)
MCHC: 35 g/dL (ref 32.0–36.0)
MCV: 110.2 fL — AB (ref 80.0–100.0)
Platelets: 206 10*3/uL (ref 150–440)
RBC: 3.91 MIL/uL (ref 3.80–5.20)
RDW: 14.9 % — AB (ref 11.5–14.5)
WBC: 9.8 10*3/uL (ref 3.6–11.0)

## 2017-04-15 LAB — LIPASE, BLOOD: Lipase: 169 U/L — ABNORMAL HIGH (ref 11–51)

## 2017-04-15 MED ORDER — ONDANSETRON HCL 4 MG/2ML IJ SOLN
4.0000 mg | Freq: Once | INTRAMUSCULAR | Status: AC
  Administered 2017-04-15: 4 mg via INTRAVENOUS
  Filled 2017-04-15: qty 2

## 2017-04-15 MED ORDER — MORPHINE SULFATE (PF) 4 MG/ML IV SOLN
4.0000 mg | Freq: Once | INTRAVENOUS | Status: AC
  Administered 2017-04-15: 4 mg via INTRAVENOUS
  Filled 2017-04-15: qty 1

## 2017-04-15 MED ORDER — ONDANSETRON HCL 4 MG/2ML IJ SOLN
INTRAMUSCULAR | Status: AC
  Filled 2017-04-15: qty 2

## 2017-04-15 MED ORDER — ONDANSETRON HCL 4 MG/2ML IJ SOLN
4.0000 mg | Freq: Once | INTRAMUSCULAR | Status: AC | PRN
  Administered 2017-04-15: 4 mg via INTRAVENOUS

## 2017-04-15 MED ORDER — SODIUM CHLORIDE 0.9 % IV BOLUS (SEPSIS)
1000.0000 mL | Freq: Once | INTRAVENOUS | Status: AC
  Administered 2017-04-15: 1000 mL via INTRAVENOUS

## 2017-04-15 MED ORDER — MORPHINE SULFATE (PF) 4 MG/ML IV SOLN
INTRAVENOUS | Status: AC
  Filled 2017-04-15: qty 1

## 2017-04-15 MED ORDER — MORPHINE SULFATE (PF) 4 MG/ML IV SOLN
4.0000 mg | Freq: Once | INTRAVENOUS | Status: AC
  Administered 2017-04-15: 4 mg via INTRAVENOUS

## 2017-04-15 NOTE — ED Triage Notes (Signed)
Pt arrived via [facility], states she is having generalized abdominal pain.  Pt reports recently being dx with pancreatitis.  Pt also states that she is having uterine cramping at this time.  Pt reports having numerous episodes of vomiting.  Pt tearful in triage.  Pt states that she has an appt with a specialist, but has not seen anyone yet for this new diagnosis.

## 2017-04-15 NOTE — ED Provider Notes (Signed)
[facility] Emergency Department Provider Note   First MD Initiated Contact with Patient 04/15/17 2315     (approximate)  I have reviewed the triage vital signs and the nursing notes.   HISTORY  Chief Complaint Abdominal Pain   HPI Terri Mann is a 41 y.o. female with below list chronic medical conditions including multiple episodes of alcoholic induced pancreatitis presents to the emergency department with one-day history of upper abdominal pain associated with multiple episodes of nonbloody emesis. Patient says her current pain score sharp 8 out of 10. Patient also admits to pelvic cramping with vaginal bleeding which began yesterday. Patient states last menses concluded last week.   Past Medical History:  Diagnosis Date  . Asthma   . Chronic back pain   . Pancreatitis   . Thyroid disease     Patient Active Problem List   Diagnosis Date Noted  . Acute pancreatitis 08/11/2016  . Pancreatitis 08/10/2016    Past Surgical History:  Procedure Laterality Date  . tubial ligation       Prior to Admission medications   Medication Sig Start Date End Date Taking? Authorizing Provider  bisacodyl (DULCOLAX) 5 MG EC tablet Take 1 tablet (5 mg total) by mouth daily as needed for moderate constipation. 08/13/16   Altamese Dilling, MD  cetirizine (ZYRTEC) 10 MG tablet Take 10 mg by mouth daily.    [provider]  dicyclomine (BENTYL) 20 MG tablet Take 1 tablet (20 mg total) by mouth 3 (three) times daily as needed for spasms. 01/01/17 01/01/18  Myrna Blazer, MD  HYDROcodone-acetaminophen (NORCO) 5-325 MG tablet Take 1 tablet by mouth every 4 (four) hours as needed for moderate pain. 01/01/17   Schaevitz, Myra Rude, MD  HYDROcodone-acetaminophen (NORCO) 5-325 MG tablet Take 2 tablets by mouth every 6 (six) hours as needed for moderate pain. 01/01/17   Schaevitz, Myra Rude, MD  levothyroxine (SYNTHROID, LEVOTHROID) 100 MCG tablet  Take 1 tablet (100 mcg total) by mouth daily before breakfast. 04/16/17   Darci Current, MD  ondansetron (ZOFRAN ODT) 4 MG disintegrating tablet Take 1 tablet (4 mg total) by mouth every 8 (eight) hours as needed for nausea or vomiting. 10/29/16   Sharman Cheek, MD  ondansetron (ZOFRAN ODT) 4 MG disintegrating tablet Take 1 tablet (4 mg total) by mouth every 8 (eight) hours as needed for nausea or vomiting. 11/12/16   Emily Filbert, MD  ondansetron (ZOFRAN ODT) 4 MG disintegrating tablet Take 1 tablet (4 mg total) by mouth every 8 (eight) hours as needed for nausea or vomiting. 04/16/17   Darci Current, MD  ondansetron (ZOFRAN) 4 MG tablet Take 1 tablet (4 mg total) by mouth daily as needed for nausea or vomiting. 03/26/17   Jeanmarie Plant, MD  oxyCODONE-acetaminophen (PERCOCET) 10-325 MG tablet Take 1 tablet by mouth every 6 (six) hours as needed for pain. 03/26/17 03/26/18  Jeanmarie Plant, MD  oxyCODONE-acetaminophen (ROXICET) 5-325 MG tablet Take 1 tablet by mouth every 4 (four) hours as needed for severe pain. 04/16/17   Darci Current, MD  pantoprazole (PROTONIX) 40 MG tablet Take 1 tablet (40 mg total) by mouth 2 (two) times daily before a meal. 08/13/16   Altamese Dilling, MD  pantoprazole (PROTONIX) 40 MG tablet Take 1 tablet (40 mg total) by mouth daily. 12/30/16 12/30/17  Willy Eddy, MD  promethazine (PHENERGAN) 12.5 MG tablet Take 1 tablet (12.5 mg total) by mouth every 6 (six) hours as  needed for nausea or vomiting. 12/30/16   Willy Eddy, MD  sucralfate (CARAFATE) 1 g tablet Take 1 tablet (1 g total) by mouth 4 (four) times daily -  with meals and at bedtime. 08/13/16   Altamese Dilling, MD    Allergies No known drug allergies  Family history Noncontributory  Social History Social History  Substance Use Topics  . Smoking status: Current Some Day Smoker    Packs/day: 2.00    Types: Cigarettes  . Smokeless tobacco: Never Used  . Alcohol use Yes       Comment: socially    Review of Systems Constitutional: No fever/chills Eyes: No visual changes. ENT: No sore throat. Cardiovascular: Denies chest pain. Respiratory: Denies shortness of breath. Gastrointestinal:Positive for abdominal pain and vomiting Genitourinary: Negative for dysuria. Positive for pelvic pain and vaginal bleeding Musculoskeletal: Negative for neck pain.  Negative for back pain. Integumentary: Negative for rash. Neurological: Negative for headaches, focal weakness or numbness.   ____________________________________________   PHYSICAL EXAM:  VITAL SIGNS: ED Triage Vitals  Enc Vitals Group     BP 04/15/17 2148 (!) 124/97     Pulse Rate 04/15/17 2148 (!) 123     Resp 04/15/17 2148 (!) 24     Temp 04/15/17 2148 98.3 F (36.8 C)     Temp Source 04/15/17 2148 Oral     SpO2 04/15/17 2148 98 %     Weight 04/15/17 2149 77.1 kg (170 lb)     Height 04/15/17 2149 1.702 m (5\' 7" )     Head Circumference --      Peak Flow --      Pain Score 04/15/17 2146 10     Pain Loc --      Pain Edu? --      Excl. in GC? --     Constitutional: Alert and oriented. Well appearing and in no acute distress. Eyes: Conjunctivae are normal. Head: Atraumatic. Mouth/Throat: Mucous membranes are moist.  Oropharynx non-erythematous. Neck: No stridor.   Cardiovascular: Normal rate, regular rhythm. Good peripheral circulation. Grossly normal heart sounds. Respiratory: Normal respiratory effort.  No retractions. Lungs CTAB. Gastrointestinal: Positive for right upper quadrant/left upper quadrant/epigastric discomfort with palpation.. No distention.  Musculoskeletal: No lower extremity tenderness nor edema. No gross deformities of extremities. Neurologic:  Normal speech and language. No gross focal neurologic deficits are appreciated.  Skin:  Skin is warm, dry and intact. No rash noted. Psychiatric: Mood and affect are normal. Speech and behavior are  normal.  ____________________________________________   LABS (all labs ordered are listed, but only abnormal results are displayed)  Labs Reviewed  LIPASE, BLOOD - Abnormal; Notable for the following:       Result Value   Lipase 169 (*)    All other components within normal limits  COMPREHENSIVE METABOLIC PANEL - Abnormal; Notable for the following:    Chloride 98 (*)    Glucose, Bld 109 (*)    AST 53 (*)    Alkaline Phosphatase 140 (*)    Total Bilirubin 1.8 (*)    All other components within normal limits  CBC - Abnormal; Notable for the following:    MCV 110.2 (*)    MCH 38.6 (*)    RDW 14.9 (*)    All other components within normal limits  URINALYSIS, COMPLETE (UACMP) WITH MICROSCOPIC - Abnormal; Notable for the following:    Color, Urine AMBER (*)    APPearance HAZY (*)    Specific Gravity, Urine 1.032 (*)  Hgb urine dipstick SMALL (*)    Bilirubin Urine SMALL (*)    Ketones, ur 20 (*)    Protein, ur 100 (*)    Squamous Epithelial / LPF 6-30 (*)    All other components within normal limits  ETHANOL   ____________________________________________  EKG  ED ECG REPORT I, [place] N BROWN, the attending physician, personally viewed and interpreted this ECG.   Date: 04/16/2017  EKG Time: 9:48 PM  Rate: 112  Rhythm: Sinus tachycardia  Axis: Normal  Intervals: Normal  ST&T Change: None  ____________________________________________  RADIOLOGY I, [place] N BROWN, personally viewed and evaluated these images (plain radiographs) as part of my medical decision making, as well as reviewing the written report by the radiologist.  [place] Transvaginal Non-ob  Result Date: 04/16/2017 CLINICAL DATA:  Initial evaluation for acute pelvic pain with vaginal bleeding. EXAM: TRANSABDOMINAL AND TRANSVAGINAL ULTRASOUND OF PELVIS TECHNIQUE: Both transabdominal and transvaginal ultrasound examinations of the pelvis were performed. Transabdominal technique was performed for global  imaging of the pelvis including uterus, ovaries, adnexal regions, and pelvic cul-de-sac. It was necessary to proceed with endovaginal exam following the transabdominal exam to visualize the uterus and ovaries. COMPARISON:  None FINDINGS: Uterus Measurements: 7.3 x 3.0 x 4.1 cm. No fibroids or other mass visualized. Endometrium Thickness: 5.1 mm.  No focal abnormality visualized. Right ovary Measurements: 2.2 x 1.7 x 1.7 cm. Normal appearance/no adnexal mass. Left ovary Measurements: 2.5 x 1.9 x 2.2 cm. Normal appearance/no adnexal mass. Other findings No abnormal free fluid. IMPRESSION: Normal pelvic ultrasound.  No acute abnormality identified. Electronically Signed   By: Rise MuBenjamin  McClintock M.D.   On: 04/16/2017 01:13   [place] Pelvis Complete  Result Date: 04/16/2017 CLINICAL DATA:  Initial evaluation for acute pelvic pain with vaginal bleeding. EXAM: TRANSABDOMINAL AND TRANSVAGINAL ULTRASOUND OF PELVIS TECHNIQUE: Both transabdominal and transvaginal ultrasound examinations of the pelvis were performed. Transabdominal technique was performed for global imaging of the pelvis including uterus, ovaries, adnexal regions, and pelvic cul-de-sac. It was necessary to proceed with endovaginal exam following the transabdominal exam to visualize the uterus and ovaries. COMPARISON:  None FINDINGS: Uterus Measurements: 7.3 x 3.0 x 4.1 cm. No fibroids or other mass visualized. Endometrium Thickness: 5.1 mm.  No focal abnormality visualized. Right ovary Measurements: 2.2 x 1.7 x 1.7 cm. Normal appearance/no adnexal mass. Left ovary Measurements: 2.5 x 1.9 x 2.2 cm. Normal appearance/no adnexal mass. Other findings No abnormal free fluid. IMPRESSION: Normal pelvic ultrasound.  No acute abnormality identified. Electronically Signed   By: Rise MuBenjamin  McClintock M.D.   On: 04/16/2017 01:13   [place] Abdomen Limited Ruq  Result Date: 04/16/2017 CLINICAL DATA:  Initial evaluation for acute right upper quadrant pain for 2 days. EXAM:  ULTRASOUND ABDOMEN LIMITED RIGHT UPPER QUADRANT COMPARISON:  Prior ultrasound from 10/29/2016 and CT from 06/03/2016. FINDINGS: Gallbladder: No gallstones or wall thickening visualized. No sonographic Murphy sign noted by sonographer. Common bile duct: Diameter: 3.2 mm Liver: No focal lesion identified. Mildly heterogeneous but within normal limits for echogenicity. IMPRESSION: Normal right upper quadrant ultrasound. No sonographic evidence for cholelithiasis, acute cholecystitis, or biliary dilatation. Electronically Signed   By: Rise MuBenjamin  McClintock M.D.   On: 04/16/2017 01:16    _________________  Procedures   ____________________________________________   INITIAL IMPRESSION / ASSESSMENT AND PLAN / ED COURSE  Pertinent labs & imaging results that were available during my care of the patient were reviewed by me and considered in my medical decision making (see chart for details).  41 year old female presents to the emergency department history physical exam and laboratory data consistent with acute pancreatitis. Patient received IV morphine and Dilaudid in the emergency department marked improvement of pain.      ____________________________________________  FINAL CLINICAL IMPRESSION(S) / ED DIAGNOSES  Final diagnoses:  Pelvic pain  RUQ pain  Acute pancreatitis, unspecified complication status, unspecified pancreatitis type     MEDICATIONS GIVEN DURING THIS VISIT:  Medications  ondansetron (ZOFRAN) injection 4 mg (4 mg Intravenous Given 04/15/17 2208)  morphine 4 MG/ML injection 4 mg (4 mg Intravenous Given 04/15/17 2208)  sodium chloride 0.9 % bolus 1,000 mL (0 mLs Intravenous Stopped 04/15/17 2342)  morphine 4 MG/ML injection 4 mg (4 mg Intravenous Given 04/15/17 2342)  ondansetron (ZOFRAN) injection 4 mg (4 mg Intravenous Given 04/15/17 2342)  HYDROmorphone (DILAUDID) injection 1 mg (1 mg Intravenous Given 04/16/17 0109)  oxyCODONE-acetaminophen (PERCOCET/ROXICET) 5-325 MG per  tablet 1 tablet (1 tablet Oral Given 04/16/17 0308)     NEW OUTPATIENT MEDICATIONS STARTED DURING THIS VISIT:  Discharge Medication List as of 04/16/2017  3:04 AM    START taking these medications   Details  !! ondansetron (ZOFRAN ODT) 4 MG disintegrating tablet Take 1 tablet (4 mg total) by mouth every 8 (eight) hours as needed for nausea or vomiting., Starting Wed 04/16/2017, Print    oxyCODONE-acetaminophen (ROXICET) 5-325 MG tablet Take 1 tablet by mouth every 4 (four) hours as needed for severe pain., Starting Wed 04/16/2017, Print     !! - Potential duplicate medications found. Please discuss with provider.      Discharge Medication List as of 04/16/2017  3:04 AM    CONTINUE these medications which have CHANGED   Details  levothyroxine (SYNTHROID, LEVOTHROID) 100 MCG tablet Take 1 tablet (100 mcg total) by mouth daily before breakfast., Starting Wed 04/16/2017, Print        Discharge Medication List as of 04/16/2017  3:04 AM       Note:  This document was prepared using Dragon voice recognition software and may include unintentional dictation errors.    Darci Current, MD 04/16/17 [phone]

## 2017-04-15 NOTE — ED Notes (Signed)
Pt reports LMP was 5 days ago however states she has developed pelvic cramps this am and started vaginal bleeding. Pt states the blood is light amt. Pt also states her urine is orange. Pt appears uncomfortable at this time and states she can't get comfortable.

## 2017-04-16 LAB — ETHANOL: Alcohol, Ethyl (B): <5 mg/dL (ref <5)

## 2017-04-16 MED ORDER — OXYCODONE-ACETAMINOPHEN 5-325 MG PO TABS
1.0000 | ORAL_TABLET | Freq: Once | ORAL | Status: AC
  Administered 2017-04-16: 1 via ORAL
  Filled 2017-04-16: qty 1

## 2017-04-16 MED ORDER — LEVOTHYROXINE SODIUM 100 MCG PO TABS: 100.0000 ug | ORAL_TABLET | Freq: Every day | ORAL | 0 refills | Status: DC

## 2017-04-16 MED ORDER — ONDANSETRON 4 MG PO TBDP: 4.0000 mg | ORAL_TABLET | Freq: Three times a day (TID) | ORAL | 0 refills | Status: DC | PRN

## 2017-04-16 MED ORDER — HYDROMORPHONE HCL 1 MG/ML IJ SOLN
1.0000 mg | Freq: Once | INTRAMUSCULAR | Status: AC
  Administered 2017-04-16: 1 mg via INTRAVENOUS
  Filled 2017-04-16: qty 1

## 2017-04-16 MED ORDER — OXYCODONE-ACETAMINOPHEN 5-325 MG PO TABS: 1.0000 | ORAL_TABLET | ORAL | 0 refills | Status: DC | PRN

## 2017-04-16 NOTE — ED Notes (Signed)
Pt reports her son is on the way to pick her up for discharge. Pt A&O at this time and in NAD.

## 2017-04-16 NOTE — ED Notes (Signed)

## 2017-04-16 NOTE — ED Notes (Signed)
Patient transported to Ultrasound 

## 2017-06-09 ENCOUNTER — Emergency Department
Admission: EM | Admit: 2017-06-09 | Discharge: 2017-06-09 | Disposition: A | Discharge status: Home / Self Care | Payer: Medicaid Other | Attending: Emergency Medicine | Admitting: Emergency Medicine

## 2017-06-09 ENCOUNTER — Encounter: Payer: Self-pay | Admitting: Emergency Medicine

## 2017-06-09 DIAGNOSIS — Z79899 Other long term (current) drug therapy: Secondary | ICD-10-CM | POA: Diagnosis not present

## 2017-06-09 DIAGNOSIS — R1013 Epigastric pain: Secondary | ICD-10-CM | POA: Diagnosis present

## 2017-06-09 DIAGNOSIS — K859 Acute pancreatitis without necrosis or infection, unspecified: Secondary | ICD-10-CM | POA: Diagnosis not present

## 2017-06-09 DIAGNOSIS — J45909 Unspecified asthma, uncomplicated: Secondary | ICD-10-CM | POA: Diagnosis not present

## 2017-06-09 DIAGNOSIS — F17210 Nicotine dependence, cigarettes, uncomplicated: Secondary | ICD-10-CM | POA: Diagnosis not present

## 2017-06-09 LAB — URINALYSIS, COMPLETE (UACMP) WITH MICROSCOPIC
Bilirubin Urine: NEGATIVE
GLUCOSE, UA: NEGATIVE mg/dL
Hgb urine dipstick: NEGATIVE
Ketones, ur: 80 mg/dL — AB
Leukocytes, UA: NEGATIVE
NITRITE: NEGATIVE
Protein, ur: 100 mg/dL — AB
SPECIFIC GRAVITY, URINE: 1.021 (ref 1.005–1.030)
pH: 6 (ref 5.0–8.0)

## 2017-06-09 LAB — COMPREHENSIVE METABOLIC PANEL
ALT: 40 U/L (ref 14–54)
ANION GAP: 12 (ref 5–15)
AST: 59 U/L — ABNORMAL HIGH (ref 15–41)
Albumin: 4 g/dL (ref 3.5–5.0)
Alkaline Phosphatase: 133 U/L — ABNORMAL HIGH (ref 38–126)
BUN: 11 mg/dL (ref 6–20)
CALCIUM: 9 mg/dL (ref 8.9–10.3)
CO2: 28 mmol/L (ref 22–32)
CREATININE: 0.69 mg/dL (ref 0.44–1.00)
Chloride: 97 mmol/L — ABNORMAL LOW (ref 101–111)
GFR calc non Af Amer: >60 mL/min (ref >60)
Glucose, Bld: 88 mg/dL (ref 65–99)
Potassium: 3.6 mmol/L (ref 3.5–5.1)
SODIUM: 137 mmol/L (ref 135–145)
TOTAL PROTEIN: 7.5 g/dL (ref 6.5–8.1)
Total Bilirubin: 1.8 mg/dL — ABNORMAL HIGH (ref 0.3–1.2)

## 2017-06-09 LAB — CBC
HCT: 43.4 % (ref 35.0–47.0)
HEMOGLOBIN: 15 g/dL (ref 12.0–16.0)
MCH: 37.7 pg — AB (ref 26.0–34.0)
MCHC: 34.5 g/dL (ref 32.0–36.0)
MCV: 109.3 fL — ABNORMAL HIGH (ref 80.0–100.0)
PLATELETS: 151 10*3/uL (ref 150–440)
RBC: 3.97 MIL/uL (ref 3.80–5.20)
RDW: 19 % — ABNORMAL HIGH (ref 11.5–14.5)
WBC: 7.2 10*3/uL (ref 3.6–11.0)

## 2017-06-09 LAB — LIPASE, BLOOD: LIPASE: 67 U/L — AB (ref 11–51)

## 2017-06-09 LAB — POCT PREGNANCY, URINE: Preg Test, Ur: NEGATIVE

## 2017-06-09 MED ORDER — MORPHINE SULFATE (PF) 4 MG/ML IV SOLN
4.0000 mg | Freq: Once | INTRAVENOUS | Status: AC
Start: 2017-06-09 — End: 2017-06-09
  Administered 2017-06-09: 4 mg via INTRAVENOUS
  Filled 2017-06-09: qty 1

## 2017-06-09 MED ORDER — TRAMADOL HCL 50 MG PO TABS
50.0000 mg | ORAL_TABLET | Freq: Once | ORAL | Status: AC
  Administered 2017-06-09: 50 mg via ORAL

## 2017-06-09 MED ORDER — TRAMADOL HCL 50 MG PO TABS
ORAL_TABLET | ORAL | Status: AC
  Administered 2017-06-09: 50 mg via ORAL
  Filled 2017-06-09: qty 1

## 2017-06-09 MED ORDER — SODIUM CHLORIDE 0.9 % IV BOLUS (SEPSIS)
1000.0000 mL | Freq: Once | INTRAVENOUS | Status: AC
  Administered 2017-06-09: 1000 mL via INTRAVENOUS

## 2017-06-09 MED ORDER — TRAMADOL HCL 50 MG PO TABS: 50.0000 mg | ORAL_TABLET | Freq: Four times a day (QID) | ORAL | 0 refills | Status: DC | PRN

## 2017-06-09 MED ORDER — ONDANSETRON HCL 4 MG/2ML IJ SOLN
4.0000 mg | Freq: Once | INTRAMUSCULAR | Status: AC
  Administered 2017-06-09: 4 mg via INTRAVENOUS
  Filled 2017-06-09: qty 2

## 2017-06-09 NOTE — ED Notes (Signed)
This RN to bedside, pt requesting something for pain, states pain is returning. NAD noted. Resting in bed watching TV. VSS and WNL.

## 2017-06-09 NOTE — ED Provider Notes (Signed)
[facility] Emergency Department Provider Note  Time seen: 8:19 PM  I have reviewed the triage vital signs and the nursing notes.   HISTORY  Chief Complaint No chief complaint on file.    HPI Terri Mann is a 41 y.o. female with a past medical history of asthma, chronic pancreatitis, alcohol use, presents to the emergency department for upper abdominal discomfort. According to the patient for the past several days she has been experiencing progressively worsening upper abdominal discomfort which seems to radiate to her mid back. She also states she has been having some mild dysuria as well. Denies fever. Denies vomiting or diarrhea but does state nausea. Patient states she did have 2 alcoholic drinks 3 days ago but has not had any alcohol since.  Past Medical History:  Diagnosis Date  . Asthma   . Chronic back pain   . Pancreatitis   . Thyroid disease     Patient Active Problem List   Diagnosis Date Noted  . Acute pancreatitis 08/11/2016  . Pancreatitis 08/10/2016    Past Surgical History:  Procedure Laterality Date  . tubial ligation       Prior to Admission medications   Medication Sig Start Date End Date Taking? Authorizing Provider  bisacodyl (DULCOLAX) 5 MG EC tablet Take 1 tablet (5 mg total) by mouth daily as needed for moderate constipation. 08/13/16   Altamese DillingVachhani, Vaibhavkumar, MD  cetirizine (ZYRTEC) 10 MG tablet Take 10 mg by mouth daily.    [provider]  dicyclomine (BENTYL) 20 MG tablet Take 1 tablet (20 mg total) by mouth 3 (three) times daily as needed for spasms. 01/01/17 01/01/18  Myrna BlazerSchaevitz, David Matthew, MD  HYDROcodone-acetaminophen (NORCO) 5-325 MG tablet Take 1 tablet by mouth every 4 (four) hours as needed for moderate pain. 01/01/17   Schaevitz, Myra Rudeavid Matthew, MD  HYDROcodone-acetaminophen (NORCO) 5-325 MG tablet Take 2 tablets by mouth every 6 (six) hours as needed for moderate pain. 01/01/17   Schaevitz, Myra Rudeavid Matthew,  MD  levothyroxine (SYNTHROID, LEVOTHROID) 100 MCG tablet Take 1 tablet (100 mcg total) by mouth daily before breakfast. 04/16/17   Darci CurrentBrown, Cherry N, MD  ondansetron (ZOFRAN ODT) 4 MG disintegrating tablet Take 1 tablet (4 mg total) by mouth every 8 (eight) hours as needed for nausea or vomiting. 10/29/16   Sharman CheekStafford, Phillip, MD  ondansetron (ZOFRAN ODT) 4 MG disintegrating tablet Take 1 tablet (4 mg total) by mouth every 8 (eight) hours as needed for nausea or vomiting. 11/12/16   Emily FilbertWilliams, Jonathan E, MD  ondansetron (ZOFRAN ODT) 4 MG disintegrating tablet Take 1 tablet (4 mg total) by mouth every 8 (eight) hours as needed for nausea or vomiting. 04/16/17   Darci CurrentBrown, [place] N, MD  ondansetron (ZOFRAN) 4 MG tablet Take 1 tablet (4 mg total) by mouth daily as needed for nausea or vomiting. 03/26/17   Jeanmarie PlantMcShane, James A, MD  oxyCODONE-acetaminophen (PERCOCET) 10-325 MG tablet Take 1 tablet by mouth every 6 (six) hours as needed for pain. 03/26/17 03/26/18  Jeanmarie PlantMcShane, James A, MD  oxyCODONE-acetaminophen (ROXICET) 5-325 MG tablet Take 1 tablet by mouth every 4 (four) hours as needed for severe pain. 04/16/17   Darci CurrentBrown, Sarles N, MD  pantoprazole (PROTONIX) 40 MG tablet Take 1 tablet (40 mg total) by mouth 2 (two) times daily before a meal. 08/13/16   Altamese DillingVachhani, Vaibhavkumar, MD  pantoprazole (PROTONIX) 40 MG tablet Take 1 tablet (40 mg total) by mouth daily. 12/30/16 12/30/17  Willy Eddyobinson, Patrick, MD  promethazine [facility](PHENERGAN)  12.5 MG tablet Take 1 tablet (12.5 mg total) by mouth every 6 (six) hours as needed for nausea or vomiting. 12/30/16   Willy Eddyobinson, Patrick, MD  sucralfate (CARAFATE) 1 g tablet Take 1 tablet (1 g total) by mouth 4 (four) times daily -  with meals and at bedtime. 08/13/16   Altamese DillingVachhani, Vaibhavkumar, MD    No Known Allergies  History reviewed. No pertinent family history.  Social History Social History  Substance Use Topics  . Smoking status: Current Some Day Smoker    Packs/day: 2.00    Types:  Cigarettes  . Smokeless tobacco: Never Used  . Alcohol use Yes     Comment: socially    Review of Systems Constitutional: Negative for fever. Cardiovascular: Negative for chest pain. Respiratory: Negative for shortness of breath. Gastrointestinal: Upper abdominal pain. Mid back pain. Positive for nausea. Negative vomiting or diarrhea. Genitourinary: Mild Dysuria. Negative for vaginal bleeding or discharge Musculoskeletal: Mild mid back pain Neurological: Negative for headache All other ROS negative  ____________________________________________   PHYSICAL EXAM:  VITAL SIGNS: ED Triage Vitals [06/09/17 1917]  Enc Vitals Group     BP (!) 151/100     Pulse Rate 88     Resp 17     Temp 98.1 F (36.7 C)     Temp Source Oral     SpO2 98 %     Weight 170 lb (77.1 kg)     Height 5\' 8"  (1.727 m)     Head Circumference      Peak Flow      Pain Score      Pain Loc      Pain Edu?      Excl. in GC?     Constitutional: Alert and oriented. Well appearing and in no distress. Eyes: Normal exam ENT   Head: Normocephalic and atraumatic.   Mouth/Throat: Mucous membranes are moist. Cardiovascular: Normal rate, regular rhythm. No murmur Respiratory: Normal respiratory effort without tachypnea nor retractions. Breath sounds are clear  Gastrointestinal: Soft, mild epigastric tenderness palpation. No rebound or guarding. No distention. Abdomen otherwise benign. Musculoskeletal: Nontender with normal range of motion in all extremities.  Neurologic:  Normal speech and language. No gross focal neurologic deficits  Skin:  Skin is warm, dry and intact.  Psychiatric: Mood and affect are normal.   ____________________________________________   INITIAL IMPRESSION / ASSESSMENT AND PLAN / ED COURSE  Pertinent labs & imaging results that were available during my care of the patient were reviewed by me and considered in my medical decision making (see chart for details).  Patient  presents to the emergency department for upper abdominal discomfort. Patient's labs show a mild elevation of her lipase of 67 however this is significantly declined from previous values this year. Normal white blood cell count. Patient does state mid back pain with mild dysuria we will check a urinalysis. We'll IV hydrate. We'll dose 1 dose of IV pain medication while awaiting workup. Patient agreeable to this plan.  Patient's urinalysis has resulted largely within normal limits. Given the patient's mild lipase elevation this could be a very mild pancreatitis. I discussed discharge with a short course of tramadol and the patient is to stick with a clear liquid diet for the next 48 hours and then a low-fat diet for the next 7 days. Patient agreeable with this plan. I discussed return precautions.  ____________________________________________   FINAL CLINICAL IMPRESSION(S) / ED DIAGNOSES  Epigastric pain Pancreatitis    Minna AntisPaduchowski, Sendy Pluta, MD 06/09/17  2240  

## 2017-06-09 NOTE — ED Notes (Signed)
NAD noted at time of D/C. Pt denies questions or concerns. Pt ambulatory to the lobby at this time to wait for her son to pick her up.

## 2017-06-09 NOTE — ED Triage Notes (Addendum)
Pt presents to ED c/o upper abdominal pain that radiates to her back x 2 days. Describes pain as sharp, achy pain that is constant.Pt has sipped on water , taken her protonix and tylenol without relief. Pain worsens when she is moving around a lot but feels relief by lying on her side.

## 2017-08-25 ENCOUNTER — Emergency Department: Payer: Medicaid Other

## 2017-08-25 ENCOUNTER — Encounter: Payer: Self-pay | Admitting: Emergency Medicine

## 2017-08-25 ENCOUNTER — Emergency Department
Admission: EM | Admit: 2017-08-25 | Discharge: 2017-08-25 | Disposition: A | Discharge status: Home / Self Care | Payer: Medicaid Other | Attending: Emergency Medicine | Admitting: Emergency Medicine

## 2017-08-25 DIAGNOSIS — J45909 Unspecified asthma, uncomplicated: Secondary | ICD-10-CM | POA: Diagnosis not present

## 2017-08-25 DIAGNOSIS — K861 Other chronic pancreatitis: Secondary | ICD-10-CM | POA: Diagnosis not present

## 2017-08-25 DIAGNOSIS — Z79899 Other long term (current) drug therapy: Secondary | ICD-10-CM | POA: Diagnosis not present

## 2017-08-25 DIAGNOSIS — F17210 Nicotine dependence, cigarettes, uncomplicated: Secondary | ICD-10-CM | POA: Insufficient documentation

## 2017-08-25 DIAGNOSIS — N10 Acute tubulo-interstitial nephritis: Secondary | ICD-10-CM | POA: Diagnosis not present

## 2017-08-25 DIAGNOSIS — R109 Unspecified abdominal pain: Secondary | ICD-10-CM | POA: Diagnosis present

## 2017-08-25 DIAGNOSIS — R110 Nausea: Secondary | ICD-10-CM | POA: Diagnosis not present

## 2017-08-25 DIAGNOSIS — N12 Tubulo-interstitial nephritis, not specified as acute or chronic: Secondary | ICD-10-CM

## 2017-08-25 LAB — COMPREHENSIVE METABOLIC PANEL
ALK PHOS: 89 U/L (ref 38–126)
ALT: 47 U/L (ref 14–54)
ANION GAP: 9 (ref 5–15)
AST: 58 U/L — ABNORMAL HIGH (ref 15–41)
Albumin: 4.3 g/dL (ref 3.5–5.0)
BILIRUBIN TOTAL: 1.2 mg/dL (ref 0.3–1.2)
BUN: 16 mg/dL (ref 6–20)
CALCIUM: 9.4 mg/dL (ref 8.9–10.3)
CO2: 27 mmol/L (ref 22–32)
Chloride: 98 mmol/L — ABNORMAL LOW (ref 101–111)
Creatinine, Ser: 0.7 mg/dL (ref 0.44–1.00)
GFR calc Af Amer: >60 mL/min (ref >60)
Glucose, Bld: 114 mg/dL — ABNORMAL HIGH (ref 65–99)
POTASSIUM: 4 mmol/L (ref 3.5–5.1)
Sodium: 134 mmol/L — ABNORMAL LOW (ref 135–145)
TOTAL PROTEIN: 7.7 g/dL (ref 6.5–8.1)

## 2017-08-25 LAB — CBC WITH DIFFERENTIAL/PLATELET
BASOS ABS: 0 10*3/uL (ref 0–0.1)
BASOS PCT: 0 %
Eosinophils Absolute: 0 10*3/uL (ref 0–0.7)
Eosinophils Relative: 0 %
HEMATOCRIT: 41.5 % (ref 35.0–47.0)
HEMOGLOBIN: 14.3 g/dL (ref 12.0–16.0)
Lymphocytes Relative: 9 %
Lymphs Abs: 0.8 10*3/uL — ABNORMAL LOW (ref 1.0–3.6)
MCH: 38 pg — ABNORMAL HIGH (ref 26.0–34.0)
MCHC: 34.4 g/dL (ref 32.0–36.0)
MCV: 110.2 fL — ABNORMAL HIGH (ref 80.0–100.0)
MONOS PCT: 6 %
Monocytes Absolute: 0.5 10*3/uL (ref 0.2–0.9)
NEUTROS ABS: 7.4 10*3/uL — AB (ref 1.4–6.5)
NEUTROS PCT: 85 %
Platelets: 179 10*3/uL (ref 150–440)
RBC: 3.77 MIL/uL — ABNORMAL LOW (ref 3.80–5.20)
RDW: 16.1 % — ABNORMAL HIGH (ref 11.5–14.5)
WBC: 8.7 10*3/uL (ref 3.6–11.0)

## 2017-08-25 LAB — URINALYSIS, COMPLETE (UACMP) WITH MICROSCOPIC
Bacteria, UA: NONE SEEN
GLUCOSE, UA: NEGATIVE mg/dL
HGB URINE DIPSTICK: NEGATIVE
KETONES UR: 20 mg/dL — AB
LEUKOCYTES UA: NEGATIVE
NITRITE: NEGATIVE
PH: 5 (ref 5.0–8.0)
PROTEIN: 100 mg/dL — AB
Specific Gravity, Urine: 1.029 (ref 1.005–1.030)

## 2017-08-25 LAB — LIPASE, BLOOD: LIPASE: 262 U/L — AB (ref 11–51)

## 2017-08-25 LAB — POCT PREGNANCY, URINE: Preg Test, Ur: NEGATIVE

## 2017-08-25 MED ORDER — MORPHINE SULFATE (PF) 4 MG/ML IV SOLN
4.0000 mg | Freq: Once | INTRAVENOUS | Status: AC
  Administered 2017-08-25: 4 mg via INTRAVENOUS
  Filled 2017-08-25: qty 1

## 2017-08-25 MED ORDER — LEVOTHYROXINE SODIUM 100 MCG PO TABS: 100.0000 ug | ORAL_TABLET | Freq: Every day | ORAL | 1 refills | Status: AC

## 2017-08-25 MED ORDER — CEPHALEXIN 500 MG PO CAPS: 500.0000 mg | ORAL_CAPSULE | Freq: Two times a day (BID) | ORAL | 0 refills | Status: AC

## 2017-08-25 MED ORDER — ONDANSETRON HCL 4 MG/2ML IJ SOLN
INTRAMUSCULAR | Status: AC
  Filled 2017-08-25: qty 2

## 2017-08-25 MED ORDER — SODIUM CHLORIDE 0.9 % IV BOLUS (SEPSIS)
1000.0000 mL | Freq: Once | INTRAVENOUS | Status: AC
  Administered 2017-08-25: 1000 mL via INTRAVENOUS

## 2017-08-25 MED ORDER — ONDANSETRON HCL 4 MG/2ML IJ SOLN
4.0000 mg | Freq: Once | INTRAMUSCULAR | Status: AC
  Administered 2017-08-25: 4 mg via INTRAVENOUS

## 2017-08-25 MED ORDER — OXYCODONE-ACETAMINOPHEN 5-325 MG PO TABS
1.0000 | ORAL_TABLET | Freq: Once | ORAL | Status: AC
  Administered 2017-08-25: 1 via ORAL
  Filled 2017-08-25: qty 1

## 2017-08-25 MED ORDER — CEFTRIAXONE SODIUM IN DEXTROSE 20 MG/ML IV SOLN
1.0000 g | Freq: Once | INTRAVENOUS | Status: AC
  Administered 2017-08-25: 1 g via INTRAVENOUS
  Filled 2017-08-25: qty 50

## 2017-08-25 MED ORDER — MORPHINE SULFATE (PF) 4 MG/ML IV SOLN
4.0000 mg | Freq: Once | INTRAVENOUS | Status: AC
  Administered 2017-08-25: 4 mg via INTRAVENOUS

## 2017-08-25 MED ORDER — MORPHINE SULFATE (PF) 4 MG/ML IV SOLN
INTRAVENOUS | Status: AC
  Filled 2017-08-25: qty 1

## 2017-08-25 MED ORDER — OXYCODONE-ACETAMINOPHEN 5-325 MG PO TABS: 1.0000 | ORAL_TABLET | Freq: Four times a day (QID) | ORAL | 0 refills | Status: DC | PRN

## 2017-08-25 NOTE — ED Notes (Addendum)
NAD. Waiting on MD exam. No needs at this time.

## 2017-08-25 NOTE — ED Triage Notes (Signed)
Patient with complaint of left flank pain that started yesterday morning. Patient states that the pain has become worse and that now she is feeling nauseated. Patient denies urinary symptoms.

## 2017-08-25 NOTE — Discharge Instructions (Signed)
Take the antibiotic and finish the full course. Return to ER for new or worsening flank or abdominal pain, fevers, vomiting or inability to tolerate anything by mouth, weakness, blood in the urine, or any other new or worsening symptoms that concern you.  You should also take clear liquids and a light diet until your symptoms improve.   The CT showed possible cysts or other lesions in the liver - you should follow up with a primary care and have an MRI or other relevant testing of the liver in the next month.

## 2017-08-25 NOTE — ED Provider Notes (Signed)
[facility] Emergency Department Provider Note ____________________________________________   First MD Initiated Contact with Patient 08/25/17 [phone]     (approximate)  I have reviewed the triage vital signs and the nursing notes.   HISTORY  Chief Complaint Flank Pain and Nausea    HPI Terri Mann is a 41 y.o. female  with history of pancreatitis, chronic back pain, and asthma, who presents with left flank pain, acute onset yesterday, initially intermittent but now constant, worsening, and radiating to the left lower abdomen. Patient reports associated nausea but no vomiting or diarrhea. She denies any associated urinary symptoms. She does report subjective chills but no fever. She states that this feels very different than the pain she has had in the past with her pancreatitis and she has never had this pain before.   Past Medical History:  Diagnosis Date  . Asthma   . Chronic back pain   . Pancreatitis   . Thyroid disease     Patient Active Problem List   Diagnosis Date Noted  . Acute pancreatitis 08/11/2016  . Pancreatitis 08/10/2016    Past Surgical History:  Procedure Laterality Date  . tubial ligation       Prior to Admission medications   Medication Sig Start Date End Date Taking? Authorizing Provider  cetirizine (ZYRTEC) 10 MG tablet Take 10 mg by mouth daily.   Yes [provider]  IRON PO Take 1 tablet by mouth daily. 11/18/16  Yes [provider]  levothyroxine (SYNTHROID, LEVOTHROID) 100 MCG tablet Take 1 tablet (100 mcg total) by mouth daily before breakfast. 04/16/17  Yes Darci Current, MD  pantoprazole (PROTONIX) 40 MG tablet Take 1 tablet (40 mg total) by mouth daily. 12/30/16 12/30/17 Yes Willy Eddy, MD  bisacodyl (DULCOLAX) 5 MG EC tablet Take 1 tablet (5 mg total) by mouth daily as needed for moderate constipation. Patient not taking: Reported on 08/25/2017 08/13/16   Altamese Dilling, MD    dicyclomine (BENTYL) 20 MG tablet Take 1 tablet (20 mg total) by mouth 3 (three) times daily as needed for spasms. Patient not taking: Reported on 08/25/2017 01/01/17 01/01/18  Myrna Blazer, MD  HYDROcodone-acetaminophen (NORCO) 5-325 MG tablet Take 1 tablet by mouth every 4 (four) hours as needed for moderate pain. Patient not taking: Reported on 08/25/2017 01/01/17   Myrna Blazer, MD  HYDROcodone-acetaminophen [facility]) 5-325 MG tablet Take 2 tablets by mouth every 6 (six) hours as needed for moderate pain. Patient not taking: Reported on 08/25/2017 01/01/17   Myrna Blazer, MD  ondansetron (ZOFRAN ODT) 4 MG disintegrating tablet Take 1 tablet (4 mg total) by mouth every 8 (eight) hours as needed for nausea or vomiting. Patient not taking: Reported on 08/25/2017 10/29/16   Sharman Cheek, MD  ondansetron (ZOFRAN ODT) 4 MG disintegrating tablet Take 1 tablet (4 mg total) by mouth every 8 (eight) hours as needed for nausea or vomiting. 11/12/16   Emily Filbert, MD  ondansetron (ZOFRAN ODT) 4 MG disintegrating tablet Take 1 tablet (4 mg total) by mouth every 8 (eight) hours as needed for nausea or vomiting. Patient not taking: Reported on 08/25/2017 04/16/17   Darci Current, MD  ondansetron (ZOFRAN) 4 MG tablet Take 1 tablet (4 mg total) by mouth daily as needed for nausea or vomiting. Patient not taking: Reported on 08/25/2017 03/26/17   Jeanmarie Plant, MD  oxyCODONE-acetaminophen (PERCOCET) 10-325 MG tablet Take 1 tablet by mouth every 6 (six) hours as needed  for pain. Patient not taking: Reported on 08/25/2017 03/26/17 03/26/18  Jeanmarie Plant, MD  oxyCODONE-acetaminophen (ROXICET) 5-325 MG tablet Take 1 tablet by mouth every 4 (four) hours as needed for severe pain. Patient not taking: Reported on 08/25/2017 04/16/17   Darci Current, MD  pantoprazole (PROTONIX) 40 MG tablet Take 1 tablet (40 mg total) by mouth 2 (two) times daily before a meal. Patient  not taking: Reported on 08/25/2017 08/13/16   Altamese Dilling, MD  promethazine (PHENERGAN) 12.5 MG tablet Take 1 tablet (12.5 mg total) by mouth every 6 (six) hours as needed for nausea or vomiting. Patient not taking: Reported on 08/25/2017 12/30/16   Willy Eddy, MD  sucralfate (CARAFATE) 1 g tablet Take 1 tablet (1 g total) by mouth 4 (four) times daily -  with meals and at bedtime. Patient not taking: Reported on 08/25/2017 08/13/16   Altamese Dilling, MD  traMADol (ULTRAM) 50 MG tablet Take 1 tablet (50 mg total) by mouth every 6 (six) hours as needed. Patient not taking: Reported on 08/25/2017 06/09/17   Minna Antis, MD    Allergies Patient has no known allergies.  No family history on file.  Social History Social History  Substance Use Topics  . Smoking status: Current Some Day Smoker    Packs/day: 2.00    Types: Cigarettes  . Smokeless tobacco: Never Used  . Alcohol use Yes     Comment: socially    Review of Systems  Constitutional: positive for chills Eyes: no redness ENT: no neck pain Cardiovascular: Denies chest pain. Respiratory: Denies shortness of breath. Gastrointestinal: positive for nausea.  Genitourinary: Negative for dysuria or hematuria. Musculoskeletal: positive for back pain. Skin: Negative for rash. Neurological: Negative for headache.   ____________________________________________   PHYSICAL EXAM:  VITAL SIGNS: ED Triage Vitals [08/25/17 0652]  Enc Vitals Group     BP 135/87     Pulse Rate (!) 131     Resp 18     Temp 97.9 F (36.6 C)     Temp src      SpO2 97 %     Weight 168 lb (76.2 kg)     Height  (1.727 m)     Head Circumference      Peak Flow      Pain Score 10     Pain Loc      Pain Edu?      Excl. in GC?     Constitutional: Alert and oriented. Uncomfortable appearing. Eyes: Conjunctivae are normal.  Head: Atraumatic. Nose: No congestion/rhinnorhea. Mouth/Throat: Mucous membranes are moist.    Neck: Normal range of motion.  Cardiovascular: Good peripheral circulation. Respiratory: Normal respiratory effort.  No retractions. Lungs CTAB. Gastrointestinal: Soft with minimal LLQ tenderness extending into L flank.  Genitourinary: L CVA tenderness. Musculoskeletal:   Extremities warm and well perfused.  Neurologic:  Normal speech and language. No gross focal neurologic deficits are appreciated.  Skin:  Skin is warm and dry. No rash noted. Psychiatric: Mood and affect are normal. Speech and behavior are normal.  ____________________________________________   LABS (all labs ordered are listed, but only abnormal results are displayed)  Labs Reviewed  URINALYSIS, COMPLETE (UACMP) WITH MICROSCOPIC - Abnormal; Notable for the following:       Result Value   Color, Urine AMBER (*)    APPearance TURBID (*)    Bilirubin Urine SMALL (*)    Ketones, ur 20 (*)    Protein, ur 100 (*)  Squamous Epithelial / LPF 6-30 (*)    All other components within normal limits  COMPREHENSIVE METABOLIC PANEL - Abnormal; Notable for the following:    Sodium 134 (*)    Chloride 98 (*)    Glucose, Bld 114 (*)    AST 58 (*)    All other components within normal limits  LIPASE, BLOOD - Abnormal; Notable for the following:    Lipase 262 (*)    All other components within normal limits  CBC WITH DIFFERENTIAL/PLATELET - Abnormal; Notable for the following:    RBC 3.77 (*)    MCV 110.2 (*)    MCH 38.0 (*)    RDW 16.1 (*)    Neutro Abs 7.4 (*)    Lymphs Abs 0.8 (*)    All other components within normal limits  POC URINE PREG, ED  POCT PREGNANCY, URINE   ____________________________________________  EKG   ____________________________________________  RADIOLOGY  CT abd shows findings consistent with pancreatitis without fluid collection, and liver densities not seen on prior CT.   ____________________________________________   PROCEDURES  Procedure(s) performed: No    Critical Care  performed: No ____________________________________________   INITIAL IMPRESSION / ASSESSMENT AND PLAN / ED COURSE  Pertinent labs & imaging results that were available during my care of the patient were reviewed by me and considered in my medical decision making (see chart for details).  41 year old female with history of pancreatitis and multiple prior ED visits for related abdominal pain presents with left flank pain with associated nausea since yesterday, and no prior history of this specific symptom. Past history reviewed and is noncontributory.   Patient is uncomfortable appearing, vital signs are normal, and the exam is as described with left flank and left CVA tenderness.  UA shows WBCs and RBCs but no leuks or nitrites.  Differential includes most likely ureteral stone versus pyelonephritis, diverticulitis or other GI cause, or less likely a muscular pain. Given the location of the pain and no suprapubic/pelvic tenderness, there is no evidence of  gynecologic etiology. Plan: basic labs, fluids, analgesia, and CT abdomen to evaluate for possible stone.    ----------------------------------------- 12:10 PM on 08/25/2017 -----------------------------------------  CT shows findings consistent with pancreatitis, but no evidence of ureteral stone or other acute findings.  she had some recurrent pain in the emergency department but it is now well controlled with by mouth medication. Given the lack of a ureteral stone and the UA findings, the most likely cause is pyelonephritis. Lipase is minimally elevated and patient clinically has no evidence of acute pancreatitis; she has no active vomiting, she is tolerating PO liquids, and has no epigastric pain or tenderness. Other lab workup is unremarkable. She now feels well to go home. Given that patient is tolerating PO, has normal vital signs, no evidence of sepsis, and feels well, there is no indication for admission for pyelo.  first dose of antibiotic  given in ED. Will discharge with antibiotics and analgesic. Patient given thorough return precautions. She also asked for prescription for her thyroid medication given that she had a problem with her insurance and is not able to follow up with her PMD right away.  She was informed about the CT findings in the liver.   ____________________________________________   FINAL CLINICAL IMPRESSION(S) / ED DIAGNOSES  Final diagnoses:  Pyelonephritis  Chronic pancreatitis, unspecified pancreatitis type (HCC)      NEW MEDICATIONS STARTED DURING THIS VISIT:  New Prescriptions   No medications on file  Note:  This document was prepared using Dragon voice recognition software and may include unintentional dictation errors.    Dionne Bucy, MD 08/25/17 1213

## 2017-08-26 ENCOUNTER — Emergency Department
Admission: EM | Admit: 2017-08-26 | Discharge: 2017-08-27 | Disposition: A | Discharge status: Home / Self Care | Source: Intra-hospital

## 2017-08-27 MED ORDER — CEFDINIR 300 MG CAPSULE
ORAL_CAPSULE | Freq: Every day | ORAL | 0 refills | 0.00000 days | Status: CP
Start: 2017-08-27 — End: 2017-09-06

## 2017-08-27 MED ORDER — LIDOCAINE 4 %-MENTHOL 1 % TOPICAL PATCH
MEDICATED_PATCH | TOPICAL | 0 refills | 0.00000 days | Status: CP
Start: 2017-08-27 — End: 2019-07-09

## 2017-09-15 ENCOUNTER — Emergency Department
Admission: EM | Admit: 2017-09-15 | Discharge: 2017-09-15 | Discharge status: Absent without leave | Payer: Medicaid Other

## 2017-09-17 ENCOUNTER — Emergency Department
Admission: EM | Admit: 2017-09-17 | Discharge: 2017-09-17 | Disposition: A | Discharge status: Home / Self Care | Payer: MEDICAID | Source: Intra-hospital | Attending: Emergency Medicine | Admitting: Emergency Medicine

## 2017-09-17 MED ORDER — OXYCODONE-ACETAMINOPHEN 5 MG-325 MG TABLET
ORAL_TABLET | ORAL | 0 refills | 0 days | Status: CP | PRN
Start: 2017-09-17 — End: 2017-09-22

## 2017-12-26 ENCOUNTER — Other Ambulatory Visit: Payer: Self-pay | Admitting: Gastroenterology

## 2017-12-26 DIAGNOSIS — R1084 Generalized abdominal pain: Secondary | ICD-10-CM

## 2018-01-03 ENCOUNTER — Emergency Department: Payer: Medicaid Other

## 2018-01-03 ENCOUNTER — Encounter: Payer: Self-pay | Admitting: Emergency Medicine

## 2018-01-03 ENCOUNTER — Other Ambulatory Visit: Payer: Self-pay

## 2018-01-03 ENCOUNTER — Inpatient Hospital Stay
Admission: EM | Admit: 2018-01-03 | Discharge: 2018-01-04 | DRG: 101 | Discharge status: Against Medical Advice | Payer: Medicaid Other | Attending: Internal Medicine | Admitting: Internal Medicine

## 2018-01-03 DIAGNOSIS — F10930 Alcohol use, unspecified with withdrawal, uncomplicated: Secondary | ICD-10-CM | POA: Diagnosis present

## 2018-01-03 DIAGNOSIS — E039 Hypothyroidism, unspecified: Secondary | ICD-10-CM | POA: Diagnosis present

## 2018-01-03 DIAGNOSIS — F17210 Nicotine dependence, cigarettes, uncomplicated: Secondary | ICD-10-CM | POA: Diagnosis present

## 2018-01-03 DIAGNOSIS — F10230 Alcohol dependence with withdrawal, uncomplicated: Secondary | ICD-10-CM | POA: Diagnosis present

## 2018-01-03 DIAGNOSIS — F10239 Alcohol dependence with withdrawal, unspecified: Secondary | ICD-10-CM | POA: Diagnosis present

## 2018-01-03 DIAGNOSIS — K8689 Other specified diseases of pancreas: Secondary | ICD-10-CM | POA: Diagnosis present

## 2018-01-03 DIAGNOSIS — K219 Gastro-esophageal reflux disease without esophagitis: Secondary | ICD-10-CM | POA: Diagnosis present

## 2018-01-03 DIAGNOSIS — Z79899 Other long term (current) drug therapy: Secondary | ICD-10-CM

## 2018-01-03 DIAGNOSIS — F10939 Alcohol use, unspecified with withdrawal, unspecified: Secondary | ICD-10-CM

## 2018-01-03 DIAGNOSIS — J45909 Unspecified asthma, uncomplicated: Secondary | ICD-10-CM | POA: Diagnosis present

## 2018-01-03 DIAGNOSIS — K861 Other chronic pancreatitis: Secondary | ICD-10-CM | POA: Diagnosis present

## 2018-01-03 DIAGNOSIS — R569 Unspecified convulsions: Principal | ICD-10-CM | POA: Diagnosis present

## 2018-01-03 DIAGNOSIS — Z7952 Long term (current) use of systemic steroids: Secondary | ICD-10-CM

## 2018-01-03 HISTORY — DX: Gastro-esophageal reflux disease without esophagitis: K21.9

## 2018-01-03 LAB — COMPREHENSIVE METABOLIC PANEL
ALBUMIN: 4.1 g/dL (ref 3.5–5.0)
ALK PHOS: 98 U/L (ref 38–126)
ALT: 78 U/L — AB (ref 14–54)
AST: 155 U/L — ABNORMAL HIGH (ref 15–41)
Anion gap: 17 — ABNORMAL HIGH (ref 5–15)
BUN: 11 mg/dL (ref 6–20)
CALCIUM: 9.2 mg/dL (ref 8.9–10.3)
CO2: 20 mmol/L — ABNORMAL LOW (ref 22–32)
CREATININE: 0.81 mg/dL (ref 0.44–1.00)
Chloride: 97 mmol/L — ABNORMAL LOW (ref 101–111)
GFR calc Af Amer: >60 mL/min (ref >60)
GLUCOSE: 126 mg/dL — AB (ref 65–99)
Potassium: 3.4 mmol/L — ABNORMAL LOW (ref 3.5–5.1)
Sodium: 134 mmol/L — ABNORMAL LOW (ref 135–145)
TOTAL PROTEIN: 7.5 g/dL (ref 6.5–8.1)
Total Bilirubin: 1 mg/dL (ref 0.3–1.2)

## 2018-01-03 LAB — CBC WITH DIFFERENTIAL/PLATELET
BASOS ABS: 0 10*3/uL (ref 0–0.1)
Basophils Relative: 1 %
EOS ABS: 0.1 10*3/uL (ref 0–0.7)
EOS PCT: 1 %
HCT: 38.9 % (ref 35.0–47.0)
Hemoglobin: 13.3 g/dL (ref 12.0–16.0)
LYMPHS PCT: 17 %
Lymphs Abs: 1.5 10*3/uL (ref 1.0–3.6)
MCH: 37.8 pg — ABNORMAL HIGH (ref 26.0–34.0)
MCHC: 34.1 g/dL (ref 32.0–36.0)
MCV: 110.8 fL — ABNORMAL HIGH (ref 80.0–100.0)
MONO ABS: 0.7 10*3/uL (ref 0.2–0.9)
Monocytes Relative: 7 %
Neutro Abs: 7 10*3/uL — ABNORMAL HIGH (ref 1.4–6.5)
Neutrophils Relative %: 74 %
PLATELETS: 135 10*3/uL — AB (ref 150–440)
RBC: 3.51 MIL/uL — AB (ref 3.80–5.20)
RDW: 15.4 % — AB (ref 11.5–14.5)
WBC: 9.3 10*3/uL (ref 3.6–11.0)

## 2018-01-03 LAB — POCT PREGNANCY, URINE: PREG TEST UR: NEGATIVE

## 2018-01-03 LAB — ETHANOL: ALCOHOL ETHYL (B): 11 mg/dL — AB (ref <10)

## 2018-01-03 LAB — GLUCOSE, CAPILLARY: Glucose-Capillary: 129 mg/dL — ABNORMAL HIGH (ref 65–99)

## 2018-01-03 MED ORDER — LORAZEPAM 2 MG/ML IJ SOLN
0.0000 mg | Freq: Four times a day (QID) | INTRAMUSCULAR | Status: DC
  Administered 2018-01-04: 1 mg via INTRAVENOUS
  Filled 2018-01-03: qty 1

## 2018-01-03 MED ORDER — LORAZEPAM 2 MG PO TABS: 0.0000 mg | ORAL_TABLET | Freq: Two times a day (BID) | ORAL | Status: DC

## 2018-01-03 MED ORDER — LORAZEPAM 2 MG PO TABS: 0.0000 mg | ORAL_TABLET | Freq: Four times a day (QID) | ORAL | Status: DC

## 2018-01-03 MED ORDER — THIAMINE HCL 100 MG/ML IJ SOLN: 100.0000 mg | Freq: Every day | INTRAMUSCULAR | Status: DC

## 2018-01-03 MED ORDER — LORAZEPAM 2 MG/ML IJ SOLN
2.0000 mg | Freq: Once | INTRAMUSCULAR | Status: AC
  Administered 2018-01-03: 2 mg via INTRAVENOUS

## 2018-01-03 MED ORDER — LORAZEPAM 2 MG/ML IJ SOLN: 0.0000 mg | Freq: Two times a day (BID) | INTRAMUSCULAR | Status: DC

## 2018-01-03 MED ORDER — LORAZEPAM 2 MG/ML IJ SOLN
INTRAMUSCULAR | Status: AC
  Administered 2018-01-03: 2 mg via INTRAVENOUS
  Filled 2018-01-03: qty 1

## 2018-01-03 MED ORDER — SODIUM CHLORIDE 0.9 % IV BOLUS (SEPSIS)
1000.0000 mL | Freq: Once | INTRAVENOUS | Status: AC
  Administered 2018-01-04: 1000 mL via INTRAVENOUS

## 2018-01-03 MED ORDER — VITAMIN B-1 100 MG PO TABS: 100.0000 mg | ORAL_TABLET | Freq: Every day | ORAL | Status: DC

## 2018-01-03 MED ORDER — FOLIC ACID 1 MG PO TABS
1.0000 mg | ORAL_TABLET | Freq: Once | ORAL | Status: AC
  Administered 2018-01-04: 1 mg via ORAL
  Filled 2018-01-03: qty 1

## 2018-01-03 NOTE — ED Notes (Signed)
Pt to the ER for etoh withdrawal seizures. Pt needs to have an endoscopy next week. Pt was laying in bed. Pt reports pain to pancreas and has a seizure, rolled off bed, and seized for about 5 minutes. EMS was called. Pt had one drink today but normally has 15 to 20. Pt is attempting to stop drinking.

## 2018-01-03 NOTE — ED Triage Notes (Signed)
Pt arrives from home with witnessed seizure by significant other. Pt has noted bitten tongue and reports no hx of seizures. Pt has hx of alcohol induced pancreatitis but did not drink today. Pt is shaky at this time in triage.

## 2018-01-04 ENCOUNTER — Encounter: Payer: Self-pay | Admitting: Internal Medicine

## 2018-01-04 DIAGNOSIS — K861 Other chronic pancreatitis: Secondary | ICD-10-CM | POA: Diagnosis present

## 2018-01-04 DIAGNOSIS — R569 Unspecified convulsions: Secondary | ICD-10-CM | POA: Diagnosis not present

## 2018-01-04 DIAGNOSIS — E039 Hypothyroidism, unspecified: Secondary | ICD-10-CM | POA: Diagnosis present

## 2018-01-04 DIAGNOSIS — F10230 Alcohol dependence with withdrawal, uncomplicated: Secondary | ICD-10-CM | POA: Diagnosis present

## 2018-01-04 DIAGNOSIS — K219 Gastro-esophageal reflux disease without esophagitis: Secondary | ICD-10-CM | POA: Diagnosis present

## 2018-01-04 DIAGNOSIS — J45909 Unspecified asthma, uncomplicated: Secondary | ICD-10-CM | POA: Diagnosis present

## 2018-01-04 DIAGNOSIS — Z79899 Other long term (current) drug therapy: Secondary | ICD-10-CM | POA: Diagnosis not present

## 2018-01-04 DIAGNOSIS — F10239 Alcohol dependence with withdrawal, unspecified: Secondary | ICD-10-CM

## 2018-01-04 DIAGNOSIS — F17210 Nicotine dependence, cigarettes, uncomplicated: Secondary | ICD-10-CM | POA: Diagnosis present

## 2018-01-04 DIAGNOSIS — Z7952 Long term (current) use of systemic steroids: Secondary | ICD-10-CM | POA: Diagnosis not present

## 2018-01-04 LAB — CBC
HEMATOCRIT: 35.7 % (ref 35.0–47.0)
Hemoglobin: 12.1 g/dL (ref 12.0–16.0)
MCH: 37.4 pg — ABNORMAL HIGH (ref 26.0–34.0)
MCHC: 33.8 g/dL (ref 32.0–36.0)
MCV: 110.5 fL — ABNORMAL HIGH (ref 80.0–100.0)
Platelets: 104 10*3/uL — ABNORMAL LOW (ref 150–440)
RBC: 3.23 MIL/uL — ABNORMAL LOW (ref 3.80–5.20)
RDW: 15.6 % — ABNORMAL HIGH (ref 11.5–14.5)
WBC: 5.6 10*3/uL (ref 3.6–11.0)

## 2018-01-04 LAB — BASIC METABOLIC PANEL
ANION GAP: 8 (ref 5–15)
BUN: 12 mg/dL (ref 6–20)
CO2: 26 mmol/L (ref 22–32)
Calcium: 8.5 mg/dL — ABNORMAL LOW (ref 8.9–10.3)
Chloride: 102 mmol/L (ref 101–111)
Creatinine, Ser: 0.54 mg/dL (ref 0.44–1.00)
Glucose, Bld: 106 mg/dL — ABNORMAL HIGH (ref 65–99)
POTASSIUM: 3.5 mmol/L (ref 3.5–5.1)
Sodium: 136 mmol/L (ref 135–145)

## 2018-01-04 LAB — LIPASE, BLOOD: LIPASE: 104 U/L — AB (ref 11–51)

## 2018-01-04 MED ORDER — LEVOTHYROXINE SODIUM 100 MCG PO TABS
100.0000 ug | ORAL_TABLET | Freq: Every day | ORAL | Status: DC
  Administered 2018-01-04: 100 ug via ORAL
  Filled 2018-01-04: qty 1

## 2018-01-04 MED ORDER — LORAZEPAM 2 MG/ML IJ SOLN: 1.0000 mg | Freq: Four times a day (QID) | INTRAMUSCULAR | Status: DC | PRN

## 2018-01-04 MED ORDER — VITAMIN B-1 100 MG PO TABS: 100.0000 mg | ORAL_TABLET | Freq: Every day | ORAL | Status: DC

## 2018-01-04 MED ORDER — ENOXAPARIN SODIUM 40 MG/0.4ML ~~LOC~~ SOLN: 40.0000 mg | SUBCUTANEOUS | Status: DC

## 2018-01-04 MED ORDER — ADULT MULTIVITAMIN W/MINERALS CH
1.0000 | ORAL_TABLET | Freq: Every day | ORAL | Status: DC
  Filled 2018-01-04: qty 1

## 2018-01-04 MED ORDER — ONDANSETRON HCL 4 MG/2ML IJ SOLN: 4.0000 mg | Freq: Four times a day (QID) | INTRAMUSCULAR | Status: DC | PRN

## 2018-01-04 MED ORDER — DULOXETINE HCL 60 MG PO CPEP
60.0000 mg | ORAL_CAPSULE | Freq: Every day | ORAL | Status: DC
  Filled 2018-01-04: qty 1

## 2018-01-04 MED ORDER — NICOTINE 21 MG/24HR TD PT24
21.0000 mg | MEDICATED_PATCH | Freq: Every day | TRANSDERMAL | Status: DC
Start: 2018-01-04 — End: 2018-01-04
  Administered 2018-01-04: 21 mg via TRANSDERMAL
  Filled 2018-01-04: qty 1

## 2018-01-04 MED ORDER — ACETAMINOPHEN 325 MG PO TABS: 650.0000 mg | ORAL_TABLET | Freq: Four times a day (QID) | ORAL | Status: DC | PRN

## 2018-01-04 MED ORDER — SUCRALFATE 1 G PO TABS
1.0000 g | ORAL_TABLET | Freq: Three times a day (TID) | ORAL | Status: DC
  Filled 2018-01-04: qty 1

## 2018-01-04 MED ORDER — PANCRELIPASE (LIP-PROT-AMYL) 12000-38000 UNITS PO CPEP
36000.0000 [IU] | ORAL_CAPSULE | Freq: Two times a day (BID) | ORAL | Status: DC
  Administered 2018-01-04: 36000 [IU] via ORAL
  Filled 2018-01-04: qty 1
  Filled 2018-01-04: qty 3

## 2018-01-04 MED ORDER — UMECLIDINIUM BROMIDE 62.5 MCG/INH IN AEPB
1.0000 | INHALATION_SPRAY | Freq: Every day | RESPIRATORY_TRACT | Status: DC
  Administered 2018-01-04: 1 via RESPIRATORY_TRACT
  Filled 2018-01-04: qty 7

## 2018-01-04 MED ORDER — ALBUTEROL SULFATE (2.5 MG/3ML) 0.083% IN NEBU: 3.0000 mL | INHALATION_SOLUTION | Freq: Four times a day (QID) | RESPIRATORY_TRACT | Status: DC | PRN

## 2018-01-04 MED ORDER — BUDESONIDE 0.25 MG/2ML IN SUSP
0.2500 mg | Freq: Two times a day (BID) | RESPIRATORY_TRACT | Status: DC
  Administered 2018-01-04: 0.25 mg via RESPIRATORY_TRACT
  Filled 2018-01-04: qty 2

## 2018-01-04 MED ORDER — PANCRELIPASE (LIP-PROT-AMYL) 12000-38000 UNITS PO CPEP
12000.0000 [IU] | ORAL_CAPSULE | Freq: Every day | ORAL | Status: DC
  Filled 2018-01-04: qty 1

## 2018-01-04 MED ORDER — LORAZEPAM 2 MG/ML IJ SOLN: 2.0000 mg | INTRAMUSCULAR | Status: DC | PRN

## 2018-01-04 MED ORDER — LORAZEPAM 1 MG PO TABS: 1.0000 mg | ORAL_TABLET | Freq: Four times a day (QID) | ORAL | Status: DC | PRN

## 2018-01-04 MED ORDER — PANTOPRAZOLE SODIUM 40 MG PO TBEC
40.0000 mg | DELAYED_RELEASE_TABLET | Freq: Two times a day (BID) | ORAL | Status: DC
  Administered 2018-01-04: 40 mg via ORAL
  Filled 2018-01-04: qty 1

## 2018-01-04 MED ORDER — FOLIC ACID 1 MG PO TABS
1.0000 mg | ORAL_TABLET | Freq: Every day | ORAL | Status: DC
  Administered 2018-01-04: 1 mg via ORAL
  Filled 2018-01-04: qty 1

## 2018-01-04 MED ORDER — LORAZEPAM 2 MG/ML IJ SOLN
0.0000 mg | Freq: Four times a day (QID) | INTRAMUSCULAR | Status: DC
  Administered 2018-01-04: 1 mg via INTRAVENOUS
  Filled 2018-01-04: qty 1

## 2018-01-04 MED ORDER — ONDANSETRON HCL 4 MG PO TABS: 4.0000 mg | ORAL_TABLET | Freq: Four times a day (QID) | ORAL | Status: DC | PRN

## 2018-01-04 MED ORDER — MAGIC MOUTHWASH
5.0000 mL | Freq: Three times a day (TID) | ORAL | Status: DC | PRN
  Administered 2018-01-04: 5 mL via ORAL
  Filled 2018-01-04: qty 10
  Filled 2018-01-04: qty 5

## 2018-01-04 MED ORDER — LIDOCAINE VISCOUS 2 % MT SOLN
15.0000 mL | Freq: Three times a day (TID) | OROMUCOSAL | Status: DC | PRN
  Administered 2018-01-04: 15 mL via OROMUCOSAL
  Filled 2018-01-04 (×2): qty 15

## 2018-01-04 MED ORDER — THIAMINE HCL 100 MG/ML IJ SOLN
100.0000 mg | Freq: Every day | INTRAMUSCULAR | Status: DC
  Administered 2018-01-04: 100 mg via INTRAVENOUS
  Filled 2018-01-04: qty 2

## 2018-01-04 MED ORDER — MAGIC MOUTHWASH W/LIDOCAINE: 5.0000 mL | Freq: Three times a day (TID) | ORAL | Status: DC | PRN

## 2018-01-04 MED ORDER — LORAZEPAM 2 MG/ML IJ SOLN: 0.0000 mg | Freq: Two times a day (BID) | INTRAMUSCULAR | Status: DC

## 2018-01-04 MED ORDER — ACETAMINOPHEN 650 MG RE SUPP: 650.0000 mg | Freq: Four times a day (QID) | RECTAL | Status: DC | PRN

## 2018-01-04 MED ORDER — FLUTICASONE PROPIONATE HFA 220 MCG/ACT IN AERO: 1.0000 | INHALATION_SPRAY | Freq: Two times a day (BID) | RESPIRATORY_TRACT | Status: DC

## 2018-01-04 MED ORDER — ARFORMOTEROL TARTRATE 15 MCG/2ML IN NEBU
15.0000 ug | INHALATION_SOLUTION | Freq: Two times a day (BID) | RESPIRATORY_TRACT | Status: DC
  Administered 2018-01-04: 15 ug via RESPIRATORY_TRACT
  Filled 2018-01-04 (×2): qty 2

## 2018-01-04 NOTE — Progress Notes (Addendum)
Pt asking to go home. Last CIWA score was a 9. Dr. Amado CoeGouru stated that she can not safely discharge the pt due to the pt's current medical condition. The pt was informed of this and is still wanting to leave the hospital. She has chosen to leave against medical advice and has been notified that there are risks related to her leaving the hospital including deterioration in her condition and even possible death. Pt states she is aware of the risks and denies suicidal ideation. Pt's son is at bedside and states that he will drive pt home as she has received IV lorazepam during her stay. IV has been removed, Dr. Amado CoeGouru has been notified, and AMA paperwork has been signed.

## 2018-01-04 NOTE — ED Provider Notes (Signed)
[facility] Emergency Department Provider Note   ____________________________________________   First MD Initiated Contact with Patient 01/03/18 2308     (approximate)  I have reviewed the triage vital signs and the nursing notes.   HISTORY  Chief Complaint Seizures    HPI Terri Mann is a 42 y.o. female who comes into the hospital today with a seizure.  The patient states that she bit her tongue.  The patient's visitor states that he was there for about an hour.  They were watching television when the patient started having the seizure.  She has a history of chronic pancreatitis and is getting an endoscopy on Monday.  The patient stated that her upper abdomen and sides were hurting today and her hands are more shaky than normal.  The visitor states that suddenly the patient's hands were numb but she started jerking and having a seizure.  It seemed to last 15 minutes and the patient did bite her tongue.  The patient did roll off of the bed and seemed to be frothing at the mouth.  She is never had seizures before.  EMS was called but the patient refused to come with them.  The patient states that her last drink was this morning.  She had a half a drink which is much less than what she typically drinks.  The patient states that she drinks 15-20 whiskey and waters daily.  The patient states that she is never tried to cut back so she is never had any withdrawal seizures or gone through withdrawals before.  The patient is here today for evaluation.  Past Medical History:  Diagnosis Date  . Asthma   . Chronic back pain   . GERD (gastroesophageal reflux disease)   . Pancreatitis   . Thyroid disease     Patient Active Problem List   Diagnosis Date Noted  . Asthma 01/04/2018  . GERD (gastroesophageal reflux disease) 01/04/2018  . Hypothyroidism 01/04/2018  . Alcohol withdrawal seizure without complication (HCC) 01/04/2018  . Alcohol withdrawal seizure (HCC)  01/04/2018  . Acute pancreatitis 08/11/2016  . Pancreatic insufficiency 08/10/2016    Past Surgical History:  Procedure Laterality Date  . tubial ligation       Prior to Admission medications   Medication Sig Start Date End Date Taking? Authorizing Provider  cyclobenzaprine (FLEXERIL) 10 MG tablet Take 10 mg by mouth 3 (three) times daily as needed for muscle spasms.   Yes [provider]  DULoxetine (CYMBALTA) 60 MG capsule Take 60 mg by mouth daily.   Yes [provider]  FLOVENT HFA 220 MCG/ACT inhaler Inhale 1 puff into the lungs 2 (two) times daily.   Yes [provider]  levothyroxine (SYNTHROID) 100 MCG tablet Take 1 tablet (100 mcg total) by mouth daily. 08/25/17 01/03/19 Yes Dionne Bucy, MD  lipase/protease/amylase (CREON) 12000 units CPEP capsule Take 36,000 Units by mouth 2 (two) times daily with a meal.   Yes [provider]  lipase/protease/amylase (CREON) 12000 units CPEP capsule Take 12,000 Units by mouth daily. With snacks   Yes [provider]  loratadine (CLARITIN) 10 MG tablet Take 10 mg by mouth daily as needed for allergies.   Yes [provider]  meloxicam (MOBIC) 7.5 MG tablet Take 7.5 mg by mouth 2 (two) times daily.   Yes [provider]  PROAIR HFA 108 (90 Base) MCG/ACT inhaler Inhale 1-2 puffs into the lungs every 6 (six) hours as needed for shortness of breath.  Yes [provider]  STIOLTO RESPIMAT 2.5-2.5 MCG/ACT AERS Inhale 2 puffs into the lungs daily.   Yes [provider]  sucralfate (CARAFATE) 1 g tablet Take 1 tablet (1 g total) by mouth 4 (four) times daily -  with meals and at bedtime. 08/13/16  Yes Altamese DillingVachhani, Vaibhavkumar, MD  Vitamin D, Ergocalciferol, (DRISDOL) 50000 units CAPS capsule Take 50,000 Units by mouth every 7 (seven) days. Tuesdays   Yes [provider]  dicyclomine (BENTYL) 20 MG tablet Take 1 tablet (20 mg total) by mouth 3 (three) times daily  as needed for spasms. Patient not taking: Reported on 08/25/2017 01/01/17 01/01/18  Myrna BlazerSchaevitz, David Matthew, MD  pantoprazole (PROTONIX) 40 MG tablet Take 1 tablet (40 mg total) by mouth daily. Patient taking differently: Take 40 mg by mouth 2 (two) times daily.  12/30/16 12/30/17  Willy Eddyobinson, Patrick, MD    Allergies Patient has no known allergies.  Family History  Problem Relation Age of Onset  . Thyroid disease Mother   . Heart attack Father   . Thyroid disease Sister   . Liver disease Brother     Social History Social History   Tobacco Use  . Smoking status: Current Some Day Smoker    Packs/day: 2.00    Types: Cigarettes  . Smokeless tobacco: Never Used  Substance Use Topics  . Alcohol use: Yes    Alcohol/week: 84.0 oz    Types: 140 Shots of liquor per week  . Drug use: No    Review of Systems  Constitutional: No fever/chills Eyes: No visual changes. ENT: No sore throat. Cardiovascular: Denies chest pain. Respiratory: Denies shortness of breath. Gastrointestinal:  abdominal pain.  No nausea, no vomiting.  No diarrhea.  No constipation. Genitourinary: Negative for dysuria. Musculoskeletal: Negative for back pain. Skin: Negative for rash. Neurological: seizure   ____________________________________________   PHYSICAL EXAM:  VITAL SIGNS: ED Triage Vitals  Enc Vitals Group     BP 01/03/18 2226 110/62     Pulse Rate 01/03/18 2226 90     Resp 01/03/18 2226 18     Temp 01/03/18 2226 97.7 F (36.5 C)     Temp src --      SpO2 01/03/18 2226 97 %     Weight 01/03/18 2227 160 lb (72.6 kg)     Height 01/03/18 2227 5\' 7"  (1.702 m)     Head Circumference --      Peak Flow --      Pain Score 01/03/18 2227 7     Pain Loc --      Pain Edu? --      Excl. in GC? --     Constitutional: Alert and oriented. Well appearing and in moderate distress. Eyes: Conjunctivae are normal. PERRL. EOMI. Head: Atraumatic. Nose: No congestion/rhinnorhea. Mouth/Throat: Mucous  membranes are moist.  Oropharynx non-erythematous. Noticeable bite to left side of tongue, no active bleeding, wound approximates but can be pulled open Neck: No cervical spine tenderness to palpation. Cardiovascular: Tachycardia, regular rhythm. Grossly normal heart sounds.  Good peripheral circulation. Respiratory: Normal respiratory effort.  No retractions. Lungs CTAB. Gastrointestinal: Soft and nontender. No distention. Positive bowel sounds Musculoskeletal: No lower extremity tenderness nor edema.  Neurologic:  Normal speech and language.appreciated. No gait instability. Skin:  Skin is warm, dry and intact.  Psychiatric: Mood and affect are normal.   ____________________________________________   LABS (all labs ordered are listed, but only abnormal results are displayed)  Labs Reviewed  COMPREHENSIVE METABOLIC PANEL - Abnormal;  Notable for the following components:      Result Value   Sodium 134 (*)    Potassium 3.4 (*)    Chloride 97 (*)    CO2 20 (*)    Glucose, Bld 126 (*)    AST 155 (*)    ALT 78 (*)    Anion gap 17 (*)    All other components within normal limits  CBC WITH DIFFERENTIAL/PLATELET - Abnormal; Notable for the following components:   RBC 3.51 (*)    MCV 110.8 (*)    MCH 37.8 (*)    RDW 15.4 (*)    Platelets 135 (*)    Neutro Abs 7.0 (*)    All other components within normal limits  ETHANOL - Abnormal; Notable for the following components:   Alcohol, Ethyl (B) 11 (*)    All other components within normal limits  GLUCOSE, CAPILLARY - Abnormal; Notable for the following components:   Glucose-Capillary 129 (*)    All other components within normal limits  LIPASE, BLOOD - Abnormal; Notable for the following components:   Lipase 104 (*)    All other components within normal limits  CBG MONITORING, ED  POC URINE PREG, ED  POCT PREGNANCY, URINE   ____________________________________________  EKG  ED ECG REPORT I, Rebecka Apley, the attending  physician, personally viewed and interpreted this ECG.   Date: 01/04/2018  EKG Time: 120  Rate: 113  Rhythm: sinus tachycardia  Axis: normal  Intervals:none  ST&T Change: none  ____________________________________________  RADIOLOGY  ED MD interpretation:  CT head: no acute intracranial abnormality.  Official radiology report(s): Ct Head Wo Contrast  Result Date: 01/03/2018 CLINICAL DATA:  42 year old female with seizure. EXAM: CT HEAD WITHOUT CONTRAST TECHNIQUE: Contiguous axial images were obtained from the base of the skull through the vertex without intravenous contrast. COMPARISON:  None. FINDINGS: Brain: No evidence of acute infarction, hemorrhage, hydrocephalus, extra-axial collection or mass lesion/mass effect. Vascular: No hyperdense vessel or unexpected calcification. Skull: Normal. Negative for fracture or focal lesion. Sinuses/Orbits: No acute finding. Other: None. IMPRESSION: Unremarkable noncontrast CT of the brain. Electronically Signed   By: Elgie Collard M.D.   On: 01/03/2018 23:47    ____________________________________________   PROCEDURES  Procedure(s) performed: None  Procedures  Critical Care performed: No  ____________________________________________   INITIAL IMPRESSION / ASSESSMENT AND PLAN / ED COURSE  As part of my medical decision making, I reviewed the following data within the electronic MEDICAL RECORD NUMBER Notes from prior ED visits and [place] Controlled Substance Database   This is a 42 year old female who comes into the hospital today with a seizure.  The patient has a history of alcohol abuse and has not drink much today.  My differential diagnosis includes alcohol withdrawal seizures, new onset seizure, brain mass, electrolyte abnormality.  I did check some blood work on the patient.  The patient does not have any significant electrolyte abnormalities.  Her anion gap is 17.  The patient CT scan is unremarkable.  The patient is tachycardic  and shaky.  We did give her some Ativan when she arrived and placed her on the [place] protocol.  She will receive some thiamine and folate as well as some normal saline.  Given the patient's vital signs as well as her history she will be admitted to the hospitalist service for further evaluation.      ____________________________________________   FINAL CLINICAL IMPRESSION(S) / ED DIAGNOSES  Final diagnoses:  Seizure (HCC)  Alcohol withdrawal  syndrome with complication [facility] [place])     ED Discharge Orders    None       Note:  This document was prepared using Dragon voice recognition software and may include unintentional dictation errors.    Rebecka Apley, MD 01/04/18 [id number]

## 2018-01-04 NOTE — Progress Notes (Addendum)
Pt complaining of mouth pain. Dr Amado CoeGouru aware and ordered PRN magic mouthwash earlier today. Pt has tried the magic mouthwash and while she states it helped for a little while, the pain has returned. Dr. Amado CoeGouru notified and she requested I consult with the pharmacy to see what else we can try for mouth pain. Per pharmacy, we have 2% viscous lidocaine available for mouth pain at this hospital. Dr. Amado CoeGouru notified and she stated to order the viscous lidocaine 2% PO (swish and spit) TID PRN mouth pain. Orders placed in epic.

## 2018-01-04 NOTE — Discharge Summary (Signed)
Patient was seen and examined , jittery and anxious  answering questions appropriately., CIWA score is 9 according to the nurse.  Eventually she has decided to leave the hospital AGAINST MEDICAL ADVICE, the risks of leaving the hospital AGAINST MEDICAL ADVICE were explained still patient decided to leave the hospital

## 2018-01-04 NOTE — ED Notes (Signed)
Dr Anne HahnWillis in with pt. Pt reports feeling sleepy.

## 2018-01-04 NOTE — H&P (Signed)
[facility] - [place] at [facility]   PATIENT NAME: Terri Mann    MR#:  [id number]  DATE OF BIRTH:  06-18-1976  DATE OF ADMISSION:  01/03/2018  PRIMARY CARE PHYSICIAN: Raynelle Bring   REQUESTING/REFERRING PHYSICIAN: Zenda Alpers, MD  CHIEF COMPLAINT:   Chief Complaint  Patient presents with  . Seizures    HISTORY OF PRESENT ILLNESS:  Terri Mann  is a 42 y.o. female who presents with seizure.  Patient has a significant history of alcohol use, and over the last day had reduced use of alcohol.  She had a seizure at home witnessed by her boyfriend.  Here in the ED her alcohol level was 11.  Patient appears to be in withdrawal.  Hospitalist were called for admission.  PAST MEDICAL HISTORY:   Past Medical History:  Diagnosis Date  . Asthma   . Chronic back pain   . GERD (gastroesophageal reflux disease)   . Pancreatitis   . Thyroid disease     PAST SURGICAL HISTORY:   Past Surgical History:  Procedure Laterality Date  . tubial ligation       SOCIAL HISTORY:   Social History   Tobacco Use  . Smoking status: Current Some Day Smoker    Packs/day: 2.00    Types: Cigarettes  . Smokeless tobacco: Never Used  Substance Use Topics  . Alcohol use: Yes    Alcohol/week: 84.0 oz    Types: 140 Shots of liquor per week    FAMILY HISTORY:   Family History  Problem Relation Age of Onset  . Thyroid disease Mother   . Heart attack Father   . Thyroid disease Sister   . Liver disease Brother     DRUG ALLERGIES:  No Known Allergies  MEDICATIONS AT HOME:   Prior to Admission medications   Medication Sig Start Date End Date Taking? Authorizing Provider  cyclobenzaprine (FLEXERIL) 10 MG tablet Take 10 mg by mouth 3 (three) times daily as needed for muscle spasms.   Yes [provider]  DULoxetine (CYMBALTA) 60 MG capsule Take 60 mg by mouth daily.   Yes [provider]  FLOVENT HFA 220 MCG/ACT inhaler Inhale 1 puff into  the lungs 2 (two) times daily.   Yes [provider]  levothyroxine (SYNTHROID) 100 MCG tablet Take 1 tablet (100 mcg total) by mouth daily. 08/25/17 01/03/19 Yes Dionne Bucy, MD  lipase/protease/amylase (CREON) 12000 units CPEP capsule Take 36,000 Units by mouth 2 (two) times daily with a meal.   Yes [provider]  lipase/protease/amylase (CREON) 12000 units CPEP capsule Take 12,000 Units by mouth daily. With snacks   Yes [provider]  loratadine (CLARITIN) 10 MG tablet Take 10 mg by mouth daily as needed for allergies.   Yes [provider]  meloxicam (MOBIC) 7.5 MG tablet Take 7.5 mg by mouth 2 (two) times daily.   Yes [provider]  PROAIR HFA 108 (90 Base) MCG/ACT inhaler Inhale 1-2 puffs into the lungs every 6 (six) hours as needed for shortness of breath.   Yes [provider]  STIOLTO RESPIMAT 2.5-2.5 MCG/ACT AERS Inhale 2 puffs into the lungs daily.   Yes [provider]  sucralfate (CARAFATE) 1 g tablet Take 1 tablet (1 g total) by mouth 4 (four) times daily -  with meals and at bedtime. 08/13/16  Yes Altamese Dilling, MD  Vitamin D, Ergocalciferol, (DRISDOL) 50000 units CAPS capsule Take 50,000 Units by mouth every 7 (seven) days. Tuesdays  Yes [provider]  dicyclomine (BENTYL) 20 MG tablet Take 1 tablet (20 mg total) by mouth 3 (three) times daily as needed for spasms. Patient not taking: Reported on 08/25/2017 01/01/17 01/01/18  Myrna Blazer, MD  pantoprazole (PROTONIX) 40 MG tablet Take 1 tablet (40 mg total) by mouth daily. Patient taking differently: Take 40 mg by mouth 2 (two) times daily.  12/30/16 12/30/17  Willy Eddy, MD    REVIEW OF SYSTEMS:  Review of Systems  Unable to perform ROS: Acuity of condition    Patient is able to wake up and talk some, but does not remember the incident is largely still postictal. VITAL SIGNS:   Vitals:   01/03/18 2308 01/03/18 2330  01/04/18 0000 01/04/18 0030  BP: 112/73 123/72 (!) 132/102 132/83  Pulse: (!) 103  (!) 109 (!) 111  Resp:   18 20  Temp:      SpO2:   93% 93%  Weight:      Height:       Wt Readings from Last 3 Encounters:  01/03/18 72.6 kg (160 lb)  08/25/17 76.2 kg (168 lb)  06/09/17 77.1 kg (170 lb)    PHYSICAL EXAMINATION:  Physical Exam  Vitals reviewed. Constitutional: She is oriented to person, place, and time. She appears well-developed and well-nourished. No distress.  HENT:  Head: Normocephalic and atraumatic.  Mouth/Throat: Oropharynx is clear and moist.  Eyes: Conjunctivae and EOM are normal. Pupils are equal, round, and reactive to light. No scleral icterus.  Neck: Normal range of motion. Neck supple. No JVD present. No thyromegaly present.  Cardiovascular: Normal rate, regular rhythm and intact distal pulses. Exam reveals no gallop and no friction rub.  No murmur heard. Respiratory: Effort normal and breath sounds normal. No respiratory distress. She has no wheezes. She has no rales.  GI: Soft. Bowel sounds are normal. She exhibits no distension. There is no tenderness.  Musculoskeletal: Normal range of motion. She exhibits no edema.  No arthritis, no gout  Lymphadenopathy:    She has no cervical adenopathy.  Neurological: She is oriented to person, place, and time. No cranial nerve deficit.  Patient is arousable.  She is oriented to person place time, and circumstance although she does not remember the incident.  Skin: Skin is warm and dry. No rash noted. No erythema.  Psychiatric:  Unable to fully assess due to lethargy from postictal state    LABORATORY PANEL:   CBC Recent Labs  Lab 01/03/18 2230  WBC 9.3  HGB 13.3  HCT 38.9  PLT 135*   ------------------------------------------------------------------------------------------------------------------  Chemistries  Recent Labs  Lab 01/03/18 2230  NA 134*  K 3.4*  CL 97*  CO2 20*  GLUCOSE 126*  BUN 11   CREATININE 0.81  CALCIUM 9.2  AST 155*  ALT 78*  ALKPHOS 98  BILITOT 1.0   ------------------------------------------------------------------------------------------------------------------  Cardiac Enzymes No results for input(s): TROPONINI in the last 168 hours. ------------------------------------------------------------------------------------------------------------------  RADIOLOGY:  Ct Head Wo Contrast  Result Date: 01/03/2018 CLINICAL DATA:  42 year old female with seizure. EXAM: CT HEAD WITHOUT CONTRAST TECHNIQUE: Contiguous axial images were obtained from the base of the skull through the vertex without intravenous contrast. COMPARISON:  None. FINDINGS: Brain: No evidence of acute infarction, hemorrhage, hydrocephalus, extra-axial collection or mass lesion/mass effect. Vascular: No hyperdense vessel or unexpected calcification. Skull: Normal. Negative for fracture or focal lesion. Sinuses/Orbits: No acute finding. Other: None. IMPRESSION: Unremarkable noncontrast CT of the brain. Electronically Signed   By: Burtis Junes  Radparvar M.D.   On: 01/03/2018 23:47    EKG:   Orders placed or performed during the hospital encounter of 01/03/18  . ED EKG  . ED EKG    IMPRESSION AND PLAN:  Principal Problem:   Alcohol withdrawal seizure without complication (HCC) -she has a history of drinking significant amounts of alcohol daily.  Over the last 24 hours she has had a very reduced amount of alcohol, and her alcohol level in the ED is only 11.  Will use Ativan with CIWA protocol for withdrawal, and we will have as needed additional Ativan as needed if she seizes again.  If she does have further seizure activity could consider neurology consult at that point. Active Problems:   Pancreatic insufficiency -continue home dose pancreatic enzyme replacement   Asthma -home dose inhalers   GERD (gastroesophageal reflux disease) -home dose PPI   Hypothyroidism -dose thyroid replacement  All the  records are reviewed and case discussed with ED provider. Management plans discussed with the patient and/or family.  DVT PROPHYLAXIS: SubQ lovenox  GI PROPHYLAXIS: PPI  ADMISSION STATUS: Inpatient  CODE STATUS: Full    Code Status Orders  (From admission, onward)        Start     Ordered   01/03/18 2323  Full code  Continuous     01/03/18 2324    Code Status History    Date Active Date Inactive Code Status Order ID Comments User Context   08/11/2016 01:16 08/13/2016 18:14 Full Code [id number]  Hugelmeyer, Jon GillsAlexis, DO Inpatient      TOTAL TIME TAKING CARE OF THIS PATIENT: 45 minutes.   Norabelle Kondo FIELDING 01/04/2018, 12:46 AM  [facility] [facility]  Office  [phone]  CC: Primary care physician; [facility]  Note:  This document was prepared using Dragon voice recognition software and may include unintentional dictation errors.

## 2018-01-04 NOTE — Progress Notes (Signed)
Patient arrived to the floor A+O with no signs of distress but tired.  Ambulated to bed

## 2018-01-06 ENCOUNTER — Telehealth (HOSPITAL_COMMUNITY): Payer: Self-pay | Admitting: Gastroenterology

## 2018-01-07 ENCOUNTER — Ambulatory Visit
Admission: RE | Admit: 2018-01-07 | Discharge: 2018-01-07 | Disposition: A | Discharge status: Home / Self Care | Payer: Medicaid Other | Source: Ambulatory Visit | Attending: Gastroenterology | Admitting: Gastroenterology

## 2018-01-18 ENCOUNTER — Ambulatory Visit
Admit: 2018-01-18 | Discharge: 2018-01-21 | Disposition: A | Discharge status: Home / Self Care | Payer: MEDICAID | Admitting: "Endocrinology

## 2018-01-18 DIAGNOSIS — R569 Unspecified convulsions: Principal | ICD-10-CM

## 2018-01-20 ENCOUNTER — Ambulatory Visit
Admission: RE | Admit: 2018-01-20 | Discharge: 2018-01-20 | Disposition: A | Discharge status: Home / Self Care | Payer: Medicaid Other | Source: Ambulatory Visit | Attending: Gastroenterology | Admitting: Gastroenterology

## 2018-01-21 MED ORDER — CHLORDIAZEPOXIDE 25 MG CAPSULE: capsule | 0 refills | 0 days | Status: AC

## 2018-01-21 MED ORDER — CHLORDIAZEPOXIDE 25 MG CAPSULE
ORAL_CAPSULE | ORAL | 0 refills | 0.00000 days | Status: CP
Start: 2018-01-21 — End: 2018-07-09

## 2018-01-22 MED ORDER — NICOTINE 14 MG/24 HR DAILY TRANSDERMAL PATCH
MEDICATED_PATCH | Freq: Every day | TRANSDERMAL | 0 refills | 0 days | Status: CP
Start: 2018-01-22 — End: 2019-07-09

## 2018-01-30 ENCOUNTER — Ambulatory Visit
Admission: RE | Admit: 2018-01-30 | Discharge: 2018-01-30 | Disposition: A | Discharge status: Home / Self Care | Payer: Medicaid Other | Source: Ambulatory Visit | Attending: Gastroenterology | Admitting: Gastroenterology

## 2018-01-30 DIAGNOSIS — K760 Fatty (change of) liver, not elsewhere classified: Secondary | ICD-10-CM | POA: Diagnosis not present

## 2018-01-30 DIAGNOSIS — K7689 Other specified diseases of liver: Secondary | ICD-10-CM | POA: Insufficient documentation

## 2018-01-30 DIAGNOSIS — R1084 Generalized abdominal pain: Secondary | ICD-10-CM

## 2018-01-30 DIAGNOSIS — R112 Nausea with vomiting, unspecified: Secondary | ICD-10-CM | POA: Insufficient documentation

## 2018-01-30 MED ORDER — GADOBENATE DIMEGLUMINE 529 MG/ML IV SOLN
15.0000 mL | Freq: Once | INTRAVENOUS | Status: AC | PRN
  Administered 2018-01-30: 15 mL via INTRAVENOUS

## 2018-02-05 ENCOUNTER — Other Ambulatory Visit: Payer: Self-pay | Admitting: Gastroenterology

## 2018-02-05 DIAGNOSIS — R109 Unspecified abdominal pain: Secondary | ICD-10-CM

## 2018-02-05 DIAGNOSIS — R1310 Dysphagia, unspecified: Secondary | ICD-10-CM

## 2018-02-11 ENCOUNTER — Ambulatory Visit
Admission: RE | Admit: 2018-02-11 | Discharge: 2018-02-11 | Disposition: A | Discharge status: Home / Self Care | Payer: Medicaid Other | Source: Ambulatory Visit | Attending: Gastroenterology | Admitting: Gastroenterology

## 2018-02-11 DIAGNOSIS — K222 Esophageal obstruction: Secondary | ICD-10-CM | POA: Diagnosis not present

## 2018-02-11 DIAGNOSIS — R109 Unspecified abdominal pain: Secondary | ICD-10-CM

## 2018-02-11 DIAGNOSIS — R1310 Dysphagia, unspecified: Secondary | ICD-10-CM

## 2018-02-24 ENCOUNTER — Ambulatory Visit: Admit: 2018-02-24 | Payer: Medicaid Other | Admitting: Internal Medicine

## 2018-02-24 SURGERY — ESOPHAGOGASTRODUODENOSCOPY (EGD) WITH PROPOFOL
Anesthesia: General

## 2018-04-22 ENCOUNTER — Encounter: Payer: Self-pay | Admitting: Neurology

## 2018-04-22 ENCOUNTER — Telehealth: Payer: Self-pay | Admitting: *Deleted

## 2018-04-22 ENCOUNTER — Ambulatory Visit: Payer: Medicaid Other | Admitting: Neurology

## 2018-04-22 NOTE — Telephone Encounter (Signed)
No showed new patient appointment. 

## 2018-05-13 ENCOUNTER — Emergency Department
Admission: EM | Admit: 2018-05-13 | Discharge: 2018-05-13 | Disposition: A | Discharge status: Home / Self Care | Payer: Medicaid Other | Attending: Emergency Medicine | Admitting: Emergency Medicine

## 2018-05-13 ENCOUNTER — Encounter: Payer: Self-pay | Admitting: Emergency Medicine

## 2018-05-13 ENCOUNTER — Emergency Department: Payer: Medicaid Other

## 2018-05-13 ENCOUNTER — Other Ambulatory Visit: Payer: Self-pay

## 2018-05-13 DIAGNOSIS — Z79899 Other long term (current) drug therapy: Secondary | ICD-10-CM | POA: Insufficient documentation

## 2018-05-13 DIAGNOSIS — F17210 Nicotine dependence, cigarettes, uncomplicated: Secondary | ICD-10-CM | POA: Insufficient documentation

## 2018-05-13 DIAGNOSIS — R109 Unspecified abdominal pain: Secondary | ICD-10-CM | POA: Diagnosis present

## 2018-05-13 DIAGNOSIS — E039 Hypothyroidism, unspecified: Secondary | ICD-10-CM | POA: Diagnosis not present

## 2018-05-13 DIAGNOSIS — K529 Noninfective gastroenteritis and colitis, unspecified: Secondary | ICD-10-CM | POA: Diagnosis not present

## 2018-05-13 DIAGNOSIS — J45909 Unspecified asthma, uncomplicated: Secondary | ICD-10-CM | POA: Insufficient documentation

## 2018-05-13 LAB — COMPREHENSIVE METABOLIC PANEL
ALBUMIN: 4.2 g/dL (ref 3.5–5.0)
ALK PHOS: 112 U/L (ref 38–126)
ALT: 55 U/L — ABNORMAL HIGH (ref 0–44)
ANION GAP: 17 — AB (ref 5–15)
AST: 119 U/L — ABNORMAL HIGH (ref 15–41)
BILIRUBIN TOTAL: 1.4 mg/dL — AB (ref 0.3–1.2)
BUN: 11 mg/dL (ref 6–20)
CALCIUM: 8.4 mg/dL — AB (ref 8.9–10.3)
CO2: 20 mmol/L — ABNORMAL LOW (ref 22–32)
Chloride: 99 mmol/L (ref 98–111)
Creatinine, Ser: 0.55 mg/dL (ref 0.44–1.00)
GFR calc Af Amer: >60 mL/min (ref >60)
GFR calc non Af Amer: >60 mL/min (ref >60)
GLUCOSE: 86 mg/dL (ref 70–99)
POTASSIUM: 2.9 mmol/L — AB (ref 3.5–5.1)
Sodium: 136 mmol/L (ref 135–145)
TOTAL PROTEIN: 6.8 g/dL (ref 6.5–8.1)

## 2018-05-13 LAB — URINALYSIS, COMPLETE (UACMP) WITH MICROSCOPIC
BILIRUBIN URINE: NEGATIVE
GLUCOSE, UA: NEGATIVE mg/dL
HGB URINE DIPSTICK: NEGATIVE
Ketones, ur: 20 mg/dL — AB
LEUKOCYTES UA: NEGATIVE
NITRITE: NEGATIVE
PH: 5 (ref 5.0–8.0)
Protein, ur: 100 mg/dL — AB
Specific Gravity, Urine: 1.018 (ref 1.005–1.030)

## 2018-05-13 LAB — CBC
HCT: 36.4 % (ref 35.0–47.0)
Hemoglobin: 12.4 g/dL (ref 12.0–16.0)
MCH: 39.8 pg — ABNORMAL HIGH (ref 26.0–34.0)
MCHC: 34.2 g/dL (ref 32.0–36.0)
MCV: 116.3 fL — ABNORMAL HIGH (ref 80.0–100.0)
Platelets: 87 10*3/uL — ABNORMAL LOW (ref 150–440)
RBC: 3.13 MIL/uL — ABNORMAL LOW (ref 3.80–5.20)
RDW: 21.1 % — AB (ref 11.5–14.5)
WBC: 7.6 10*3/uL (ref 3.6–11.0)

## 2018-05-13 LAB — POCT PREGNANCY, URINE: PREG TEST UR: NEGATIVE

## 2018-05-13 LAB — LIPASE, BLOOD: Lipase: 25 U/L (ref 11–51)

## 2018-05-13 MED ORDER — SODIUM CHLORIDE 0.9 % IV SOLN
1000.0000 mL | Freq: Once | INTRAVENOUS | Status: AC
  Administered 2018-05-13: 1000 mL via INTRAVENOUS

## 2018-05-13 MED ORDER — HYDROCODONE-ACETAMINOPHEN 5-325 MG PO TABS
1.0000 | ORAL_TABLET | Freq: Once | ORAL | Status: AC
  Administered 2018-05-13: 1 via ORAL
  Filled 2018-05-13: qty 1

## 2018-05-13 MED ORDER — TRAMADOL HCL 50 MG PO TABS: 50.0000 mg | ORAL_TABLET | Freq: Once | ORAL | Status: DC

## 2018-05-13 MED ORDER — ONDANSETRON HCL 4 MG/2ML IJ SOLN
4.0000 mg | Freq: Once | INTRAMUSCULAR | Status: AC
  Administered 2018-05-13: 4 mg via INTRAVENOUS
  Filled 2018-05-13: qty 2

## 2018-05-13 MED ORDER — CIPROFLOXACIN HCL 500 MG PO TABS: 500.0000 mg | ORAL_TABLET | Freq: Two times a day (BID) | ORAL | 0 refills | Status: DC

## 2018-05-13 MED ORDER — MORPHINE SULFATE (PF) 4 MG/ML IV SOLN
4.0000 mg | Freq: Once | INTRAVENOUS | Status: AC
  Administered 2018-05-13: 4 mg via INTRAVENOUS
  Filled 2018-05-13: qty 1

## 2018-05-13 MED ORDER — IOPAMIDOL (ISOVUE-300) INJECTION 61%
30.0000 mL | Freq: Once | INTRAVENOUS | Status: AC
  Administered 2018-05-13: 30 mL via ORAL

## 2018-05-13 MED ORDER — IOHEXOL 300 MG/ML  SOLN
100.0000 mL | Freq: Once | INTRAMUSCULAR | Status: AC | PRN
  Administered 2018-05-13: 100 mL via INTRAVENOUS

## 2018-05-13 MED ORDER — METRONIDAZOLE 500 MG PO TABS
500.0000 mg | ORAL_TABLET | Freq: Once | ORAL | Status: AC
  Administered 2018-05-13: 500 mg via ORAL
  Filled 2018-05-13: qty 1

## 2018-05-13 MED ORDER — CIPROFLOXACIN HCL 500 MG PO TABS
500.0000 mg | ORAL_TABLET | Freq: Once | ORAL | Status: AC
  Administered 2018-05-13: 500 mg via ORAL
  Filled 2018-05-13: qty 1

## 2018-05-13 MED ORDER — HYDROCODONE-ACETAMINOPHEN 5-325 MG PO TABS: 1.0000 | ORAL_TABLET | Freq: Four times a day (QID) | ORAL | 0 refills | Status: DC | PRN

## 2018-05-13 MED ORDER — METRONIDAZOLE 500 MG PO TABS: 500.0000 mg | ORAL_TABLET | Freq: Two times a day (BID) | ORAL | 0 refills | Status: DC

## 2018-05-13 NOTE — ED Triage Notes (Signed)
Pt comes into the ED via POV c/o left abdominal pain and nausea.  Patient states that she hasn't had a bowel movement in 4 days.  Patient has h/o pancreatitis but she states this feels different then when she normally has it.  Patient has even and unlabored respirations.  Patient states she has had decreased intake and has increased fatigue.

## 2018-05-13 NOTE — ED Notes (Signed)
Discussed K+=2.9 w/ EDP.  No new orders received at this time.

## 2018-05-13 NOTE — ED Provider Notes (Signed)
[facility] Emergency Department Provider Note   ____________________________________________    I have reviewed the triage vital signs and the nursing notes.   HISTORY  Chief Complaint Abdominal Pain     HPI Terri Mann is a 42 y.o. female who presents with complaints of abdominal pain.  Patient reports she has a history of pink otitis but this does not feel like pancreatitis at all.  She complains of left lower quadrant abdominal pain which she reports is severe and worse with walking.  She reports a history of colitis in the past and this feels somewhat similar.  She reports she is been constipated over the last 3 days.  She also reports foul-smelling urine.  Denies fevers or chills.  Occasional nausea.   Past Medical History:  Diagnosis Date  . Asthma   . Chronic back pain   . GERD (gastroesophageal reflux disease)   . Pancreatitis   . Thyroid disease     Patient Active Problem List   Diagnosis Date Noted  . Asthma 01/04/2018  . GERD (gastroesophageal reflux disease) 01/04/2018  . Hypothyroidism 01/04/2018  . Alcohol withdrawal seizure without complication (HCC) 01/04/2018  . Alcohol withdrawal seizure (HCC) 01/04/2018  . Acute pancreatitis 08/11/2016  . Pancreatic insufficiency 08/10/2016    Past Surgical History:  Procedure Laterality Date  . tubial ligation       Prior to Admission medications   Medication Sig Start Date End Date Taking? Authorizing Provider  amLODipine (NORVASC) 5 MG tablet Take 5 mg by mouth daily. 03/11/18  Yes [provider]  DULoxetine (CYMBALTA) 60 MG capsule Take 60 mg by mouth daily.   Yes [provider]  FLOVENT HFA 220 MCG/ACT inhaler Inhale 1 puff into the lungs 2 (two) times daily.   Yes [provider]  levothyroxine (SYNTHROID) 100 MCG tablet Take 1 tablet (100 mcg total) by mouth daily. 08/25/17 01/03/19 Yes Dionne Bucy, MD  loratadine (CLARITIN) 10 MG tablet  Take 10 mg by mouth daily as needed for allergies.   Yes [provider]  pantoprazole (PROTONIX) 40 MG tablet Take 1 tablet (40 mg total) by mouth daily. Patient taking differently: Take 40 mg by mouth 2 (two) times daily as needed (Acid reflux).  12/30/16 05/14/19 Yes Willy Eddy, MD  sucralfate (CARAFATE) 1 g tablet Take 1 tablet (1 g total) by mouth 4 (four) times daily -  with meals and at bedtime. 08/13/16  Yes Altamese Dilling, MD  Vitamin D, Ergocalciferol, (DRISDOL) 50000 units CAPS capsule Take 50,000 Units by mouth every 7 (seven) days. Tuesdays   Yes [provider]  ciprofloxacin (CIPRO) 500 MG tablet Take 1 tablet (500 mg total) by mouth 2 (two) times daily. 05/13/18   Jene Every, MD  dicyclomine (BENTYL) 20 MG tablet Take 1 tablet (20 mg total) by mouth 3 (three) times daily as needed for spasms. Patient not taking: Reported on 08/25/2017 01/01/17 01/01/18  Myrna Blazer, MD  HYDROcodone-acetaminophen (NORCO/VICODIN) 5-325 MG tablet Take 1 tablet by mouth every 6 (six) hours as needed for severe pain. 05/13/18   Jene Every, MD  metroNIDAZOLE (FLAGYL) 500 MG tablet Take 1 tablet (500 mg total) by mouth 2 (two) times daily after a meal. 05/13/18   Jene Every, MD     Allergies Patient has no known allergies.  Family History  Problem Relation Age of Onset  . Thyroid disease Mother   . Heart attack Father   . Thyroid disease Sister   .  Liver disease Brother     Social History Social History   Tobacco Use  . Smoking status: Current Some Day Smoker    Packs/day: 2.00    Types: Cigarettes  . Smokeless tobacco: Never Used  Substance Use Topics  . Alcohol use: Yes    Alcohol/week: 84.0 oz    Types: 140 Shots of liquor per week  . Drug use: No    Review of Systems  Constitutional: No fever/chills Eyes: No visual changes.  ENT: No sore throat. Cardiovascular: Denies chest pain. Respiratory: Denies shortness of  breath. Gastrointestinal: As above Genitourinary: As above Musculoskeletal: Negative for back pain. Skin: Negative for rash. Neurological: Negative for headaches or weakness   ____________________________________________   PHYSICAL EXAM:  VITAL SIGNS: ED Triage Vitals  Enc Vitals Group     BP 05/13/18 1414 133/85     Pulse Rate 05/13/18 1414 (!) 117     Resp --      Temp 05/13/18 1414 98.7 F (37.1 C)     Temp Source 05/13/18 1414 Oral     SpO2 05/13/18 1414 98 %     Weight 05/13/18 1414 73.9 kg (163 lb)     Height 05/13/18 1414 1.715 m (5' 7.5")     Head Circumference --      Peak Flow --      Pain Score 05/13/18 1419 10     Pain Loc --      Pain Edu? --      Excl. in GC? --     Constitutional: Alert and oriented. No acute distress. Pleasant and interactive Eyes: Conjunctivae are normal.   Nose: No congestion/rhinnorhea. Mouth/Throat: Mucous membranes are moist.    Cardiovascular: Normal rate, regular rhythm. Grossly normal heart sounds.  Good peripheral circulation. Respiratory: Normal respiratory effort.  No retractions. Lungs CTAB. Gastrointestinal: Left lower quadrant tenderness to palpation, no significant distention, no CVA tenderness  Musculoskeletal:  Warm and well perfused Neurologic:  Normal speech and language. No gross focal neurologic deficits are appreciated.  Skin:  Skin is warm, dry and intact. No rash noted. Psychiatric: Mood and affect are normal. Speech and behavior are normal.  ____________________________________________   LABS (all labs ordered are listed, but only abnormal results are displayed)  Labs Reviewed  COMPREHENSIVE METABOLIC PANEL - Abnormal; Notable for the following components:      Result Value   Potassium 2.9 (*)    CO2 20 (*)    Calcium 8.4 (*)    AST 119 (*)    ALT 55 (*)    Total Bilirubin 1.4 (*)    Anion gap 17 (*)    All other components within normal limits  CBC - Abnormal; Notable for the following  components:   RBC 3.13 (*)    MCV 116.3 (*)    MCH 39.8 (*)    RDW 21.1 (*)    Platelets 87 (*)    All other components within normal limits  URINALYSIS, COMPLETE (UACMP) WITH MICROSCOPIC - Abnormal; Notable for the following components:   Color, Urine AMBER (*)    APPearance CLOUDY (*)    Ketones, ur 20 (*)    Protein, ur 100 (*)    Bacteria, UA RARE (*)    All other components within normal limits  LIPASE, BLOOD  POC URINE PREG, ED  POCT PREGNANCY, URINE   ____________________________________________  EKG  None ____________________________________________  RADIOLOGY   ____________________________________________   PROCEDURES  Procedure(s) performed: No  Procedures   Critical Care  performed: No ____________________________________________   INITIAL IMPRESSION / ASSESSMENT AND PLAN / ED COURSE  Pertinent labs & imaging results that were available during my care of the patient were reviewed by me and considered in my medical decision making (see chart for details).  Patient presents with complaints of left lower quadrant abdominal pain.  Normal white blood cell count, lipase is normal.  Moderate tenderness palpation left lower quadrant, suspect diverticulitis versus colitis.  Also possibly UTI, will obtain CT imaging.  Give IV morphine, IV Zofran, IV fluids and reevaluate.  Patient felt significant better after treatment.  CT scan demonstrates findings consistent with sigmoid colitis, will treat with Cipro Flagyl, pain medications, outpatient follow-up.  Return precautions discussed    ____________________________________________   FINAL CLINICAL IMPRESSION(S) / ED DIAGNOSES  Final diagnoses:  Colitis        Note:  This document was prepared using Dragon voice recognition software and may include unintentional dictation errors.    Jene Every, MD 05/13/18 2014

## 2018-06-03 LAB — HIV ANTIBODY (ROUTINE TESTING W REFLEX): HIV Screen 4th Generation wRfx: NONREACTIVE

## 2018-06-24 ENCOUNTER — Emergency Department
Admission: EM | Admit: 2018-06-24 | Discharge: 2018-06-24 | Disposition: A | Discharge status: Home / Self Care | Payer: Medicaid Other | Attending: Emergency Medicine | Admitting: Emergency Medicine

## 2018-06-24 ENCOUNTER — Other Ambulatory Visit: Payer: Self-pay

## 2018-06-24 ENCOUNTER — Encounter: Payer: Self-pay | Admitting: *Deleted

## 2018-06-24 DIAGNOSIS — Z5321 Procedure and treatment not carried out due to patient leaving prior to being seen by health care provider: Secondary | ICD-10-CM | POA: Insufficient documentation

## 2018-06-24 DIAGNOSIS — F10129 Alcohol abuse with intoxication, unspecified: Secondary | ICD-10-CM | POA: Diagnosis not present

## 2018-06-24 NOTE — ED Notes (Signed)
No answer when called for exam room x2.

## 2018-06-24 NOTE — ED Triage Notes (Signed)
Pt ambulatory to triage.  Pt requesting help with etoh problem.  Last etoh drink was 6 hours ago.  Denies drug use.  Denies SI or HI.  Pt alert.

## 2018-07-08 ENCOUNTER — Ambulatory Visit
Admit: 2018-07-08 | Discharge: 2018-07-10 | Disposition: A | Discharge status: Home / Self Care | Payer: MEDICAID | Admitting: Hospitalist

## 2018-07-10 MED ORDER — CHLORDIAZEPOXIDE 25 MG CAPSULE
ORAL_CAPSULE | ORAL | 0 refills | 0.00000 days | Status: CP
Start: 2018-07-10 — End: 2018-07-10

## 2018-07-10 MED ORDER — CHLORDIAZEPOXIDE 25 MG CAPSULE: capsule | 0 refills | 0 days | Status: AC

## 2018-10-01 ENCOUNTER — Emergency Department
Admission: EM | Admit: 2018-10-01 | Discharge: 2018-10-02 | Disposition: A | Discharge status: Home / Self Care | Payer: Medicaid Other | Attending: Emergency Medicine | Admitting: Emergency Medicine

## 2018-10-01 DIAGNOSIS — J45909 Unspecified asthma, uncomplicated: Secondary | ICD-10-CM | POA: Insufficient documentation

## 2018-10-01 DIAGNOSIS — E039 Hypothyroidism, unspecified: Secondary | ICD-10-CM | POA: Insufficient documentation

## 2018-10-01 DIAGNOSIS — R45851 Suicidal ideations: Secondary | ICD-10-CM

## 2018-10-01 DIAGNOSIS — Z23 Encounter for immunization: Secondary | ICD-10-CM | POA: Diagnosis not present

## 2018-10-01 DIAGNOSIS — F1010 Alcohol abuse, uncomplicated: Secondary | ICD-10-CM

## 2018-10-01 DIAGNOSIS — R4182 Altered mental status, unspecified: Secondary | ICD-10-CM | POA: Diagnosis present

## 2018-10-01 DIAGNOSIS — F1994 Other psychoactive substance use, unspecified with psychoactive substance-induced mood disorder: Secondary | ICD-10-CM

## 2018-10-01 DIAGNOSIS — Z046 Encounter for general psychiatric examination, requested by authority: Secondary | ICD-10-CM | POA: Insufficient documentation

## 2018-10-01 DIAGNOSIS — Y908 Blood alcohol level of 240 mg/100 ml or more: Secondary | ICD-10-CM | POA: Insufficient documentation

## 2018-10-01 DIAGNOSIS — Z79899 Other long term (current) drug therapy: Secondary | ICD-10-CM | POA: Diagnosis not present

## 2018-10-01 DIAGNOSIS — F17210 Nicotine dependence, cigarettes, uncomplicated: Secondary | ICD-10-CM | POA: Diagnosis not present

## 2018-10-01 DIAGNOSIS — F10920 Alcohol use, unspecified with intoxication, uncomplicated: Secondary | ICD-10-CM

## 2018-10-01 DIAGNOSIS — F10220 Alcohol dependence with intoxication, uncomplicated: Secondary | ICD-10-CM | POA: Diagnosis not present

## 2018-10-01 LAB — COMPREHENSIVE METABOLIC PANEL WITH GFR
ALT: 13 U/L (ref 0–44)
AST: 24 U/L (ref 15–41)
Albumin: 4.1 g/dL (ref 3.5–5.0)
Alkaline Phosphatase: 67 U/L (ref 38–126)
Anion gap: 12 (ref 5–15)
BUN: 19 mg/dL (ref 6–20)
CO2: 23 mmol/L (ref 22–32)
Calcium: 8.7 mg/dL — ABNORMAL LOW (ref 8.9–10.3)
Chloride: 110 mmol/L (ref 98–111)
Creatinine, Ser: 0.56 mg/dL (ref 0.44–1.00)
GFR calc Af Amer: >60 mL/min (ref >60)
GFR calc non Af Amer: >60 mL/min (ref >60)
Glucose, Bld: 100 mg/dL — ABNORMAL HIGH (ref 70–99)
Potassium: 3.5 mmol/L (ref 3.5–5.1)
Sodium: 145 mmol/L (ref 135–145)
Total Bilirubin: 0.2 mg/dL — ABNORMAL LOW (ref 0.3–1.2)
Total Protein: 7.2 g/dL (ref 6.5–8.1)

## 2018-10-01 LAB — CBC WITH DIFFERENTIAL/PLATELET
Abs Immature Granulocytes: 0.02 10*3/uL (ref 0.00–0.07)
Basophils Absolute: 0 10*3/uL (ref 0.0–0.1)
Basophils Relative: 0 %
EOS ABS: 0 10*3/uL (ref 0.0–0.5)
EOS PCT: 0 %
HEMATOCRIT: 38.8 % (ref 36.0–46.0)
Hemoglobin: 12.8 g/dL (ref 12.0–15.0)
Immature Granulocytes: 0 %
LYMPHS ABS: 2.3 10*3/uL (ref 0.7–4.0)
Lymphocytes Relative: 39 %
MCH: 33.2 pg (ref 26.0–34.0)
MCHC: 33 g/dL (ref 30.0–36.0)
MCV: 100.5 fL — AB (ref 80.0–100.0)
Monocytes Absolute: 0.4 10*3/uL (ref 0.1–1.0)
Monocytes Relative: 7 %
Neutro Abs: 3.1 10*3/uL (ref 1.7–7.7)
Neutrophils Relative %: 54 %
Platelets: 210 10*3/uL (ref 150–400)
RBC: 3.86 MIL/uL — ABNORMAL LOW (ref 3.87–5.11)
RDW: 13.2 % (ref 11.5–15.5)
WBC: 5.8 10*3/uL (ref 4.0–10.5)
nRBC: 0 % (ref 0.0–0.2)

## 2018-10-01 LAB — ETHANOL: Alcohol, Ethyl (B): 338 mg/dL (ref <10)

## 2018-10-01 LAB — ACETAMINOPHEN LEVEL: Acetaminophen (Tylenol), Serum: <10 ug/mL — ABNORMAL LOW (ref 10–30)

## 2018-10-01 LAB — SALICYLATE LEVEL: Salicylate Lvl: <7 mg/dL (ref 2.8–30.0)

## 2018-10-01 MED ORDER — LORAZEPAM 2 MG/ML IJ SOLN
INTRAMUSCULAR | Status: AC
  Administered 2018-10-01: 17:00:00
  Filled 2018-10-01: qty 1

## 2018-10-01 MED ORDER — ADULT MULTIVITAMIN W/MINERALS CH
1.0000 | ORAL_TABLET | Freq: Every day | ORAL | Status: DC
  Administered 2018-10-02: 1 via ORAL
  Filled 2018-10-01: qty 1

## 2018-10-01 MED ORDER — THIAMINE HCL 100 MG/ML IJ SOLN: 100.0000 mg | Freq: Every day | INTRAMUSCULAR | Status: DC

## 2018-10-01 MED ORDER — VITAMIN B-1 100 MG PO TABS
100.0000 mg | ORAL_TABLET | Freq: Every day | ORAL | Status: DC
  Administered 2018-10-02: 100 mg via ORAL
  Filled 2018-10-01: qty 1

## 2018-10-01 MED ORDER — FOLIC ACID 1 MG PO TABS
1.0000 mg | ORAL_TABLET | Freq: Every day | ORAL | Status: DC
  Administered 2018-10-02: 1 mg via ORAL
  Filled 2018-10-01: qty 1

## 2018-10-01 MED ORDER — HALOPERIDOL LACTATE 5 MG/ML IJ SOLN
INTRAMUSCULAR | Status: AC
  Administered 2018-10-01: 17:00:00
  Filled 2018-10-01: qty 1

## 2018-10-01 MED ORDER — LORAZEPAM 1 MG PO TABS
1.0000 mg | ORAL_TABLET | Freq: Four times a day (QID) | ORAL | Status: DC | PRN
  Administered 2018-10-02: 1 mg via ORAL
  Filled 2018-10-01: qty 1

## 2018-10-01 MED ORDER — LORAZEPAM 2 MG/ML IJ SOLN: 1.0000 mg | Freq: Four times a day (QID) | INTRAMUSCULAR | Status: DC | PRN

## 2018-10-01 MED ORDER — TETANUS-DIPHTH-ACELL PERTUSSIS 5-2.5-18.5 LF-MCG/0.5 IM SUSP
0.5000 mL | Freq: Once | INTRAMUSCULAR | Status: AC
  Administered 2018-10-01: 0.5 mL via INTRAMUSCULAR
  Filled 2018-10-01: qty 0.5

## 2018-10-01 MED ORDER — DIPHENHYDRAMINE HCL 50 MG/ML IJ SOLN
INTRAMUSCULAR | Status: AC
  Administered 2018-10-01: 17:00:00
  Filled 2018-10-01: qty 1

## 2018-10-01 MED ORDER — LORAZEPAM 2 MG/ML IJ SOLN: 2.0000 mg | INTRAMUSCULAR | Status: DC | PRN

## 2018-10-01 NOTE — ED Notes (Signed)
IVC re consult tomorrrow by clapacs

## 2018-10-01 NOTE — ED Notes (Signed)
Unable to assess patient in bed with eyes closed. Respirations even and un labored. Will continue to monitor.

## 2018-10-01 NOTE — ED Notes (Signed)
Unable to complete a full assessment due to patient being sedated.  Pt currently resting, chest rising and falling, appears to be in no acute distress.

## 2018-10-01 NOTE — ED Notes (Signed)
Patient denies SI/HI/AVH. Patient asking about going home. This Clinical research associatewriter explained that she has been IVC'ed and is unable to leave until she see's psych. Patient understandable with plan. Patient [place] sober and she relapsed today. Patient denies withdraw symptoms. Will continue to monitor.

## 2018-10-01 NOTE — ED Triage Notes (Signed)
Pt arrives in custody of PD screaming profanities and uncooperative.

## 2018-10-01 NOTE — BH Assessment (Signed)
TTS unable to complete assessment due to pt receiving IM to ensure safety of self and others.  Will attempt to assess pt when she is awake, alert, and willing to participate in assessment.

## 2018-10-01 NOTE — ED Provider Notes (Addendum)
[facility] Emergency Department Provider Note  ____________________________________________  Time seen: Approximately 9:29 PM  I have reviewed the triage vital signs and the nursing notes.   HISTORY  Chief Complaint Altered Mental Status  Level 5 Caveat: Portions of the History and Physical including HPI and review of systems are unable to be completely obtained due to patient being a poor historian due to agitation and combativeness   HPI Terri Mann is a 42 y.o. female with a history of chronic back pain GERD and alcohol abuse who is brought to the ED under involuntary commitment by police who were called due to patient being suicidal and cutting herself.  Reportedly she is having a romantic conflict which caused her to become very upset.  Patient does not offer any history and is not cooperative at all.  She is only swearing at all the police and any staff members entering the room including myself, and threatening physical violence.      Past Medical History:  Diagnosis Date  . Asthma   . Chronic back pain   . GERD (gastroesophageal reflux disease)   . Pancreatitis   . Thyroid disease      Patient Active Problem List   Diagnosis Date Noted  . Alcohol abuse 10/01/2018  . Substance induced mood disorder (HCC) 10/01/2018  . Asthma 01/04/2018  . GERD (gastroesophageal reflux disease) 01/04/2018  . Hypothyroidism 01/04/2018  . Alcohol withdrawal seizure without complication (HCC) 01/04/2018  . Alcohol withdrawal seizure (HCC) 01/04/2018  . Acute pancreatitis 08/11/2016  . Pancreatic insufficiency 08/10/2016     Past Surgical History:  Procedure Laterality Date  . tubial ligation        Prior to Admission medications   Medication Sig Start Date End Date Taking? Authorizing Provider  amLODipine (NORVASC) 5 MG tablet Take 5 mg by mouth daily. 03/11/18   [provider]  ciprofloxacin (CIPRO) 500 MG tablet Take 1 tablet (500 mg  total) by mouth 2 (two) times daily. 05/13/18   Jene EveryKinner, Robert, MD  dicyclomine (BENTYL) 20 MG tablet Take 1 tablet (20 mg total) by mouth 3 (three) times daily as needed for spasms. Patient not taking: Reported on 08/25/2017 01/01/17 01/01/18  Myrna BlazerSchaevitz, David Matthew, MD  DULoxetine (CYMBALTA) 60 MG capsule Take 60 mg by mouth daily.    [provider]  FLOVENT HFA 220 MCG/ACT inhaler Inhale 1 puff into the lungs 2 (two) times daily.    [provider]  HYDROcodone-acetaminophen (NORCO/VICODIN) 5-325 MG tablet Take 1 tablet by mouth every 6 (six) hours as needed for severe pain. 05/13/18   Jene EveryKinner, Robert, MD  levothyroxine (SYNTHROID) 100 MCG tablet Take 1 tablet (100 mcg total) by mouth daily. 08/25/17 01/03/19  Dionne BucySiadecki, Sebastian, MD  loratadine (CLARITIN) 10 MG tablet Take 10 mg by mouth daily as needed for allergies.    [provider]  metroNIDAZOLE (FLAGYL) 500 MG tablet Take 1 tablet (500 mg total) by mouth 2 (two) times daily after a meal. 05/13/18   Jene EveryKinner, Robert, MD  pantoprazole (PROTONIX) 40 MG tablet Take 1 tablet (40 mg total) by mouth daily. Patient taking differently: Take 40 mg by mouth 2 (two) times daily as needed (Acid reflux).  12/30/16 05/14/19  Willy Eddyobinson, Patrick, MD  sucralfate (CARAFATE) 1 g tablet Take 1 tablet (1 g total) by mouth 4 (four) times daily -  with meals and at bedtime. 08/13/16   Altamese DillingVachhani, Vaibhavkumar, MD  Vitamin D, Ergocalciferol, (DRISDOL) 50000 units CAPS capsule Take 50,000  Units by mouth every 7 (seven) days. Tuesdays    [provider]     Allergies Patient has no known allergies.   Family History  Problem Relation Age of Onset  . Thyroid disease Mother   . Heart attack Father   . Thyroid disease Sister   . Liver disease Brother     Social History Social History   Tobacco Use  . Smoking status: Current Some Day Smoker    Packs/day: 2.00    Types: Cigarettes  . Smokeless tobacco: Never Used  Substance Use  Topics  . Alcohol use: Yes    Alcohol/week: 140.0 standard drinks    Types: 140 Shots of liquor per week  . Drug use: No    Review of Systems Level 5 Caveat: Portions of the History and Physical including HPI and review of systems are unable to be completely obtained due to patient being a poor historian due to combativeness  ____________________________________________   PHYSICAL EXAM:  VITAL SIGNS: ED Triage Vitals  Enc Vitals Group     BP --      Pulse --      Resp --      Temp --      Temp src --      SpO2 --      Weight 10/01/18 1800 165 lb (74.8 kg)     Height 10/01/18 1800 5\' 8"  (1.727 m)     Head Circumference --      Peak Flow --      Pain Score 10/01/18 1708 0     Pain Loc --      Pain Edu? --      Excl. in GC? --     Vital signs reviewed, nursing assessments reviewed. Exam mostly performed after the patient was calmed pharmacologically.  Constitutional:   Awake and alert.  Smells of alcohol. Eyes:   Conjunctivae are normal. EOMI. PERRL. ENT      Head:   Normocephalic and atraumatic.      Nose:   No congestion/rhinnorhea.       Mouth/Throat:   MMM, no pharyngeal erythema. No peritonsillar mass.       Neck:   No meningismus. Full ROM. Hematological/Lymphatic/Immunilogical:   No cervical lymphadenopathy. Cardiovascular:   RRR. Symmetric bilateral radial and DP pulses.  No murmurs. Cap refill less than 2 seconds. Respiratory:   Normal respiratory effort without tachypnea/retractions. Breath sounds are clear and equal bilaterally. No wheezes/rales/rhonchi. Gastrointestinal:   Soft and nontender. Non distended. There is no CVA tenderness.  No rebound, rigidity, or guarding.  Musculoskeletal:   Normal range of motion in all extremities. No joint effusions.  No lower extremity tenderness.  No edema. Neurologic:   Slurred speech.  Motor grossly intact. No acute focal neurologic deficits are appreciated.  Skin:    Skin is warm, dry with linear abrasion on the  volar right forearm and subcutaneous laceration on the left volar forearm in a longitudinal fashion.  Both hemostatic. No rash noted.  No petechiae, purpura, or bullae.  ____________________________________________    LABS (pertinent positives/negatives) (all labs ordered are listed, but only abnormal results are displayed) Labs Reviewed  ACETAMINOPHEN LEVEL - Abnormal; Notable for the following components:      Result Value   Acetaminophen (Tylenol), Serum <10 (*)    All other components within normal limits  COMPREHENSIVE METABOLIC PANEL - Abnormal; Notable for the following components:   Glucose, Bld 100 (*)    Calcium 8.7 (*)  Total Bilirubin 0.2 (*)    All other components within normal limits  ETHANOL - Abnormal; Notable for the following components:   Alcohol, Ethyl (B) 338 (*)    All other components within normal limits  CBC WITH DIFFERENTIAL/PLATELET - Abnormal; Notable for the following components:   RBC 3.86 (*)    MCV 100.5 (*)    All other components within normal limits  SALICYLATE LEVEL  PREGNANCY, URINE  URINE DRUG SCREEN, QUALITATIVE ([facility] ONLY)   ____________________________________________   EKG    ____________________________________________    RADIOLOGY  No results found.  ____________________________________________   PROCEDURES .Critical Care Performed by: Sharman Cheek, MD Authorized by: Sharman Cheek, MD   Critical care provider statement:    Critical care time (minutes):  35   Critical care time was exclusive of:  Separately billable procedures and treating other patients   Critical care was necessary to treat or prevent imminent or life-threatening deterioration of the following conditions:  Toxidrome and CNS failure or compromise   Critical care was time spent personally by me on the following activities:  Development of treatment plan with patient or surrogate, discussions with consultants, evaluation of patient's response  to treatment, examination of patient, obtaining history from patient or surrogate, ordering and performing treatments and interventions, ordering and review of laboratory studies, ordering and review of radiographic studies, pulse oximetry, re-evaluation of patient's condition and review of old charts Comments:     Patient severely agitated and combative requiring parenteral agents to calm her and provide a safe environment.    ____________________________________________    CLINICAL IMPRESSION / ASSESSMENT AND PLAN / ED COURSE  Pertinent labs & imaging results that were available during my care of the patient were reviewed by me and considered in my medical decision making (see chart for details).      Clinical Course as of Oct 01 2128  Thu Oct 01, 2018  1651 Patient brought to the ED under IVC by [facility].  On arrival she is hostile and combative, swearing and screaming, requiring multiple law enforcement and security personnel to restrain her.  Not able to engage her therapeutically currently.  Possible psychosis versus substance abuse.  To help calm her and treat her acute mental health disorder, she is given Ativan 2 mg IM, Haldol 5 mg IM, Benadryl 50 mill grams IM.  Psychiatric evaluation when able.   [PS]  1728 Patient now calm and somnolent.  And cuffs were removed by police.  No significant associated injuries.  Extremities are unremarkable although she does have a linear laceration on both volar forearms.  The right forearm has a 3 cm superficial laceration.  The left forearm has a 5 cm laceration into the subcutaneous space requiring Dermabond after wound care.  I will give her a tetanus shot while waiting for psychiatry evaluation.   [PS]  1853 Patient sleeping but arousable, calm and cooperative at this time.  Bilateral forearm wounds cleaned.  Confirm right forearm does not require laceration repair.  Laceration the left forearm was repaired with Dermabond.   [PS]    Clinical  Course User Index [PS] Sharman Cheek, MD     ____________________________________________   FINAL CLINICAL IMPRESSION(S) / ED DIAGNOSES    Final diagnoses:  Alcoholic intoxication without complication [facility])  Suicidal ideation     ED Discharge Orders    None      Portions of this note were generated with dragon dictation software. Dictation errors may occur despite best attempts at proofreading.  Sharman Cheek, MD 10/01/18 2133    Sharman Cheek, MD 10/01/18 2137

## 2018-10-01 NOTE — ED Notes (Signed)
Patient dressed out by this tech and RN [employer]. Black shirt, black bra, black shorts, multi- color socks,black shoes, pair of glasses, cell phone, pair diamond like earrings,3 blue and white beaded bracelets, blue and white beaded necklace,navel ring. All jewelry placed in sample cup, and placed in patients belonging bag.

## 2018-10-01 NOTE — Consult Note (Signed)
[facility]   Reason for Consult: Consult for this 42 year old woman with a history of alcohol abuse brought into the emergency room very agitated Referring Physician: Scotty CourtStafford Patient Identification: Terri Mann MRN:  [id number] Principal Diagnosis: Alcohol abuse Diagnosis:  Principal Problem:   Alcohol abuse Active Problems:   Substance induced mood disorder (HCC)   Total Time spent with patient: 30 minutes  Subjective:   Terri Mann is a 42 y.o. female patient admitted with "get out of here".  HPI: Patient interviewed although this was brief and did not give much information.  Chart reviewed.  42 year old woman with a history of alcohol abuse including alcohol withdrawal seizures who was brought here under involuntary commitment.  Paperwork just states that the patient has been agitated and talking about killing herself.  Patient was brought into the emergency room by law enforcement and was agitated belligerent and combative.  Was in handcuffs with several law enforcement officers standing by for safety when I saw her.  Patient appeared to be intoxicated.  Curnes to me several times.  Called me a lot of ugly names and told me she was going to kill me.  Refused any offers of any assistance.  Told me that any questions I had for her were "none of my business".  Social history: No information  Medical history: Patient has a past history of documented alcohol withdrawal seizures and pancreatitis as well as thyroid disease.  Substance abuse history: Established history of alcohol abuse.  No labs are back at this time not clear if any other drugs are involved or how intoxicated she is now.  Past Psychiatric History: Patient has primarily been treated on the medical service because of complicated alcohol withdrawal and seizures.  Has an established history of alcohol abuse.  Unknown if she is engaging in any kind of treatment.  Risk to Self:   Risk to Others:    Prior Inpatient Therapy:   Prior Outpatient Therapy:    Past Medical History:  Past Medical History:  Diagnosis Date  . Asthma   . Chronic back pain   . GERD (gastroesophageal reflux disease)   . Pancreatitis   . Thyroid disease     Past Surgical History:  Procedure Laterality Date  . tubial ligation      Family History:  Family History  Problem Relation Age of Onset  . Thyroid disease Mother   . Heart attack Father   . Thyroid disease Sister   . Liver disease Brother    Family Psychiatric  History: Unknown Social History:  Social History   Substance and Sexual Activity  Alcohol Use Yes  . Alcohol/week: 140.0 standard drinks  . Types: 140 Shots of liquor per week     Social History   Substance and Sexual Activity  Drug Use No    Social History   Socioeconomic History  . Marital status: Legally Separated    Spouse name: Not on file  . Number of children: Not on file  . Years of education: Not on file  . Highest education level: Not on file  Occupational History  . Not on file  Social Needs  . Financial resource strain: Not on file  . Food insecurity:    Worry: Not on file    Inability: Not on file  . Transportation needs:    Medical: Not on file    Non-medical: Not on file  Tobacco Use  . Smoking status: Current Some Day Smoker  Packs/day: 2.00    Types: Cigarettes  . Smokeless tobacco: Never Used  Substance and Sexual Activity  . Alcohol use: Yes    Alcohol/week: 140.0 standard drinks    Types: 140 Shots of liquor per week  . Drug use: No  . Sexual activity: Not on file  Lifestyle  . Physical activity:    Days per week: Not on file    Minutes per session: Not on file  . Stress: Not on file  Relationships  . Social connections:    Talks on phone: Not on file    Gets together: Not on file    Attends religious service: Not on file    Active member of club or organization: Not on file    Attends meetings of clubs or organizations: Not on  file    Relationship status: Not on file  Other Topics Concern  . Not on file  Social History Narrative  . Not on file   Additional Social History:    Allergies:  No Known Allergies  Labs: No results found for this or any previous visit (from the past 48 hour(s)).  Current Facility-Administered Medications  Medication Dose Route Frequency Provider Last Rate Last Dose  . folic acid (FOLVITE) tablet 1 mg  1 mg Oral Daily Rital Cavey T, MD      . LORazepam (ATIVAN) tablet 1 mg  1 mg Oral Q6H PRN Arvilla Salada, Jackquline Denmark, MD       Or  . LORazepam (ATIVAN) injection 1 mg  1 mg Intravenous Q6H PRN Kayleanna Lorman T, MD      . LORazepam (ATIVAN) injection 2 mg  2 mg Intramuscular Q2H PRN Donte Kary T, MD      . multivitamin with minerals tablet 1 tablet  1 tablet Oral Daily Enedina Pair T, MD      . thiamine (VITAMIN B-1) tablet 100 mg  100 mg Oral Daily Keeghan Mcintire T, MD       Or  . thiamine (B-1) injection 100 mg  100 mg Intravenous Daily Revel Stellmach, Jackquline Denmark, MD       Current Outpatient Medications  Medication Sig Dispense Refill  . amLODipine (NORVASC) 5 MG tablet Take 5 mg by mouth daily.  5  . ciprofloxacin (CIPRO) 500 MG tablet Take 1 tablet (500 mg total) by mouth 2 (two) times daily. 14 tablet 0  . dicyclomine (BENTYL) 20 MG tablet Take 1 tablet (20 mg total) by mouth 3 (three) times daily as needed for spasms. (Patient not taking: Reported on 08/25/2017) 30 tablet 0  . DULoxetine (CYMBALTA) 60 MG capsule Take 60 mg by mouth daily.    Marland Kitchen FLOVENT HFA 220 MCG/ACT inhaler Inhale 1 puff into the lungs 2 (two) times daily.    Marland Kitchen HYDROcodone-acetaminophen (NORCO/VICODIN) 5-325 MG tablet Take 1 tablet by mouth every 6 (six) hours as needed for severe pain. 12 tablet 0  . levothyroxine (SYNTHROID) 100 MCG tablet Take 1 tablet (100 mcg total) by mouth daily. 30 tablet 1  . loratadine (CLARITIN) 10 MG tablet Take 10 mg by mouth daily as needed for allergies.    . metroNIDAZOLE (FLAGYL) 500 MG  tablet Take 1 tablet (500 mg total) by mouth 2 (two) times daily after a meal. 14 tablet 0  . pantoprazole (PROTONIX) 40 MG tablet Take 1 tablet (40 mg total) by mouth daily. (Patient taking differently: Take 40 mg by mouth 2 (two) times daily as needed (Acid reflux). ) 30 tablet 1  . sucralfate (  CARAFATE) 1 g tablet Take 1 tablet (1 g total) by mouth 4 (four) times daily -  with meals and at bedtime. 90 tablet 1  . Vitamin D, Ergocalciferol, (DRISDOL) 50000 units CAPS capsule Take 50,000 Units by mouth every 7 (seven) days. Tuesdays      Musculoskeletal: Strength & Muscle Tone: decreased Gait & Station: normal Patient leans: N/A  Psychiatric Specialty Exam: Physical Exam  Nursing note and vitals reviewed. Constitutional: She appears well-developed and well-nourished.  HENT:  Head: Normocephalic and atraumatic.  Eyes: Pupils are equal, round, and reactive to light. Conjunctivae are normal.  Neck: Normal range of motion.  Cardiovascular: Regular rhythm and normal heart sounds.  Respiratory: Effort normal. No respiratory distress.  GI: Soft.  Musculoskeletal: Normal range of motion.  Neurological: She is alert.  Skin: Skin is warm and dry.     Psychiatric: Her affect is labile and inappropriate. She is agitated, aggressive and combative. Cognition and memory are impaired. She expresses impulsivity and inappropriate judgment. She is noncommunicative.    Review of Systems  Unable to perform ROS: Psychiatric disorder    There were no vitals taken for this visit.There is no height or weight on file to calculate BMI.  General Appearance: Disheveled  Eye Contact:  Minimal  Speech:  Garbled and Slurred  Volume:  Decreased  Mood:  Angry and Irritable  Affect:  Inappropriate  Thought Process:  Disorganized  Orientation:  Negative  Thought Content:  Paranoid Ideation and Tangential  Suicidal Thoughts:  Paperwork alleges she had made suicidal statements but the patient did not answer any  questions at this time  Homicidal Thoughts:  Patient has been aggressive and combative but it is not clear that she actually wants to hurt anyone.  She told me that she came to the hospital "to kill you" but she was obviously being facetious.  Memory:  Immediate;   Fair Recent;   Poor Remote;   Poor  Judgement:  Impaired  Insight:  Lacking  Psychomotor Activity:  Restlessness  Concentration:  Concentration: Poor  Recall:  Poor  Fund of Knowledge:  Poor  Language:  Poor  Akathisia:  No  Handed:  Right  AIMS (if indicated):     Assets:  Communication Skills  ADL's:  Impaired  Cognition:  Impaired,  Mild  Sleep:        Treatment Plan Summary: Daily contact with patient to assess and evaluate symptoms and progress in treatment, Medication management and Plan Patient has been given Ativan Haldol and Benadryl already in the emergency room for agitation.  I have put in orders for as needed Ativan intramuscular and suggested to nursing that if the patient is not calm enough to allow her to come out of the handcuffs within 15 minutes of the last injection that she should be given another 2 mg of Ativan.  Additionally I have put in orders for the regular detox protocol.  Patient does have a history of recurrent seizures from alcohol withdrawal and would probably be much better to get ahead of this by giving her enough medicine to sedate her rather than waiting for her to have a seizure.  Patient obviously is in no condition to be admitted to the psychiatric ward at this point.  I will continue to follow up as needed.  Disposition: Supportive therapy provided about ongoing stressors.  Mordecai Rasmussen, MD 10/01/2018 5:11 PM

## 2018-10-02 LAB — URINE DRUG SCREEN, QUALITATIVE (ARMC ONLY)
Amphetamines, Ur Screen: NOT DETECTED
BARBITURATES, UR SCREEN: NOT DETECTED
Benzodiazepine, Ur Scrn: POSITIVE — AB
CANNABINOID 50 NG, UR ~~LOC~~: NOT DETECTED
COCAINE METABOLITE, UR ~~LOC~~: NOT DETECTED
MDMA (ECSTASY) UR SCREEN: NOT DETECTED
Methadone Scn, Ur: NOT DETECTED
Opiate, Ur Screen: NOT DETECTED
PHENCYCLIDINE (PCP) UR S: NOT DETECTED
Tricyclic, Ur Screen: POSITIVE — AB

## 2018-10-02 LAB — PREGNANCY, URINE: Preg Test, Ur: NEGATIVE

## 2018-10-02 MED ORDER — NICOTINE 21 MG/24HR TD PT24
21.0000 mg | MEDICATED_PATCH | Freq: Once | TRANSDERMAL | Status: DC
  Administered 2018-10-02: 21 mg via TRANSDERMAL
  Filled 2018-10-02: qty 1

## 2018-10-02 NOTE — ED Notes (Signed)
BEHAVIORAL HEALTH ROUNDING Patient sleeping: Yes.   Patient alert and oriented: not applicable SLEEPING Behavior appropriate: Yes.  ; If no, describe: SLEEPING Nutrition and fluids offered: No SLEEPING Toileting and hygiene offered: NoSLEEPING Sitter present: not applicable, Q 15 min safety rounds and observation via security camera. Law enforcement present: Yes ODS 

## 2018-10-02 NOTE — ED Notes (Signed)
Pt encouraged to take PO fluids as her heart rate is still slightly elevated. Pt verbalizes understanding. Water given.

## 2018-10-02 NOTE — ED Notes (Signed)
Pt brought into ED BHU via sally port and wand with metal detector for safety by ODS officer. Patient oriented to unit/care area: Pt informed of unit policies and procedures.  Informed that, for their safety, care areas are designed for safety and monitored by security cameras at all times; and visiting hours explained to patient. Patient verbalizes understanding, and verbal contract for safety obtained.Pt shown to their room.  

## 2018-10-02 NOTE — BH Assessment (Signed)
Assessment Note  Terri Mann is an 42 y.o. female who presents to the ER, due to cutting her wrist while intoxicated. Upon arrival to the ER, patient was agitated an irritable and her BAC was 338.  When she spoke with this Clinical research associate, she was pleasant and cooperative. Patient states, she and her boyfriend had an argument and it resulted in him walking out the house. After he left the patient cut her wrist with a knife. She states, she was mad at the boyfriend and upset she relapsed of after she was sober for 90 days.  Throughout the interview the patient denied SI/HI and AV/H. She states, she's wanting to leave the ER because her daughter turned 41 years old today. The patient and her family made plans to celebrate. Patient also provided protector factors, that would prevent her from ending her life. "First, I don't want to kill myself. I don't want to die. I have two sons and a daughter. I won't leave them and I don't want to die..."  Patient states, her sister called Patent examiner. They live in close proximity of each other. Each day when she get off from work, "she stop by" the patient's home to check on her. When she saw the patient bleeding she went to her home and called 911.  Diagnosis: Alcohol Use Disorder  Past Medical History:  Past Medical History:  Diagnosis Date  . Asthma   . Chronic back pain   . GERD (gastroesophageal reflux disease)   . Pancreatitis   . Thyroid disease     Past Surgical History:  Procedure Laterality Date  . tubial ligation       Family History:  Family History  Problem Relation Age of Onset  . Thyroid disease Mother   . Heart attack Father   . Thyroid disease Sister   . Liver disease Brother     Social History:  reports that she has been smoking cigarettes. She has been smoking about 2.00 packs per day. She has never used smokeless tobacco. She reports that she drinks about 140.0 standard drinks of alcohol per week. She reports that she does not  use drugs.  Additional Social History:  Alcohol / Drug Use Pain Medications: See PTA Prescriptions: See PTA Over the Counter: See PTA History of alcohol / drug use?: Yes Longest period of sobriety (when/how long): 90 days Negative Consequences of Use: Personal relationships Withdrawal Symptoms: (Reports of none) Substance #1 Name of Substance 1: Alcohol 1 - Amount (size/oz): "A fifth" 1 - Last Use / Amount: 10/01/2018  CIWA: CIWA-Ar BP: 137/82 Pulse Rate: (!) 105 Nausea and Vomiting: no nausea and no vomiting Tactile Disturbances: none Tremor: not visible, but can be felt fingertip to fingertip Auditory Disturbances: not present Paroxysmal Sweats: barely perceptible sweating, palms moist Visual Disturbances: not present Anxiety: moderately anxious, or guarded, so anxiety is inferred Headache, Fullness in Head: none present Agitation: normal activity Orientation and Clouding of Sensorium: oriented and can do serial additions CIWA-Ar Total: 6 COWS:    Allergies: No Known Allergies  Home Medications:  (Not in a hospital admission)  OB/GYN Status:  No LMP recorded.  General Assessment Data Location of Assessment: [facility] ED TTS Assessment: In system Is this a Tele or Face-to-Face Assessment?: Face-to-Face Is this an Initial Assessment or a Re-assessment for this encounter?: Initial Assessment Patient Accompanied by:: N/A Language Other than English: No Living Arrangements: Other (Comment)(Private Home) What gender do you identify as?: Female Marital status: Single Pregnancy Status: No  Living Arrangements: Children(Three children live in the home) Can pt return to current living arrangement?: Yes Admission Status: Involuntary Petitioner: ED Attending Is patient capable of signing voluntary admission?: No(Under IVC) Referral Source: Self/Family/Friend Insurance type: Medicaid  Medical Screening Exam [facility] Walk-in ONLY) Medical Exam completed: Yes  Crisis Care  Plan Living Arrangements: Children(Three children live in the home) Name of Psychiatrist: Reports of none Name of Therapist: Reports of none  Education Status Is patient currently in school?: No Is the patient employed, unemployed or receiving disability?: Unemployed  Risk to self with the past 6 months Suicidal Ideation: No Has patient been a risk to self within the past 6 months prior to admission? : No Suicidal Intent: No Has patient had any suicidal intent within the past 6 months prior to admission? : No Is patient at risk for suicide?: No Suicidal Plan?: No Has patient had any suicidal plan within the past 6 months prior to admission? : No Access to Means: No What has been your use of drugs/alcohol within the last 12 months?: Alcohol Previous Attempts/Gestures: No Other Self Harm Risks: Alcohol Use Triggers for Past Attempts: None known Intentional Self Injurious Behavior: None Family Suicide History: No Recent stressful life event(s): Other (Comment) Persecutory voices/beliefs?: No Depression: Yes Depression Symptoms: Guilt, Feeling worthless/self pity Substance abuse history and/or treatment for substance abuse?: Yes Suicide prevention information given to non-admitted patients: Not applicable  Risk to Others within the past 6 months Homicidal Ideation: No Does patient have any lifetime risk of violence toward others beyond the six months prior to admission? : No Thoughts of Harm to Others: No Current Homicidal Intent: No Current Homicidal Plan: No Access to Homicidal Means: No Identified Victim: Reports of none History of harm to others?: No Assessment of Violence: None Noted Violent Behavior Description: Reports of none Does patient have access to weapons?: No Criminal Charges Pending?: No Does patient have a court date: No Is patient on probation?: No  Psychosis Hallucinations: None noted Delusions: None noted  Mental Status Report Appearance/Hygiene:  Unremarkable, In scrubs Eye Contact: Good Motor Activity: Freedom of movement, Unremarkable Speech: Logical/coherent Level of Consciousness: Alert Mood: Pleasant Affect: Appropriate to circumstance Anxiety Level: Minimal Thought Processes: Coherent, Relevant Judgement: Unimpaired Orientation: Person, Place, Time, Situation, Appropriate for developmental age Obsessive Compulsive Thoughts/Behaviors: Minimal  Cognitive Functioning Concentration: Normal Memory: Recent Intact, Remote Intact Is patient IDD: No Insight: Fair Impulse Control: Fair Appetite: Good Have you had any weight changes? : No Change Sleep: No Change Total Hours of Sleep: 8 Vegetative Symptoms: None  ADLScreening [facility] Assessment Services) Patient's cognitive ability adequate to safely complete daily activities?: Yes Patient able to express need for assistance with ADLs?: Yes Independently performs ADLs?: Yes (appropriate for developmental age)  Prior Inpatient Therapy Prior Inpatient Therapy: Yes Prior Therapy Dates: 06/2018 [facility] Reason for Treatment: Alcohol Detox  Prior Outpatient Therapy Prior Outpatient Therapy: No Does patient have an ACCT team?: No Does patient have Intensive In-House Services?  : No Does patient have [facility] services? : No Does patient have P4CC services?: No  ADL Screening (condition at time of admission) Patient's cognitive ability adequate to safely complete daily activities?: Yes Is the patient deaf or have difficulty hearing?: No Does the patient have difficulty seeing, even when wearing glasses/contacts?: No Does the patient have difficulty concentrating, remembering, or making decisions?: No Patient able to express need for assistance with ADLs?: Yes Does the patient have difficulty dressing or bathing?: No Independently performs ADLs?:  Yes (appropriate for developmental age) Does the patient have difficulty walking or climbing  stairs?: No Weakness of Legs: None Weakness of Arms/Hands: None  Home Assistive Devices/Equipment Home Assistive Devices/Equipment: None  Therapy Consults (therapy consults require a physician order) PT Evaluation Needed: No OT Evalulation Needed: No SLP Evaluation Needed: No Abuse/Neglect Assessment (Assessment to be complete while patient is alone) Abuse/Neglect Assessment Can Be Completed: Yes Physical Abuse: Denies Verbal Abuse: Denies Sexual Abuse: Denies Exploitation of patient/patient's resources: Denies Self-Neglect: Denies Values / Beliefs Cultural Requests During Hospitalization: None Spiritual Requests During Hospitalization: None Consults Spiritual Care Consult Needed: No Social Work Consult Needed: No        Child/Adolescent Assessment Running Away Risk: Denies(Patient is an adult)  Disposition:  Disposition Initial Assessment Completed for this Encounter: Yes  On Site Evaluation by:   Reviewed with Physician:    Lilyan Gilford MS, LCAS, LPC, NCC, CCSI Therapeutic Triage Specialist 10/02/2018 12:11 PM

## 2018-10-02 NOTE — ED Notes (Signed)
ENVIRONMENTAL ASSESSMENT  Potentially harmful objects out of patient reach: Yes.  Personal belongings secured: Yes.  Patient dressed in hospital provided attire only: Yes.  Plastic bags out of patient reach: Yes.  Patient care equipment (cords, cables, call bells, lines, and drains) shortened, removed, or accounted for: Yes.  Equipment and supplies removed from bottom of stretcher: Yes.  Potentially toxic materials out of patient reach: Yes.  Sharps container removed or out of patient reach: Yes.   BEHAVIORAL HEALTH ROUNDING  Patient sleeping: No.  Patient alert and oriented: yes  Behavior appropriate: Yes. ; If no, describe:  Nutrition and fluids offered: Yes  Toileting and hygiene offered: Yes  Sitter present: not applicable, Q 15 min safety rounds and observation via security camera. Law enforcement present: Yes ODS  ED BHU PLACEMENT JUSTIFICATION  Is the patient under IVC or is there intent for IVC: Yes.  Is the patient medically cleared: Yes.  Is there vacancy in the ED BHU: Yes.  Is the population mix appropriate for patient: Yes.  Is the patient awaiting placement in inpatient or outpatient setting: Yes.  Has the patient had a psychiatric consult: No pending.  Survey of unit performed for contraband, proper placement and condition of furniture, tampering with fixtures in bathroom, shower, and each patient room: Yes. ; Findings: All clear  APPEARANCE/BEHAVIOR  calm, cooperative and adequate rapport can be established  NEURO ASSESSMENT  Orientation: time, place and person  Hallucinations: No.None noted (Hallucinations)  Speech: Normal  Gait: normal  RESPIRATORY ASSESSMENT  WNL  CARDIOVASCULAR ASSESSMENT  WNL  GASTROINTESTINAL ASSESSMENT  WNL  EXTREMITIES  WNL  PLAN OF CARE  Provide calm/safe environment. Vital signs assessed TID. ED BHU Assessment once each 12-hour shift. Collaborate with TTS daily or as condition indicates. Assure the ED provider has rounded once each  shift. Provide and encourage hygiene. Provide redirection as needed. Assess for escalating behavior; address immediately and inform ED provider.  Assess family dynamic and appropriateness for visitation as needed: Yes. ; If necessary, describe findings:  Educate the patient/family about BHU procedures/visitation: Yes. ; If necessary, describe findings: Pt is calm and cooperative at this time. Pt understanding and accepting of unit procedures/rules. Will continue to monitor with Q 15 min safety rounds and observation via security camera.

## 2018-10-02 NOTE — ED Notes (Signed)
BEHAVIORAL HEALTH ROUNDING  Patient sleeping: No.  Patient alert and oriented: yes  Behavior appropriate: Yes. ; If no, describe:  Nutrition and fluids offered: Yes  Toileting and hygiene offered: Yes  Sitter present: not applicable, Q 15 min safety rounds and observation via security camera. Law enforcement present: Yes ODS  

## 2018-10-02 NOTE — ED Provider Notes (Signed)
Patient has been cleared by psychiatry for discharge.   Emily FilbertWilliams, Jonathan E, MD 10/02/18 [phone]

## 2018-10-02 NOTE — ED Notes (Signed)
Hourly rounding reveals patient sleeping in room. No complaints, stable, in no acute distress. Q15 minute rounds and monitoring via Security Cameras to continue. 

## 2018-10-02 NOTE — Consult Note (Signed)
[facility] Consult   Reason for Consult: Follow-up for consult on this 42 year old woman with alcohol abuse Referring Physician: Jimmye Norman Patient Identification: Terri Mann MRN:  [id number] Principal Diagnosis: Alcohol abuse Diagnosis:  Principal Problem:   Alcohol abuse Active Problems:   Substance induced mood disorder ([place])   Total Time spent with patient: 30 minutes  Subjective:   Terri Mann is a 42 y.o. female patient admitted with "I was just upset".  HPI: Patient seen chart reviewed.  Patient came into the hospital the previous night extraordinarily intoxicated agitated and violent.  Required medication for sedation.  Slept through the night.  On reevaluation today she is now sobered up.  Has minimal memory of what happened yesterday.  Says that she had gotten in an argument with her boyfriend over some trivial thing and had been drinking heavily.  This was her first alcohol she claims in 90 days.  She just relapsed and consumed nearly a bottle of liquor.  Cut herself superficially on the arm.  Patient states prior to this she had not been depressed.  Denies symptoms of depression denies psychosis denies other drug use.  Patient today is awake alert oriented and shows appropriate affect.  Denies any suicidal or homicidal ideation.  Past Psychiatric History: Long history of alcohol abuse  Risk to Self: Suicidal Ideation: No Suicidal Intent: No Is patient at risk for suicide?: No Suicidal Plan?: No Access to Means: No What has been your use of drugs/alcohol within the last 12 months?: Alcohol Other Self Harm Risks: Alcohol Use Triggers for Past Attempts: None known Intentional Self Injurious Behavior: None Risk to Others: Homicidal Ideation: No Thoughts of Harm to Others: No Current Homicidal Intent: No Current Homicidal Plan: No Access to Homicidal Means: No Identified Victim: Reports of none History of harm to others?: No Assessment of Violence:  None Noted Violent Behavior Description: Reports of none Does patient have access to weapons?: No Criminal Charges Pending?: No Does patient have a court date: No Prior Inpatient Therapy: Prior Inpatient Therapy: Yes Prior Therapy Dates: 06/2018 Prior Therapy Facilty/Provider(s): [facility] Reason for Treatment: Alcohol Detox Prior Outpatient Therapy: Prior Outpatient Therapy: No Does patient have an ACCT team?: No Does patient have Intensive In-House Services?  : No Does patient have [facility] services? : No Does patient have P4CC services?: No  Past Medical History:  Past Medical History:  Diagnosis Date  . Asthma   . Chronic back pain   . GERD (gastroesophageal reflux disease)   . Pancreatitis   . Thyroid disease     Past Surgical History:  Procedure Laterality Date  . tubial ligation      Family History:  Family History  Problem Relation Age of Onset  . Thyroid disease Mother   . Heart attack Father   . Thyroid disease Sister   . Liver disease Brother    Family Psychiatric  History: See previous note Social History:  Social History   Substance and Sexual Activity  Alcohol Use Yes  . Alcohol/week: 140.0 standard drinks  . Types: 140 Shots of liquor per week     Social History   Substance and Sexual Activity  Drug Use No    Social History   Socioeconomic History  . Marital status: Legally Separated    Spouse name: Not on file  . Number of children: Not on file  . Years of education: Not on file  . Highest education level: Not on file  Occupational History  .  Not on file  Social Needs  . Financial resource strain: Not on file  . Food insecurity:    Worry: Not on file    Inability: Not on file  . Transportation needs:    Medical: Not on file    Non-medical: Not on file  Tobacco Use  . Smoking status: Current Some Day Smoker    Packs/day: 2.00    Types: Cigarettes  . Smokeless tobacco: Never Used  Substance and Sexual Activity  . Alcohol use: Yes     Alcohol/week: 140.0 standard drinks    Types: 140 Shots of liquor per week  . Drug use: No  . Sexual activity: Not on file  Lifestyle  . Physical activity:    Days per week: Not on file    Minutes per session: Not on file  . Stress: Not on file  Relationships  . Social connections:    Talks on phone: Not on file    Gets together: Not on file    Attends religious service: Not on file    Active member of club or organization: Not on file    Attends meetings of clubs or organizations: Not on file    Relationship status: Not on file  Other Topics Concern  . Not on file  Social History Narrative  . Not on file   Additional Social History:    Allergies:  No Known Allergies  Labs:  Results for orders placed or performed during the hospital encounter of 10/01/18 (from the past 48 hour(s))  Acetaminophen level     Status: Abnormal   Collection Time: 10/01/18  5:39 PM  Result Value Ref Range   Acetaminophen (Tylenol), Serum <10 (L) 10 - 30 ug/mL    Comment: (NOTE) Therapeutic concentrations vary significantly. A range of 10-30 ug/mL  may be an effective concentration for many patients. However, some  are best treated at concentrations outside of this range. Acetaminophen concentrations >150 ug/mL at 4 hours after ingestion  and >50 ug/mL at 12 hours after ingestion are often associated with  toxic reactions. Performed at [facility], [address] [place], [place] [address]   Comprehensive metabolic panel     Status: Abnormal   Collection Time: 10/01/18  5:39 PM  Result Value Ref Range   Sodium 145 135 - 145 mmol/L   Potassium 3.5 3.5 - 5.1 mmol/L   Chloride 110 98 - 111 mmol/L   CO2 23 22 - 32 mmol/L   Glucose, Bld 100 (H) 70 - 99 mg/dL   BUN 19 6 - 20 mg/dL   Creatinine, Ser 0.56 0.44 - 1.00 mg/dL   Calcium 8.7 (L) 8.9 - 10.3 mg/dL   Total Protein 7.2 6.5 - 8.1 g/dL   Albumin 4.1 3.5 - 5.0 g/dL   AST 24 15 - 41 U/L   ALT 13 0 - 44 U/L   Alkaline  Phosphatase 67 38 - 126 U/L   Total Bilirubin 0.2 (L) 0.3 - 1.2 mg/dL   GFR calc non Af Amer >60 >60 mL/min   GFR calc Af Amer >60 >60 mL/min    Comment: (NOTE) The eGFR has been calculated using the CKD EPI equation. This calculation has not been validated in all clinical situations. eGFR's persistently <60 mL/min signify possible Chronic Kidney Disease.    Anion gap 12 5 - 15    Comment: Performed at [facility], [address] [place], [place] [address]  Ethanol     Status: Abnormal  Collection Time: 10/01/18  5:39 PM  Result Value Ref Range   Alcohol, Ethyl (B) 338 (HH) <10 mg/dL    Comment: CRITICAL RESULT CALLED TO, READ BACK BY AND VERIFIED WITH RHEA MITCHELL 10/01/18 @ [facility] (NOTE) Lowest detectable limit for serum alcohol is 10 mg/dL. For medical purposes only. Performed at [facility], [address] [place], [place] [address]   Salicylate level     Status: None   Collection Time: 10/01/18  5:39 PM  Result Value Ref Range   Salicylate Lvl <1.9 2.8 - 30.0 mg/dL    Comment: Performed at [facility], [address] [place], [place] [address]  CBC with Differential     Status: Abnormal   Collection Time: 10/01/18  5:39 PM  Result Value Ref Range   WBC 5.8 4.0 - 10.5 K/uL   RBC 3.86 (L) 3.87 - 5.11 MIL/uL   Hemoglobin 12.8 12.0 - 15.0 g/dL   HCT 38.8 36.0 - 46.0 %   MCV 100.5 (H) 80.0 - 100.0 fL   MCH 33.2 26.0 - 34.0 pg   MCHC 33.0 30.0 - 36.0 g/dL   RDW 13.2 11.5 - 15.5 %   Platelets 210 150 - 400 K/uL   nRBC 0.0 0.0 - 0.2 %   Neutrophils Relative % 54 %   Neutro Abs 3.1 1.7 - 7.7 K/uL   Lymphocytes Relative 39 %   Lymphs Abs 2.3 0.7 - 4.0 K/uL   Monocytes Relative 7 %   Monocytes Absolute 0.4 0.1 - 1.0 K/uL   Eosinophils Relative 0 %   Eosinophils Absolute 0.0 0.0 - 0.5 K/uL   Basophils Relative 0 %   Basophils Absolute 0.0 0.0 - 0.1 K/uL   Immature Granulocytes 0 %   Abs Immature Granulocytes 0.02 0.00 - 0.07 K/uL     Comment: Performed at [facility], [address] [place], [place] [address]  Pregnancy, urine     Status: None   Collection Time: 10/02/18  5:16 AM  Result Value Ref Range   Preg Test, Ur NEGATIVE NEGATIVE    Comment: Performed at [facility], [address], [place], [place] [address]  Urine Drug Screen, Qualitative     Status: Abnormal   Collection Time: 10/02/18  5:16 AM  Result Value Ref Range   Tricyclic, Ur Screen POSITIVE (A) NONE DETECTED   Amphetamines, Ur Screen NONE DETECTED NONE DETECTED   MDMA (Ecstasy)Ur Screen NONE DETECTED NONE DETECTED   Cocaine Metabolite,Ur [place] NONE DETECTED NONE DETECTED   Opiate, Ur Screen NONE DETECTED NONE DETECTED   Phencyclidine (PCP) Ur S NONE DETECTED NONE DETECTED   Cannabinoid 50 Ng, Ur [place] NONE DETECTED NONE DETECTED   Barbiturates, Ur Screen NONE DETECTED NONE DETECTED   Benzodiazepine, Ur Scrn POSITIVE (A) NONE DETECTED   Methadone Scn, Ur NONE DETECTED NONE DETECTED    Comment: (NOTE) Tricyclics + metabolites, urine    Cutoff 1000 ng/mL Amphetamines + metabolites, urine  Cutoff 1000 ng/mL MDMA (Ecstasy), urine              Cutoff 500 ng/mL Cocaine Metabolite, urine          Cutoff 300 ng/mL Opiate + metabolites, urine        Cutoff 300 ng/mL Phencyclidine (PCP), urine         Cutoff 25 ng/mL Cannabinoid, urine                 Cutoff 50 ng/mL Barbiturates + metabolites,  urine  Cutoff 200 ng/mL Benzodiazepine, urine              Cutoff 200 ng/mL Methadone, urine                   Cutoff 300 ng/mL The urine drug screen provides only a preliminary, unconfirmed analytical test result and should not be used for non-medical purposes. Clinical consideration and professional judgment should be applied to any positive drug screen result due to possible interfering substances. A more specific alternate chemical method must be used in order to obtain a confirmed analytical result. Gas chromatography / mass  spectrometry (GC/MS) is the preferred confirmat ory method. Performed at [facility], [address] [place], [place] [address]     No current facility-administered medications for this encounter.    Current Outpatient Medications  Medication Sig Dispense Refill  . amLODipine (NORVASC) 5 MG tablet Take 5 mg by mouth daily.  5  . cyclobenzaprine (FLEXERIL) 10 MG tablet Take 10 mg by mouth 3 (three) times daily.  5  . FLOVENT HFA 220 MCG/ACT inhaler Inhale 1 puff into the lungs 2 (two) times daily.    [place] Kitchen FLUoxetine (PROZAC) 10 MG capsule Take 10 mg by mouth daily.   2  . hydrOXYzine (ATARAX/VISTARIL) 25 MG tablet Take 25 mg by mouth 3 (three) times daily as needed.  5  . levothyroxine (SYNTHROID) 100 MCG tablet Take 1 tablet (100 mcg total) by mouth daily. 30 tablet 1  . loratadine (CLARITIN) 10 MG tablet Take 10 mg by mouth daily as needed for allergies.    . meloxicam (MOBIC) 7.5 MG tablet Take 7.5 mg by mouth 2 (two) times daily.  1  . naltrexone (DEPADE) 50 MG tablet Take 50 mg by mouth daily.  1  . pantoprazole (PROTONIX) 40 MG tablet Take 1 tablet (40 mg total) by mouth daily. (Patient taking differently: Take 40 mg by mouth 2 (two) times daily as needed (Acid reflux). ) 30 tablet 1  . STIOLTO RESPIMAT 2.5-2.5 MCG/ACT AERS 2 INH INHALATION EVERY DAY  5  . Vitamin D, Ergocalciferol, (DRISDOL) 50000 units CAPS capsule Take 50,000 Units by mouth every 7 (seven) days. Tuesdays      Musculoskeletal: Strength & Muscle Tone: within normal limits Gait & Station: normal Patient leans: N/A  Psychiatric Specialty Exam: Physical Exam  Nursing note and vitals reviewed. Constitutional: She appears well-developed and well-nourished.  HENT:  Head: Normocephalic and atraumatic.  Eyes: Pupils are equal, round, and reactive to light. Conjunctivae are normal.  Neck: Normal range of motion.  Cardiovascular: Regular rhythm and normal heart sounds.  Respiratory: Effort normal. No  respiratory distress.  GI: Soft.  Musculoskeletal: Normal range of motion.  Neurological: She is alert.  Skin: Skin is warm and dry.  Psychiatric: Her speech is normal and behavior is normal. Judgment and thought content normal. Her mood appears anxious. She exhibits abnormal recent memory.    Review of Systems  Constitutional: Negative.   HENT: Negative.   Eyes: Negative.   Respiratory: Negative.   Cardiovascular: Negative.   Gastrointestinal: Negative.   Musculoskeletal: Negative.   Skin: Negative.   Neurological: Negative.   Psychiatric/Behavioral: Positive for substance abuse.    Blood pressure 137/82, pulse (!) 105, temperature 97.9 F (36.6 C), temperature source Oral, resp. rate 18, height '5\' 8"'  (1.727 m), weight 74.8 kg, SpO2 100 %.Body mass index is 25.09 kg/m.  General Appearance: Casual  Eye Contact:  Fair  Speech:  Clear  and Coherent  Volume:  Normal  Mood:  Euthymic  Affect:  Congruent  Thought Process:  Goal Directed  Orientation:  Full (Time, Place, and Person)  Thought Content:  Logical  Suicidal Thoughts:  No  Homicidal Thoughts:  No  Memory:  Immediate;   Fair Recent;   Fair Remote;   Fair  Judgement:  Fair  Insight:  Fair  Psychomotor Activity:  Decreased  Concentration:  Concentration: Fair  Recall:  [employer] of Knowledge:  Fair  Language:  Fair  Akathisia:  No  Handed:  Right  AIMS (if indicated):     Assets:  Desire for Improvement Housing  ADL's:  Intact  Cognition:  WNL  Sleep:        Treatment Plan Summary: Plan Patient is now sober.  She is alert and oriented.  Behaving rationally.  Not psychotic.  Patient shows reasonable insight.  No need for further hospitalization.  No longer meets commitment criteria.  Patient can be discharged from the emergency room and will follow-up in the community with continued AA work.  Disposition: No evidence of imminent risk to self or others at present.   Patient does not meet criteria for  psychiatric inpatient admission. Supportive therapy provided about ongoing stressors.  Alethia Berthold, MD 10/02/2018 7:34 PM

## 2018-10-02 NOTE — ED Notes (Signed)
Patient discharged home, patient received discharge papers. Patient received belongings and verbalized she has received all of her belongings. Patient appropriate and cooperative, Denies SI/HI AVH. Vital signs taken. NAD noted. 

## 2018-10-02 NOTE — ED Notes (Signed)
Patient talking to daughter

## 2018-10-02 NOTE — ED Notes (Signed)
Patient sitting in dayroom on phone at this time.

## 2019-03-29 ENCOUNTER — Ambulatory Visit
Admit: 2019-03-29 | Discharge: 2019-04-01 | Disposition: A | Discharge status: Home / Self Care | Payer: MEDICAID | Admitting: "Endocrinology

## 2019-04-01 MED ORDER — CHLORDIAZEPOXIDE 25 MG CAPSULE
ORAL_CAPSULE | ORAL | 0 refills | 0.00000 days | Status: CP
Start: 2019-04-01 — End: 2019-07-09

## 2019-04-01 MED ORDER — CHLORDIAZEPOXIDE 25 MG CAPSULE: capsule | 0 refills | 0 days | Status: AC

## 2019-04-01 MED ORDER — CHLORDIAZEPOXIDE 25 MG CAPSULE: capsule | 0 refills | 0 days

## 2019-07-09 ENCOUNTER — Ambulatory Visit
Admit: 2019-07-09 | Discharge: 2019-07-11 | Disposition: A | Discharge status: Home / Self Care | Payer: MEDICAID | Admitting: Internal Medicine

## 2019-07-11 MED ORDER — ONDANSETRON 4 MG DISINTEGRATING TABLET
ORAL_TABLET | Freq: Two times a day (BID) | ORAL | 0 refills | 5 days | Status: CP | PRN
Start: 2019-07-11 — End: ?

## 2019-07-11 MED ORDER — MAGNESIUM OXIDE 400 MG (241.3 MG MAGNESIUM) TABLET
ORAL_TABLET | Freq: Two times a day (BID) | ORAL | 0 refills | 30 days | Status: CP
Start: 2019-07-11 — End: ?

## 2019-07-11 MED ORDER — CHLORDIAZEPOXIDE 25 MG CAPSULE
ORAL_CAPSULE | 0 refills | 0 days | Status: CP
Start: 2019-07-11 — End: ?

## 2019-07-11 MED ORDER — THIAMINE HCL (VITAMIN B1) 100 MG TABLET
ORAL_TABLET | Freq: Every day | ORAL | 11 refills | 30 days | Status: CP
Start: 2019-07-11 — End: 2020-07-10

## 2019-07-12 MED ORDER — NICOTINE 14 MG/24 HR DAILY TRANSDERMAL PATCH
MEDICATED_PATCH | Freq: Every day | TRANSDERMAL | 1 refills | 28.00000 days | Status: CP
Start: 2019-07-12 — End: ?

## 2019-07-12 MED ORDER — NICOTINE 21 MG/24 HR DAILY TRANSDERMAL PATCH
MEDICATED_PATCH | Freq: Every day | TRANSDERMAL | 1 refills | 28.00000 days | Status: CP
Start: 2019-07-12 — End: ?

## 2019-08-23 ENCOUNTER — Other Ambulatory Visit: Payer: Self-pay

## 2019-08-23 DIAGNOSIS — Z20822 Contact with and (suspected) exposure to covid-19: Secondary | ICD-10-CM

## 2019-08-24 LAB — NOVEL CORONAVIRUS, NAA: SARS-CoV-2, NAA: NOT DETECTED

## 2019-09-13 ENCOUNTER — Ambulatory Visit: Admit: 2019-09-13 | Discharge: 2019-09-15 | Disposition: A | Discharge status: Home / Self Care | Payer: MEDICAID

## 2019-09-14 MED ORDER — LEVOTHYROXINE 100 MCG TABLET
ORAL_TABLET | Freq: Every day | ORAL | 1 refills | 90.00000 days | Status: CP
Start: 2019-09-14 — End: 2020-03-12

## 2019-09-14 MED ORDER — NALTREXONE 50 MG TABLET
ORAL_TABLET | Freq: Every day | ORAL | 1 refills | 30.00000 days | Status: CP
Start: 2019-09-14 — End: 2019-11-13

## 2019-09-14 MED ORDER — MAGNESIUM OXIDE 400 MG (241.3 MG MAGNESIUM) TABLET
ORAL_TABLET | Freq: Two times a day (BID) | ORAL | 2 refills | 30.00000 days | Status: CP
Start: 2019-09-14 — End: ?

## 2019-09-14 MED ORDER — AMLODIPINE 5 MG TABLET
ORAL_TABLET | Freq: Every day | ORAL | 3 refills | 90.00000 days | Status: CP
Start: 2019-09-14 — End: ?

## 2019-09-14 MED ORDER — HYDROXYZINE HCL 25 MG TABLET
ORAL_CAPSULE | Freq: Four times a day (QID) | ORAL | 0 refills | 8.00000 days | Status: CP | PRN
Start: 2019-09-14 — End: ?

## 2019-09-14 MED ORDER — CEPHALEXIN 500 MG CAPSULE
ORAL_CAPSULE | Freq: Four times a day (QID) | ORAL | 0 refills | 5.00000 days | Status: CP
Start: 2019-09-14 — End: 2019-09-19

## 2019-09-14 MED ORDER — CHLORDIAZEPOXIDE 25 MG CAPSULE
ORAL_CAPSULE | ORAL | 0 refills | 0.00000 days | Status: CP
Start: 2019-09-14 — End: ?

## 2019-09-14 MED ORDER — POTASSIUM CHLORIDE ER 20 MEQ TABLET,EXTENDED RELEASE(PART/CRYST)
ORAL_TABLET | Freq: Three times a day (TID) | ORAL | 0 refills | 1.00000 days | Status: CP
Start: 2019-09-14 — End: 2019-09-15

## 2019-09-14 MED ORDER — PANTOPRAZOLE 40 MG TABLET,DELAYED RELEASE
ORAL_TABLET | Freq: Every day | ORAL | 3 refills | 30.00000 days | Status: CP
Start: 2019-09-14 — End: 2020-01-12

## 2019-09-14 MED ORDER — FLUTICASONE PROPIONATE 220 MCG/ACTUATION HFA AEROSOL INHALER
Freq: Two times a day (BID) | RESPIRATORY_TRACT | 2 refills | 0.00000 days | Status: CP
Start: 2019-09-14 — End: ?

## 2019-09-15 MED ORDER — FOLIC ACID 1 MG TABLET
ORAL_TABLET | Freq: Every day | ORAL | 11 refills | 30.00000 days | Status: CP
Start: 2019-09-15 — End: 2020-09-14

## 2019-10-04 IMAGING — CT CT HEAD W/O CM
3 series · 16 of 46 positions shown, 19 images · non-contrast
Comparison: None.

CLINICAL DATA: 41-year-old female with seizure.

EXAM:
CT HEAD WITHOUT CONTRAST
TECHNIQUE: Contiguous axial images were obtained from the base of the skull
through the vertex without intravenous contrast.

[Series 3: head wo · axial · 0.40mm/px · z∈[+663,+783]mm · 10 of 29 slices shown, 13 images]
[im 3/29  brain]
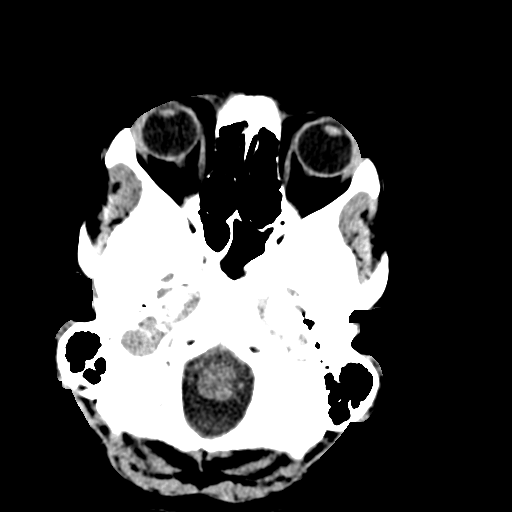
[im 3/29  bone]
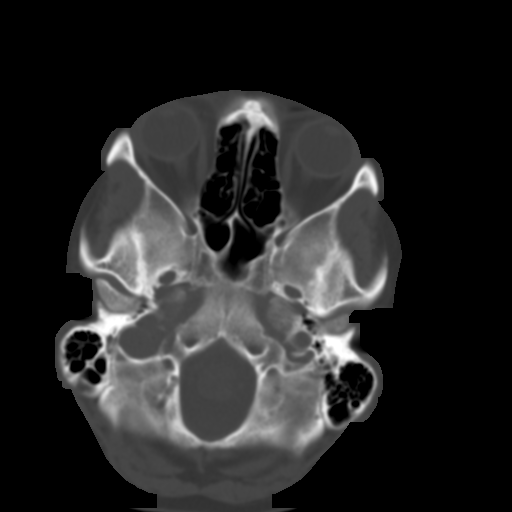
[im 6/29  brain]
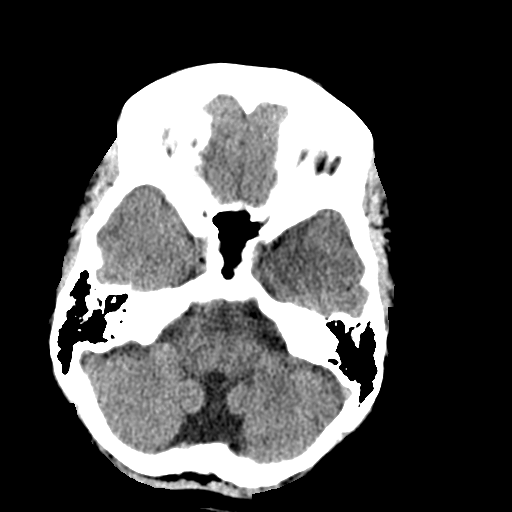
[im 8/29  brain]
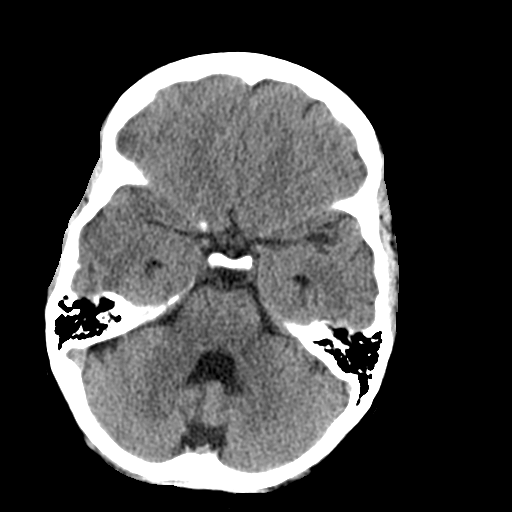
[im 11/29  brain]
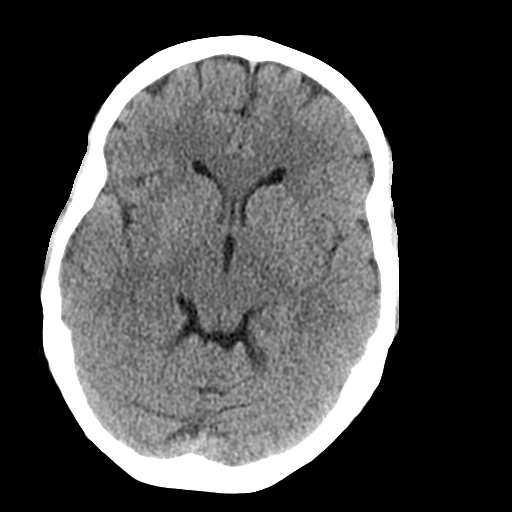
[im 14/29  brain]
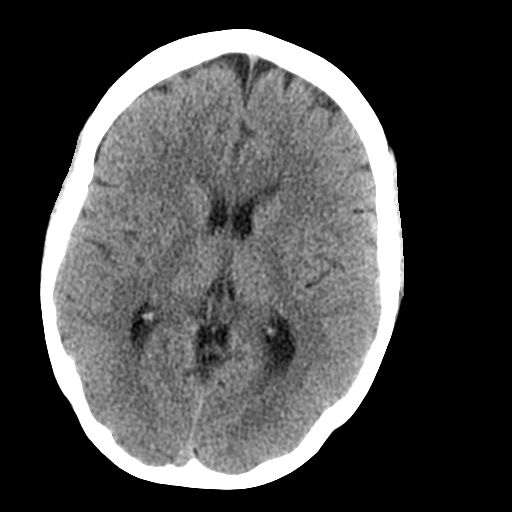
[im 14/29  bone]
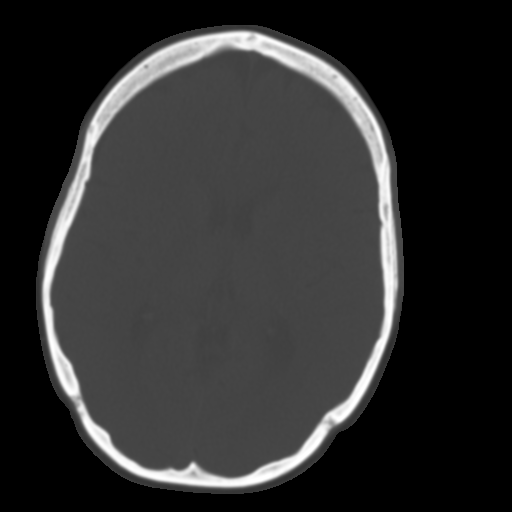
[im 16/29  brain]
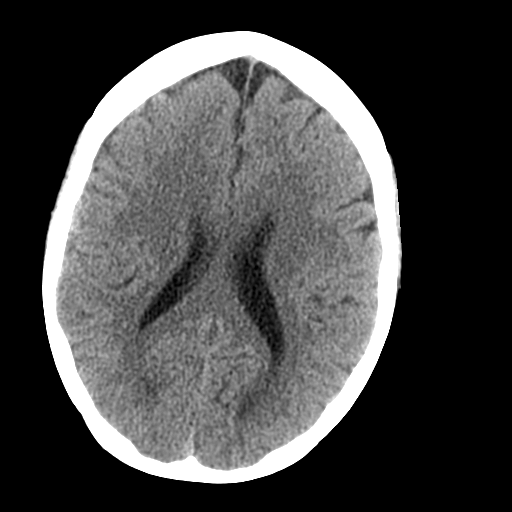
[im 19/29  brain]
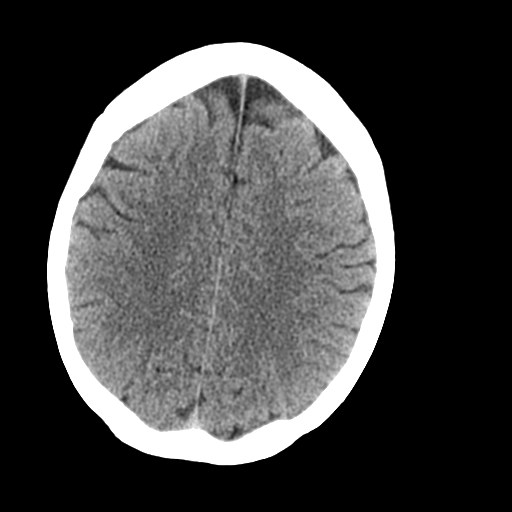
[im 22/29  brain]
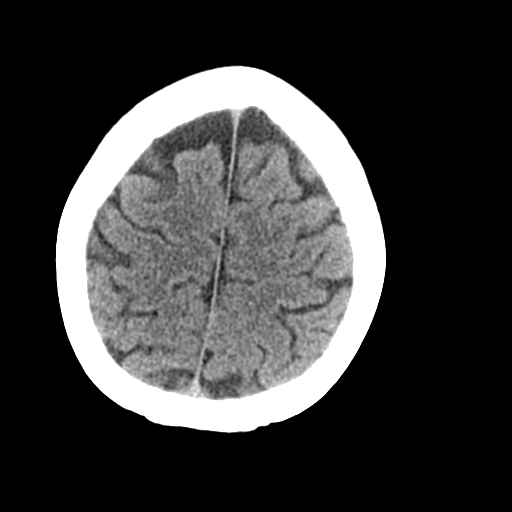
[im 24/29  brain]
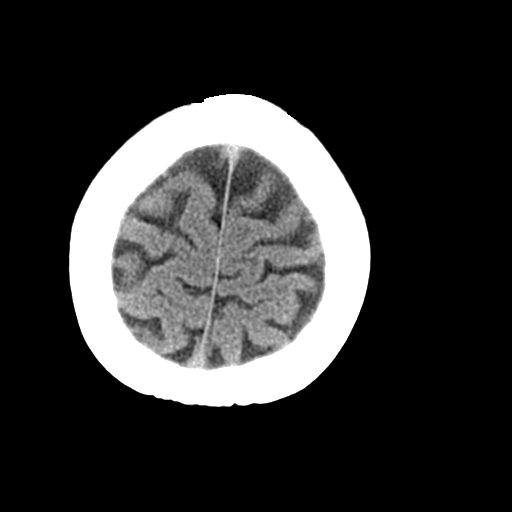
[im 24/29  bone]
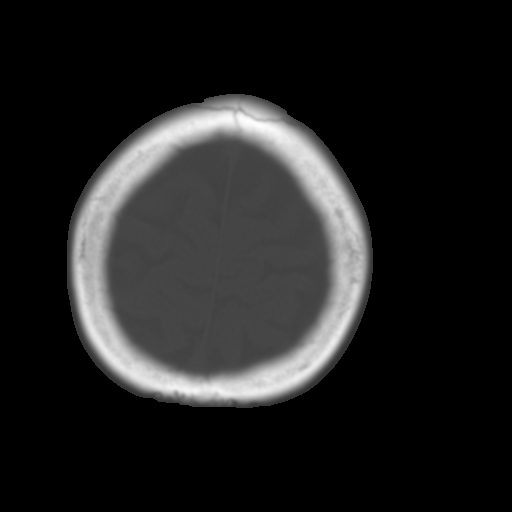
[im 27/29  brain]
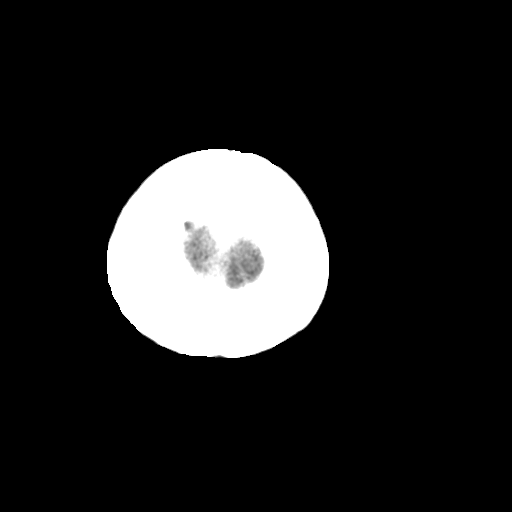

[Series 4: coronal soft tissue · coronal · 0.30mm/px · 3 of 64 slices shown]
[im 22/64  brain]
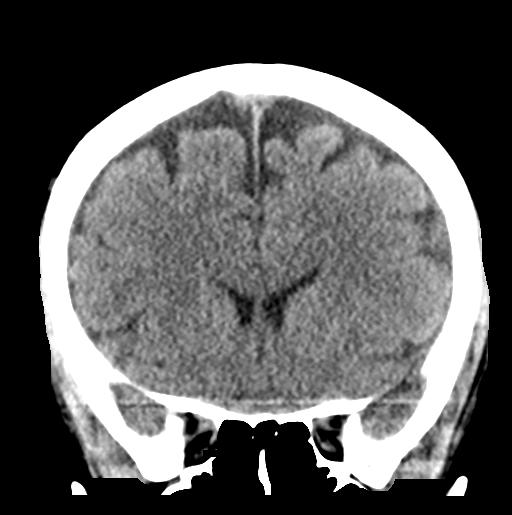
[im 29/64  brain]
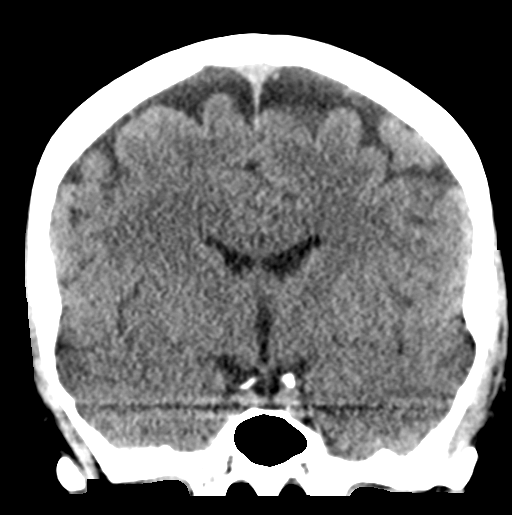
[im 36/64  brain]
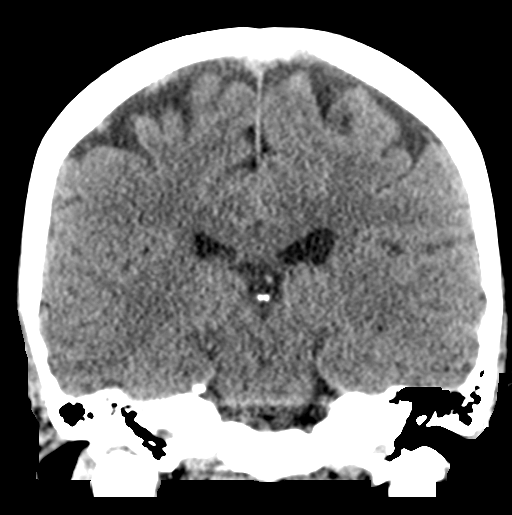

[Series 5: sagittal soft tissue · sagittal · 0.30mm/px · 3 of 51 slices shown]
[im 17/51  brain]
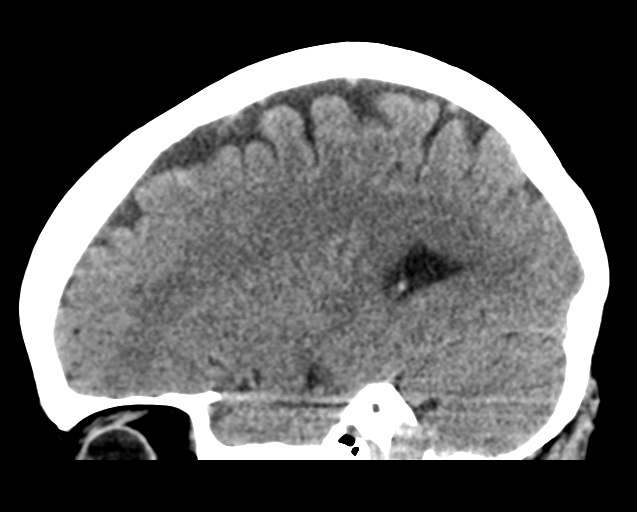
[im 26/51  brain]
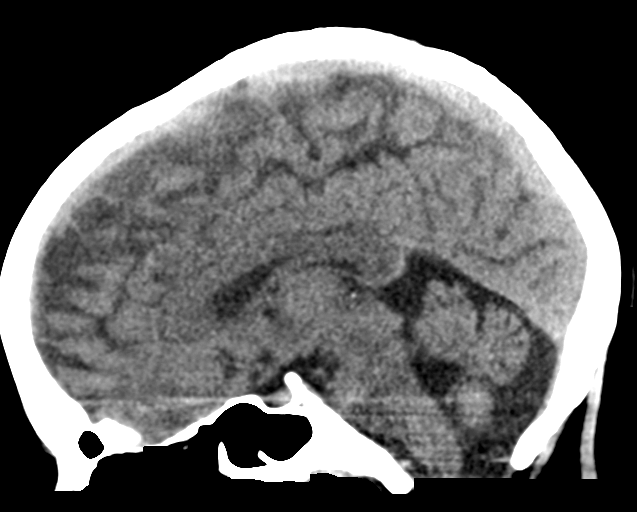
[im 34/51  brain]
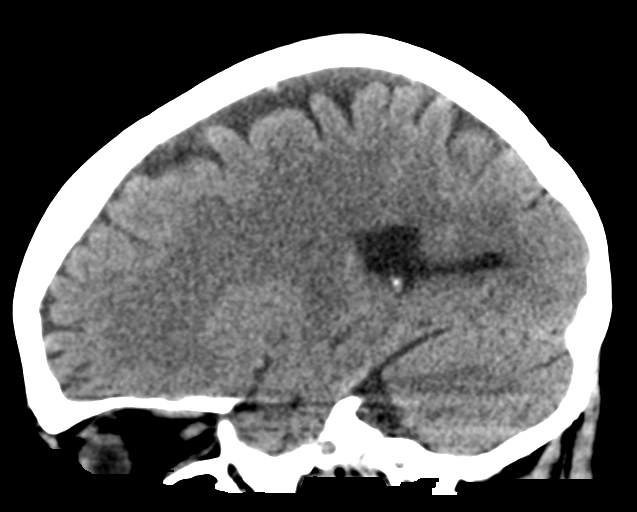

[16 of 46 positions shown; findings below may reference images not displayed]

FINDINGS: Brain: No evidence of acute infarction, hemorrhage, hydrocephalus,
extra-axial collection or mass lesion/mass effect.

Vascular: No hyperdense vessel or unexpected calcification.

Skull: Normal. Negative for fracture or focal lesion.

Sinuses/Orbits: No acute finding.

Other: None.
IMPRESSION: Unremarkable noncontrast CT of the brain.

## 2019-12-15 ENCOUNTER — Other Ambulatory Visit: Payer: Self-pay

## 2019-12-15 ENCOUNTER — Encounter: Payer: Self-pay | Admitting: Emergency Medicine

## 2019-12-15 ENCOUNTER — Emergency Department
Admission: EM | Admit: 2019-12-15 | Discharge: 2019-12-16 | Disposition: A | Discharge status: Home / Self Care | Payer: Medicaid Other | Attending: Emergency Medicine | Admitting: Emergency Medicine

## 2019-12-15 DIAGNOSIS — F17210 Nicotine dependence, cigarettes, uncomplicated: Secondary | ICD-10-CM | POA: Diagnosis not present

## 2019-12-15 DIAGNOSIS — Z79899 Other long term (current) drug therapy: Secondary | ICD-10-CM | POA: Diagnosis not present

## 2019-12-15 DIAGNOSIS — J45909 Unspecified asthma, uncomplicated: Secondary | ICD-10-CM | POA: Insufficient documentation

## 2019-12-15 DIAGNOSIS — R112 Nausea with vomiting, unspecified: Secondary | ICD-10-CM | POA: Insufficient documentation

## 2019-12-15 DIAGNOSIS — E039 Hypothyroidism, unspecified: Secondary | ICD-10-CM | POA: Diagnosis not present

## 2019-12-15 DIAGNOSIS — F1020 Alcohol dependence, uncomplicated: Secondary | ICD-10-CM | POA: Insufficient documentation

## 2019-12-15 DIAGNOSIS — K920 Hematemesis: Secondary | ICD-10-CM | POA: Diagnosis present

## 2019-12-15 DIAGNOSIS — F1010 Alcohol abuse, uncomplicated: Secondary | ICD-10-CM

## 2019-12-15 LAB — CBC
HCT: 38.2 % (ref 36.0–46.0)
Hemoglobin: 13.4 g/dL (ref 12.0–15.0)
MCH: 35.6 pg — ABNORMAL HIGH (ref 26.0–34.0)
MCHC: 35.1 g/dL (ref 30.0–36.0)
MCV: 101.6 fL — ABNORMAL HIGH (ref 80.0–100.0)
Platelets: 89 10*3/uL — ABNORMAL LOW (ref 150–400)
RBC: 3.76 MIL/uL — ABNORMAL LOW (ref 3.87–5.11)
RDW: 14.7 % (ref 11.5–15.5)
WBC: 6 10*3/uL (ref 4.0–10.5)
nRBC: 0 % (ref 0.0–0.2)

## 2019-12-15 LAB — COMPREHENSIVE METABOLIC PANEL
ALT: 62 U/L — ABNORMAL HIGH (ref 0–44)
AST: 214 U/L — ABNORMAL HIGH (ref 15–41)
Albumin: 4 g/dL (ref 3.5–5.0)
Alkaline Phosphatase: 118 U/L (ref 38–126)
Anion gap: 15 (ref 5–15)
BUN: 14 mg/dL (ref 6–20)
CO2: 27 mmol/L (ref 22–32)
Calcium: 8.8 mg/dL — ABNORMAL LOW (ref 8.9–10.3)
Chloride: 98 mmol/L (ref 98–111)
Creatinine, Ser: 0.53 mg/dL (ref 0.44–1.00)
GFR calc Af Amer: >60 mL/min (ref >60)
GFR calc non Af Amer: >60 mL/min (ref >60)
Glucose, Bld: 96 mg/dL (ref 70–99)
Potassium: 3.1 mmol/L — ABNORMAL LOW (ref 3.5–5.1)
Sodium: 140 mmol/L (ref 135–145)
Total Bilirubin: 1.9 mg/dL — ABNORMAL HIGH (ref 0.3–1.2)
Total Protein: 7.4 g/dL (ref 6.5–8.1)

## 2019-12-15 LAB — ETHANOL: Alcohol, Ethyl (B): 361 mg/dL (ref <10)

## 2019-12-15 MED ORDER — LORAZEPAM 2 MG/ML IJ SOLN: 0.0000 mg | Freq: Two times a day (BID) | INTRAMUSCULAR | Status: DC

## 2019-12-15 MED ORDER — ONDANSETRON HCL 4 MG/2ML IJ SOLN
4.0000 mg | Freq: Once | INTRAMUSCULAR | Status: AC
  Administered 2019-12-15: 23:00:00 4 mg via INTRAVENOUS
  Filled 2019-12-15: qty 2

## 2019-12-15 MED ORDER — LORAZEPAM 2 MG PO TABS
0.0000 mg | ORAL_TABLET | Freq: Four times a day (QID) | ORAL | Status: DC
  Administered 2019-12-16: 1 mg via ORAL
  Filled 2019-12-15: qty 1

## 2019-12-15 MED ORDER — THIAMINE HCL 100 MG PO TABS
100.0000 mg | ORAL_TABLET | Freq: Every day | ORAL | Status: DC
  Administered 2019-12-16: 100 mg via ORAL
  Filled 2019-12-15: qty 1

## 2019-12-15 MED ORDER — SODIUM CHLORIDE 0.9 % IV BOLUS
1000.0000 mL | Freq: Once | INTRAVENOUS | Status: AC
  Administered 2019-12-15: 1000 mL via INTRAVENOUS

## 2019-12-15 MED ORDER — THIAMINE HCL 100 MG/ML IJ SOLN: 100.0000 mg | Freq: Every day | INTRAMUSCULAR | Status: DC

## 2019-12-15 MED ORDER — LORAZEPAM 2 MG PO TABS: 0.0000 mg | ORAL_TABLET | Freq: Two times a day (BID) | ORAL | Status: DC

## 2019-12-15 MED ORDER — LORAZEPAM 2 MG/ML IJ SOLN: 0.0000 mg | Freq: Four times a day (QID) | INTRAMUSCULAR | Status: DC

## 2019-12-15 NOTE — ED Provider Notes (Signed)
[facility] Emergency Department Provider Note  Time seen: 10:21 PM  I have reviewed the triage vital signs and the nursing notes.   HISTORY  Chief Complaint Hematemesis and Alcohol Problem   HPI Terri Mann is a 44 y.o. female with a past medical history of asthma, gastric reflux, substance abuse, alcohol abuse, presents to the emergency department with nausea vomiting hoping to detox.  According to the patient she last used alcohol approximately 3 to 4 hours ago, states heavy alcohol use at baseline history of withdrawal seizures as well.  Patient states she has been nauseated with frequent episodes of vomiting today, states she is tired of drinking and wants to detox.  Did note some blood streaking in the vomiting earlier today.  Patient is in mild distress, appears somewhat uncomfortable nauseated with occasional dry heaving.  Patient denies any fever cough or shortness of breath.  Denies any chest pain or abdominal pain.  Largely negative review of systems otherwise.   Past Medical History:  Diagnosis Date  . Asthma   . Chronic back pain   . GERD (gastroesophageal reflux disease)   . Pancreatitis   . Thyroid disease     Patient Active Problem List   Diagnosis Date Noted  . Alcohol abuse 10/01/2018  . Substance induced mood disorder ([place]) 10/01/2018  . Asthma 01/04/2018  . GERD (gastroesophageal reflux disease) 01/04/2018  . Hypothyroidism 01/04/2018  . Alcohol withdrawal seizure without complication ([place]) 75/08/2584  . Alcohol withdrawal seizure ([place]) 01/04/2018  . Acute pancreatitis 08/11/2016  . Pancreatic insufficiency 08/10/2016    Past Surgical History:  Procedure Laterality Date  . tubial ligation       Prior to Admission medications   Medication Sig Start Date End Date Taking? Authorizing Provider  amLODipine (NORVASC) 5 MG tablet Take 5 mg by mouth daily. 03/11/18   [provider]  cyclobenzaprine (FLEXERIL) 10 MG tablet  Take 10 mg by mouth 3 (three) times daily. 09/04/18   [provider]  FLOVENT HFA 220 MCG/ACT inhaler Inhale 1 puff into the lungs 2 (two) times daily.    [provider]  FLUoxetine (PROZAC) 10 MG capsule Take 10 mg by mouth daily.  09/07/18   [provider]  hydrOXYzine (ATARAX/VISTARIL) 25 MG tablet Take 25 mg by mouth 3 (three) times daily as needed. 09/07/18   [provider]  levothyroxine (SYNTHROID) 100 MCG tablet Take 1 tablet (100 mcg total) by mouth daily. 08/25/17 01/03/19  Arta Silence, MD  loratadine (CLARITIN) 10 MG tablet Take 10 mg by mouth daily as needed for allergies.    [provider]  meloxicam (MOBIC) 7.5 MG tablet Take 7.5 mg by mouth 2 (two) times daily. 09/07/18   [provider]  naltrexone (DEPADE) 50 MG tablet Take 50 mg by mouth daily. 09/07/18   [provider]  pantoprazole (PROTONIX) 40 MG tablet Take 1 tablet (40 mg total) by mouth daily. Patient taking differently: Take 40 mg by mouth 2 (two) times daily as needed (Acid reflux).  12/30/16 05/14/19  Merlyn Lot, MD  STIOLTO RESPIMAT 2.5-2.5 MCG/ACT AERS 2 [place] 09/07/18   [provider]  Vitamin D, Ergocalciferol, (DRISDOL) 50000 units CAPS capsule Take 50,000 Units by mouth every 7 (seven) days. Tuesdays    [provider]    No Known Allergies  Family History  Problem Relation Age of Onset  . Thyroid disease Mother   . Heart attack Father   .  Thyroid disease Sister   . Liver disease Brother     Social History Social History   Tobacco Use  . Smoking status: Current Some Day Smoker    Packs/day: 2.00    Types: Cigarettes  . Smokeless tobacco: Never Used  Substance Use Topics  . Alcohol use: Yes    Alcohol/week: 140.0 standard drinks    Types: 140 Shots of liquor per week  . Drug use: No    Review of Systems Constitutional: Negative for fever. Cardiovascular: Negative for chest  pain. Respiratory: Negative for shortness of breath. Gastrointestinal: Negative for abdominal pain.  Positive for nausea and vomiting. Musculoskeletal: Negative for musculoskeletal complaints Neurological: Negative for headache All other ROS negative  ____________________________________________   PHYSICAL EXAM:  VITAL SIGNS: ED Triage Vitals [12/15/19 2147]  Enc Vitals Group     BP 96/74     Pulse Rate (!) 138     Resp 20     Temp 97.9 F (36.6 C)     Temp Source Oral     SpO2 97 %     Weight 140 lb (63.5 kg)     Height 5\' 8"  (1.727 m)     Head Circumference      Peak Flow      Pain Score 8     Pain Loc      Pain Edu?      Excl. in GC?    Constitutional: Alert and oriented. Well appearing and in no distress. Eyes: Normal exam ENT      Head: Normocephalic and atraumatic      Mouth/Throat: Mucous membranes are moist. Cardiovascular: Regular rhythm rate around 130 bpm.  No obvious murmur. Respiratory: Normal respiratory effort without tachypnea nor retractions. Breath sounds are clear Gastrointestinal: Soft and nontender. No distention.  Musculoskeletal: Nontender with normal range of motion in all extremities.  Neurologic:  Normal speech and language. No gross focal neurologic deficits Skin:  Skin is warm, dry and intact.  Psychiatric: Mood and affect are normal  ____________________________________________    EKG  EKG viewed and interpreted by myself shows a tachycardia likely sinus tachycardia 134 bpm with a narrow QRS, normal axis, slight QTC prolongation otherwise normal intervals, nonspecific ST changes.  ____________________________________________   INITIAL IMPRESSION / ASSESSMENT AND PLAN / ED COURSE  Pertinent labs & imaging results that were available during my care of the patient were reviewed by me and considered in my medical decision making (see chart for details).   Patient presents emergency department for nausea vomiting wishes for detox.   Patient with nausea states dry heaving.  Last drink approximately 4 hours ago per patient.  Denies any substance use.  Patient's labs have resulted showing alcohol 361.  We will treat nausea with Zofran, IV fluids and continue to closely monitor the patient.  I have ordered CIWA precautions for the patient.  TTS will evaluate and look for detox for the patient.  Patient agreeable to plan of care.  Patient is here voluntarily.  Terri Mann was evaluated in Emergency Department on 12/15/2019 for the symptoms described in the history of present illness. She was evaluated in the context of the global COVID-19 pandemic, which necessitated consideration that the patient might be at risk for infection with the SARS-CoV-2 virus that causes COVID-19. Institutional protocols and algorithms that pertain to the evaluation of patients at risk for COVID-19 are in a state of rapid change based on information released by regulatory bodies including the CDC and federal  and state organizations. These policies and algorithms were followed during the patient's care in the ED.  ____________________________________________   FINAL CLINICAL IMPRESSION(S) / ED DIAGNOSES  Alcohol abuse Nausea vomiting   Minna Antis, MD 12/15/19 2256

## 2019-12-15 NOTE — ED Notes (Signed)
Pt in with co alcohol intoxication and wanting detox. Denies any other drug use, has been an alcohol for years and tried detox a few months ago but was unsuccessful. Pt states she has had n/v/d since yesterday with abd pain. Has seen streaks of blood in vomit, still co nausea at present. Pt states she has hx of seizures due to withdrawals, states last one was a few months. PT denies any SI or HI at this time.

## 2019-12-15 NOTE — ED Triage Notes (Addendum)
Patient to ER for c/o vomiting blood today. Patient also states she wants alcohol detox, last drink was 2-3 hours ago. Patient tachycardic in triage. +History of seizures with attempting to stop drinking.

## 2019-12-15 NOTE — ED Notes (Signed)
TTS in to speak with pt.  

## 2019-12-16 LAB — URINE DRUG SCREEN, QUALITATIVE (ARMC ONLY)
Amphetamines, Ur Screen: NOT DETECTED
Barbiturates, Ur Screen: NOT DETECTED
Benzodiazepine, Ur Scrn: POSITIVE — AB
Cannabinoid 50 Ng, Ur ~~LOC~~: POSITIVE — AB
Cocaine Metabolite,Ur ~~LOC~~: NOT DETECTED
MDMA (Ecstasy)Ur Screen: NOT DETECTED
Methadone Scn, Ur: NOT DETECTED
Opiate, Ur Screen: NOT DETECTED
Phencyclidine (PCP) Ur S: NOT DETECTED
Tricyclic, Ur Screen: NOT DETECTED

## 2019-12-16 LAB — POCT PREGNANCY, URINE: Preg Test, Ur: NEGATIVE

## 2019-12-16 LAB — ETHANOL: Alcohol, Ethyl (B): 96 mg/dL — ABNORMAL HIGH (ref <10)

## 2019-12-16 MED ORDER — CHLORDIAZEPOXIDE HCL 25 MG PO CAPS
50.0000 mg | ORAL_CAPSULE | Freq: Three times a day (TID) | ORAL | Status: DC
  Administered 2019-12-16 (×2): 50 mg via ORAL
  Filled 2019-12-16 (×2): qty 2

## 2019-12-16 MED ORDER — CHLORDIAZEPOXIDE HCL 25 MG PO CAPS: ORAL_CAPSULE | ORAL | 0 refills | Status: DC

## 2019-12-16 MED ORDER — NICOTINE 21 MG/24HR TD PT24
21.0000 mg | MEDICATED_PATCH | Freq: Once | TRANSDERMAL | Status: DC
  Administered 2019-12-16: 21 mg via TRANSDERMAL
  Filled 2019-12-16: qty 1

## 2019-12-16 NOTE — BH Assessment (Signed)
Patient currently under review with [facility].

## 2019-12-16 NOTE — ED Notes (Signed)
Pt resting with eyes closed, no discomfort noted.

## 2019-12-16 NOTE — BH Assessment (Addendum)
Writer spoke with patient about outpatient treatment options, because she no longer want to go to a detox facility. Patient denies SI/HI and AV/H.  Writer called and updated [facility] ([phone])  ____________________ [facility] [address],  [place], [place] [address] [phone]  [facility] [address]  [place], [place] [address] [phone]

## 2019-12-16 NOTE — ED Notes (Signed)
Per TTS pt is accepted to [facility], they will pick her up at 1900 today. Etoh needs to be below 200 and needs to have home meds with her.

## 2019-12-16 NOTE — ED Notes (Signed)
Patient expressing that she is unsure about going to [facility] at this time. Reports mother was the one who really wanted her to go. Patient states she would rather go through and outpatient program so that she can remain with family. TTS and MD made aware of patient's request.

## 2019-12-16 NOTE — BH Assessment (Addendum)
Referral information for Psychiatric Hospitalization faxed to;   [facility] [phone]) Under review  . [facility] [phone]) Call today (12/16/19) around 12pm, could have a potential discharge   [facility] [phone]) No answer   [facility] [phone]) Re-fax manually, referral will be reviewed between 8am-8pm

## 2019-12-16 NOTE — BH Assessment (Signed)
Patient was re-faxed manually to [facility] [phone])

## 2019-12-16 NOTE — BH Assessment (Signed)
PATIENT BED AVAILABLE AT 7PM 12/16/2019, [facility] WILL TRANSPORT PATIENT PENDING BAL IS 200 OR LESS AND PATIENT HAS HER HOME MEDICATIONS WITH HER  [facility] has been [facility] Patient assigned to room 3B  Representative was Marylu Lund.   ER Staff is aware of it:  [facility] ER Secretary  Dr. Don Perking, ER MD  Raquel Patient's Nurse

## 2019-12-16 NOTE — Discharge Instructions (Addendum)
Thank you for letting us take care of you in the emergency department today.   New medications we have prescribed:  - Librium - this medication is to help with withdrawal symptoms.  Please stop taking immediately if you resume alcohol use.  Please follow up with: - The detox resources provided to you  Please return to the ER for any new or worsening symptoms.

## 2019-12-16 NOTE — ED Provider Notes (Signed)
7:00 AM Received patient in signout.  Patient accepted to [facility] for detox, they plan to pick her up at 1900 today.  Will recheck alcohol level for medical clearance, [facility] requires alcohol level to be below 200.  12:03 PM Patient now expressing that she would not like to go to [facility] for detox at this time, would prefer attempted detox as an outpatient so she can remain with her family.  TTS has seen and evaluated her again, provided outpatient resources.  Discussed this with the patient as well, who confirms she would like to pursue outpatient options at this time.  TTS will contact [facility] and update them.  She is open to Rx for Librium taper, understands to take exactly as directed, and if she is to resume alcohol use she should not continue with the Librium.  She voices understanding of this.  Patient is clinically sober - clear speech, linear thought process, and has tolerated PO in the ED.   Will plan for discharge with outpatient resources.  Given return precautions.     Miguel Aschoff., MD 12/16/19 [phone]
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
## 2020-03-09 ENCOUNTER — Observation Stay
Admission: EM | Admit: 2020-03-09 | Discharge: 2020-03-10 | Discharge status: Against Medical Advice | Payer: Medicaid Other | Attending: Internal Medicine | Admitting: Internal Medicine

## 2020-03-09 ENCOUNTER — Emergency Department: Payer: Medicaid Other

## 2020-03-09 ENCOUNTER — Other Ambulatory Visit: Payer: Self-pay

## 2020-03-09 DIAGNOSIS — K7010 Alcoholic hepatitis without ascites: Secondary | ICD-10-CM | POA: Insufficient documentation

## 2020-03-09 DIAGNOSIS — F10230 Alcohol dependence with withdrawal, uncomplicated: Secondary | ICD-10-CM

## 2020-03-09 DIAGNOSIS — Z7951 Long term (current) use of inhaled steroids: Secondary | ICD-10-CM | POA: Diagnosis not present

## 2020-03-09 DIAGNOSIS — F1020 Alcohol dependence, uncomplicated: Secondary | ICD-10-CM

## 2020-03-09 DIAGNOSIS — Z79899 Other long term (current) drug therapy: Secondary | ICD-10-CM | POA: Insufficient documentation

## 2020-03-09 DIAGNOSIS — R569 Unspecified convulsions: Secondary | ICD-10-CM | POA: Insufficient documentation

## 2020-03-09 DIAGNOSIS — K8689 Other specified diseases of pancreas: Secondary | ICD-10-CM | POA: Diagnosis present

## 2020-03-09 DIAGNOSIS — Z20822 Contact with and (suspected) exposure to covid-19: Secondary | ICD-10-CM | POA: Diagnosis not present

## 2020-03-09 DIAGNOSIS — Y907 Blood alcohol level of 200-239 mg/100 ml: Secondary | ICD-10-CM | POA: Insufficient documentation

## 2020-03-09 DIAGNOSIS — F17210 Nicotine dependence, cigarettes, uncomplicated: Secondary | ICD-10-CM | POA: Diagnosis not present

## 2020-03-09 DIAGNOSIS — F1010 Alcohol abuse, uncomplicated: Secondary | ICD-10-CM

## 2020-03-09 DIAGNOSIS — J45909 Unspecified asthma, uncomplicated: Secondary | ICD-10-CM | POA: Insufficient documentation

## 2020-03-09 DIAGNOSIS — E039 Hypothyroidism, unspecified: Secondary | ICD-10-CM | POA: Insufficient documentation

## 2020-03-09 DIAGNOSIS — K219 Gastro-esophageal reflux disease without esophagitis: Secondary | ICD-10-CM | POA: Insufficient documentation

## 2020-03-09 DIAGNOSIS — Z8719 Personal history of other diseases of the digestive system: Secondary | ICD-10-CM | POA: Diagnosis not present

## 2020-03-09 DIAGNOSIS — F10930 Alcohol use, unspecified with withdrawal, uncomplicated: Secondary | ICD-10-CM

## 2020-03-09 DIAGNOSIS — F10939 Alcohol use, unspecified with withdrawal, unspecified: Secondary | ICD-10-CM

## 2020-03-09 DIAGNOSIS — F10239 Alcohol dependence with withdrawal, unspecified: Secondary | ICD-10-CM | POA: Diagnosis not present

## 2020-03-09 DIAGNOSIS — R7401 Elevation of levels of liver transaminase levels: Secondary | ICD-10-CM

## 2020-03-09 HISTORY — DX: Alcohol abuse, uncomplicated: F10.10

## 2020-03-09 LAB — COMPREHENSIVE METABOLIC PANEL
ALT: 50 U/L — ABNORMAL HIGH (ref 0–44)
AST: 265 U/L — ABNORMAL HIGH (ref 15–41)
Albumin: 3.2 g/dL — ABNORMAL LOW (ref 3.5–5.0)
Alkaline Phosphatase: 149 U/L — ABNORMAL HIGH (ref 38–126)
Anion gap: 13 (ref 5–15)
BUN: 12 mg/dL (ref 6–20)
CO2: 25 mmol/L (ref 22–32)
Calcium: 8.6 mg/dL — ABNORMAL LOW (ref 8.9–10.3)
Chloride: 102 mmol/L (ref 98–111)
Creatinine, Ser: 0.37 mg/dL — ABNORMAL LOW (ref 0.44–1.00)
GFR calc Af Amer: >60 mL/min (ref >60)
GFR calc non Af Amer: >60 mL/min (ref >60)
Glucose, Bld: 98 mg/dL (ref 70–99)
Potassium: 3.6 mmol/L (ref 3.5–5.1)
Sodium: 140 mmol/L (ref 135–145)
Total Bilirubin: 1.1 mg/dL (ref 0.3–1.2)
Total Protein: 6.5 g/dL (ref 6.5–8.1)

## 2020-03-09 LAB — CBC WITH DIFFERENTIAL/PLATELET
Abs Immature Granulocytes: 0.02 10*3/uL (ref 0.00–0.07)
Basophils Absolute: 0 10*3/uL (ref 0.0–0.1)
Basophils Relative: 1 %
Eosinophils Absolute: 0 10*3/uL (ref 0.0–0.5)
Eosinophils Relative: 1 %
HCT: 34.7 % — ABNORMAL LOW (ref 36.0–46.0)
Hemoglobin: 12.1 g/dL (ref 12.0–15.0)
Immature Granulocytes: 0 %
Lymphocytes Relative: 36 %
Lymphs Abs: 2.1 10*3/uL (ref 0.7–4.0)
MCH: 37.9 pg — ABNORMAL HIGH (ref 26.0–34.0)
MCHC: 34.9 g/dL (ref 30.0–36.0)
MCV: 108.8 fL — ABNORMAL HIGH (ref 80.0–100.0)
Monocytes Absolute: 0.5 10*3/uL (ref 0.1–1.0)
Monocytes Relative: 9 %
Neutro Abs: 3.2 10*3/uL (ref 1.7–7.7)
Neutrophils Relative %: 53 %
Platelets: 134 10*3/uL — ABNORMAL LOW (ref 150–400)
RBC: 3.19 MIL/uL — ABNORMAL LOW (ref 3.87–5.11)
RDW: 15.7 % — ABNORMAL HIGH (ref 11.5–15.5)
WBC: 5.9 10*3/uL (ref 4.0–10.5)
nRBC: 0 % (ref 0.0–0.2)

## 2020-03-09 LAB — PHOSPHORUS: Phosphorus: 3.4 mg/dL (ref 2.5–4.6)

## 2020-03-09 LAB — ETHANOL: Alcohol, Ethyl (B): 231 mg/dL — ABNORMAL HIGH (ref <10)

## 2020-03-09 LAB — RESPIRATORY PANEL BY RT PCR (FLU A&B, COVID)
Influenza A by PCR: NEGATIVE
Influenza B by PCR: NEGATIVE
SARS Coronavirus 2 by RT PCR: NEGATIVE

## 2020-03-09 LAB — MAGNESIUM: Magnesium: 1.7 mg/dL (ref 1.7–2.4)

## 2020-03-09 MED ORDER — ADULT MULTIVITAMIN W/MINERALS CH
1.0000 | ORAL_TABLET | Freq: Every day | ORAL | Status: DC
  Administered 2020-03-09 – 2020-03-10 (×2): 1 via ORAL
  Filled 2020-03-09 (×2): qty 1

## 2020-03-09 MED ORDER — THIAMINE HCL 100 MG PO TABS
100.0000 mg | ORAL_TABLET | Freq: Every day | ORAL | Status: DC
  Administered 2020-03-09: 100 mg via ORAL
  Filled 2020-03-09 (×2): qty 1

## 2020-03-09 MED ORDER — THIAMINE HCL 100 MG/ML IJ SOLN
Freq: Once | INTRAVENOUS | Status: AC
  Filled 2020-03-09: qty 1000

## 2020-03-09 MED ORDER — ADULT MULTIVITAMIN W/MINERALS CH
1.0000 | ORAL_TABLET | Freq: Every day | ORAL | Status: DC
  Filled 2020-03-09: qty 1

## 2020-03-09 MED ORDER — LORAZEPAM 2 MG/ML IJ SOLN
1.0000 mg | Freq: Once | INTRAMUSCULAR | Status: AC
  Administered 2020-03-09: 18:00:00 1 mg via INTRAVENOUS
  Filled 2020-03-09: qty 1

## 2020-03-09 MED ORDER — LORAZEPAM 2 MG/ML IJ SOLN: 1.0000 mg | INTRAMUSCULAR | Status: DC | PRN

## 2020-03-09 MED ORDER — NICOTINE 21 MG/24HR TD PT24: 21.0000 mg | MEDICATED_PATCH | Freq: Every day | TRANSDERMAL | Status: DC

## 2020-03-09 MED ORDER — NICOTINE 21 MG/24HR TD PT24
21.0000 mg | MEDICATED_PATCH | Freq: Every day | TRANSDERMAL | Status: DC
  Administered 2020-03-09 – 2020-03-10 (×2): 21 mg via TRANSDERMAL
  Filled 2020-03-09 (×2): qty 1

## 2020-03-09 MED ORDER — LORAZEPAM 2 MG/ML IJ SOLN: 0.0000 mg | Freq: Two times a day (BID) | INTRAMUSCULAR | Status: DC

## 2020-03-09 MED ORDER — OXYCODONE HCL 5 MG PO TABS: 5.0000 mg | ORAL_TABLET | ORAL | Status: DC | PRN

## 2020-03-09 MED ORDER — ACETAMINOPHEN 325 MG PO TABS: 650.0000 mg | ORAL_TABLET | Freq: Four times a day (QID) | ORAL | Status: DC | PRN

## 2020-03-09 MED ORDER — ONDANSETRON HCL 4 MG PO TABS
4.0000 mg | ORAL_TABLET | Freq: Four times a day (QID) | ORAL | Status: DC | PRN
  Administered 2020-03-10: 4 mg via ORAL
  Filled 2020-03-09: qty 1

## 2020-03-09 MED ORDER — LORAZEPAM 2 MG/ML IJ SOLN: 0.0000 mg | Freq: Four times a day (QID) | INTRAMUSCULAR | Status: DC

## 2020-03-09 MED ORDER — FOLIC ACID 1 MG PO TABS
1.0000 mg | ORAL_TABLET | Freq: Every day | ORAL | Status: DC
  Filled 2020-03-09: qty 1

## 2020-03-09 MED ORDER — THIAMINE HCL 100 MG/ML IJ SOLN
100.0000 mg | Freq: Every day | INTRAMUSCULAR | Status: DC
  Filled 2020-03-09: qty 2

## 2020-03-09 MED ORDER — LORAZEPAM 2 MG PO TABS: 0.0000 mg | ORAL_TABLET | Freq: Four times a day (QID) | ORAL | Status: DC

## 2020-03-09 MED ORDER — ACETAMINOPHEN 650 MG RE SUPP: 650.0000 mg | Freq: Four times a day (QID) | RECTAL | Status: DC | PRN

## 2020-03-09 MED ORDER — THIAMINE HCL 100 MG PO TABS
100.0000 mg | ORAL_TABLET | Freq: Every day | ORAL | Status: DC
  Administered 2020-03-09: 23:00:00 100 mg via ORAL
  Filled 2020-03-09 (×3): qty 1

## 2020-03-09 MED ORDER — LORAZEPAM 2 MG PO TABS: 0.0000 mg | ORAL_TABLET | Freq: Two times a day (BID) | ORAL | Status: DC

## 2020-03-09 MED ORDER — THIAMINE HCL 100 MG PO TABS
100.0000 mg | ORAL_TABLET | Freq: Every day | ORAL | Status: DC
  Administered 2020-03-10: 100 mg via ORAL
  Filled 2020-03-09: qty 1

## 2020-03-09 MED ORDER — ENOXAPARIN SODIUM 40 MG/0.4ML ~~LOC~~ SOLN
40.0000 mg | SUBCUTANEOUS | Status: DC
  Administered 2020-03-09: 40 mg via SUBCUTANEOUS
  Filled 2020-03-09: qty 0.4

## 2020-03-09 MED ORDER — FOLIC ACID 1 MG PO TABS
1.0000 mg | ORAL_TABLET | Freq: Every day | ORAL | Status: DC
  Administered 2020-03-09 – 2020-03-10 (×2): 1 mg via ORAL
  Filled 2020-03-09 (×2): qty 1

## 2020-03-09 MED ORDER — ONDANSETRON HCL 4 MG/2ML IJ SOLN: 4.0000 mg | Freq: Four times a day (QID) | INTRAMUSCULAR | Status: DC | PRN

## 2020-03-09 MED ORDER — LORAZEPAM 1 MG PO TABS
1.0000 mg | ORAL_TABLET | ORAL | Status: DC | PRN
  Administered 2020-03-09: 1 mg via ORAL
  Administered 2020-03-10 (×2): 2 mg via ORAL
  Filled 2020-03-09: qty 1
  Filled 2020-03-09 (×2): qty 2

## 2020-03-09 MED ORDER — LACTATED RINGERS IV BOLUS
1000.0000 mL | Freq: Once | INTRAVENOUS | Status: AC
  Administered 2020-03-09: 18:00:00 1000 mL via INTRAVENOUS

## 2020-03-09 NOTE — H&P (Signed)
History and Physical    ANIQUE BECKLEY GEX:[id number] DOB: 10/18/1976 DOA: 03/09/2020  PCP: System, Pcp Not In   Patient coming from: Home I have personally briefly reviewed patient's old medical records in [place]  Chief Complaint: Seizure  HPI: Terri Mann is a 44 y.o. female with medical history significant for hypothyroidism, history of pancreatitis and heavy alcohol abuse up to fifth of liquor daily who has been trying over the past few months to cut back who presents to the emergency room following an unwitnessed seizure with urinary incontinence and tongue biting.  Patient states that this is her fifth such episode in the past 3 months that she has been trying to cut back.  She has not come to the emergency room previously.  States that today she drank just half of her usual amount.  She denies any headache or neck pain, fever or weakness in the extremity.  Has not been recently ill and denies cough or shortness of breath, chest pain, nausea vomiting or diarrhea  ED Course: By arrival in the emergency room she was awake and alert.  Blood alcohol level was 231.  She had a AST to 65 and ALT 50, alk phos 149.  Other blood work mostly unremarkable.  Head CT showed no acute intracranial abnormality.  Hospitalist consulted for admission.  Review of Systems: As per HPI otherwise 10 point review of systems negative.    Past Medical History:  Diagnosis Date  . Asthma   . Chronic back pain   . GERD (gastroesophageal reflux disease)   . Pancreatitis   . Thyroid disease     Past Surgical History:  Procedure Laterality Date  . tubial ligation        reports that she has been smoking cigarettes. She has been smoking about 2.00 packs per day. She has never used smokeless tobacco. She reports current alcohol use of about 140.0 standard drinks of alcohol per week. She reports that she does not use drugs.  No Known Allergies  Family History  Problem Relation Age of Onset  .  Thyroid disease Mother   . Heart attack Father   . Thyroid disease Sister   . Liver disease Brother      Prior to Admission medications   Medication Sig Start Date End Date Taking? Authorizing Provider  amLODipine (NORVASC) 5 MG tablet Take 5 mg by mouth daily. 03/11/18   [provider]  chlordiazePOXIDE (LIBRIUM) 25 MG capsule Day 1: take 50 mg (2 capsules) in AM, afternoon, and evening. Day 2: take 50 mg (2 capsules) AM and PM. Day 3: take 50 mg (2 capsules) in AM 12/16/19   Lilia Pro., MD  FLOVENT HFA 220 MCG/ACT inhaler Inhale 1 puff into the lungs 2 (two) times daily as needed.     [provider]  levothyroxine (SYNTHROID) 100 MCG tablet Take 1 tablet (100 mcg total) by mouth daily. 08/25/17 12/16/19  Arta Silence, MD  pantoprazole (PROTONIX) 40 MG tablet Take 1 tablet (40 mg total) by mouth daily. Patient taking differently: Take 40 mg by mouth 2 (two) times daily as needed (Acid reflux).  12/30/16 12/16/19  Merlyn Lot, MD  STIOLTO RESPIMAT 2.5-2.5 MCG/ACT AERS Inhale 2 puffs into the lungs daily as needed.  09/07/18   [provider]  traZODone (DESYREL) 50 MG tablet Take 1 tablet by mouth at bedtime as needed.  11/17/19   [provider]    Physical Exam: Vitals:   03/09/20  1734 03/09/20 1735 03/09/20 1800  BP: (!) 133/92  (!) 125/106  Pulse: 100  92  Resp: (!) 28  14  Temp: 98.5 F (36.9 C)    TempSrc: Oral    SpO2: 100%  97%  Weight:  71.7 kg   Height:  5' 8" (1.727 m)      Vitals:   03/09/20 1734 03/09/20 1735 03/09/20 1800  BP: (!) 133/92  (!) 125/106  Pulse: 100  92  Resp: (!) 28  14  Temp: 98.5 F (36.9 C)    TempSrc: Oral    SpO2: 100%  97%  Weight:  71.7 kg   Height:  5' 8" (1.727 m)     Constitutional: Alert and awake, oriented x3, not in any acute distress. Eyes: PERLA, EOMI, irises appear normal, anicteric sclera,  ENMT: external ears and nose appear normal, normal hearing             Lips appears  normal, oropharynx mucosa, tongue, posterior pharynx appear normal  Neck: neck appears normal, no masses, normal ROM, no thyromegaly, no JVD  CVS: S1-S2 clear, no murmur rubs or gallops,  , no carotid bruits, pedal pulses palpable, No LE edema Respiratory:  clear to auscultation bilaterally, no wheezing, rales or rhonchi. Respiratory effort normal. No accessory muscle use.  Abdomen: soft nontender, nondistended, normal bowel sounds, no hepatosplenomegaly, no hernias Musculoskeletal: : no cyanosis, clubbing , no contractures or atrophy Neuro: Cranial nerves II-XII intact, sensation, reflexes normal, strength Psych: judgement and insight appear normal, stable mood and affect,  Skin: no rashes or lesions or ulcers, no induration or nodules   Labs on Admission: I have personally reviewed following labs and imaging studies  CBC: Recent Labs  Lab 03/09/20 1739  WBC 5.9  NEUTROABS 3.2  HGB 12.1  HCT 34.7*  MCV 108.8*  PLT 170*   Basic Metabolic Panel: Recent Labs  Lab 03/09/20 1739  NA 140  K 3.6  CL 102  CO2 25  GLUCOSE 98  BUN 12  CREATININE 0.37*  CALCIUM 8.6*   GFR: Estimated Creatinine Clearance: 89.6 mL/min (A) (by C-G formula based on SCr of 0.37 mg/dL (L)). Liver Function Tests: Recent Labs  Lab 03/09/20 1739  AST 265*  ALT 50*  ALKPHOS 149*  BILITOT 1.1  PROT 6.5  ALBUMIN 3.2*   No results for input(s): LIPASE, AMYLASE in the last 168 hours. No results for input(s): AMMONIA in the last 168 hours. Coagulation Profile: No results for input(s): INR, PROTIME in the last 168 hours. Cardiac Enzymes: No results for input(s): CKTOTAL, CKMB, CKMBINDEX, TROPONINI in the last 168 hours. BNP (last 3 results) No results for input(s): PROBNP in the last 8760 hours. HbA1C: No results for input(s): HGBA1C in the last 72 hours. CBG: No results for input(s): GLUCAP in the last 168 hours. Lipid Profile: No results for input(s): CHOL, HDL, LDLCALC, TRIG, CHOLHDL,  LDLDIRECT in the last 72 hours. Thyroid Function Tests: No results for input(s): TSH, T4TOTAL, FREET4, T3FREE, THYROIDAB in the last 72 hours. Anemia Panel: No results for input(s): VITAMINB12, FOLATE, FERRITIN, TIBC, IRON, RETICCTPCT in the last 72 hours. Urine analysis:    Component Value Date/Time   COLORURINE AMBER (A) 05/13/2018 1421   APPEARANCEUR CLOUDY (A) 05/13/2018 1421   APPEARANCEUR Hazy 06/20/2014 0803   LABSPEC 1.018 05/13/2018 1421   LABSPEC 1.004 06/20/2014 0803   PHURINE 5.0 05/13/2018 1421   GLUCOSEU NEGATIVE 05/13/2018 1421   GLUCOSEU Negative 06/20/2014 0803   HGBUR NEGATIVE 05/13/2018  [place] 05/13/2018 1421   BILIRUBINUR Negative 06/20/2014 0803   KETONESUR 20 (A) 05/13/2018 1421   PROTEINUR 100 (A) 05/13/2018 1421   NITRITE NEGATIVE 05/13/2018 1421   LEUKOCYTESUR NEGATIVE 05/13/2018 1421   LEUKOCYTESUR Negative 06/20/2014 0803    Radiological Exams on Admission: CT Head Wo Contrast  Result Date: 03/09/2020 CLINICAL DATA:  Seizure EXAM: CT HEAD WITHOUT CONTRAST TECHNIQUE: Contiguous axial images were obtained from the base of the skull through the vertex without intravenous contrast. COMPARISON:  CT brain 01/03/2018 FINDINGS: Brain: No acute territorial infarction, hemorrhage or intracranial mass. The ventricles are nonenlarged. Mild cortical volume loss somewhat prominent for age. Vascular: No hyperdense vessel or unexpected calcification. Skull: Normal. Negative for fracture or focal lesion. Sinuses/Orbits: No acute finding. Other: None IMPRESSION: Negative.  No CT evidence for acute intracranial abnormality Electronically Signed   By: Donavan Foil M.D.   On: 03/09/2020 18:09    EKG: Independently reviewed.   Assessment/Plan Principal Problem:   Alcohol withdrawal seizure without complication (HCC)   Transaminitis   Alcohol use disorder, severe, dependence (HCC) -Blood alcohol level above 200 with elevated AST/ALT to 250/50 and  elevated alk phos -CIWA withdrawal protocol -Seizure fall and aspiration precautions -Thiamine folate and multivitamin -Transitional care consult to set up outpatient referral to assist with alcohol abstinence  History of pancreatitis -No acute issues    Hypothyroidism -Continue levothyroxine    DVT prophylaxis: Lovenox  Code Status: full code  Family Communication:  none  Disposition Plan: Back to previous home environment Consults called: none  Status:obs    Athena Masse MD [facility]     03/09/2020, 7:53 PM

## 2020-03-09 NOTE — ED Notes (Signed)
Pt states coming in for seizures after drinking. Pt states she is trying to cut back, but last drink was today. Pt states pain to the legs only.

## 2020-03-09 NOTE — ED Triage Notes (Signed)
Pt arrived today from home via [facility]. Pt called EMS because she had a seizure, pt states she bit her tongue and voided during the episode. Pt has hx of ETOH abuse and is cutting back trying to quit, pt drank 1/2 fifth of liquor instead of a full fifth. Pt VS were stable for EMS and BG was 109.

## 2020-03-09 NOTE — ED Notes (Signed)
Hand off of care and report given to Mathis Dad , RN

## 2020-03-09 NOTE — ED Provider Notes (Signed)
[facility] Emergency Department Provider Note   ____________________________________________   First MD Initiated Contact with Patient 03/09/20 1729     (approximate)  I have reviewed the triage vital signs and the nursing notes.   HISTORY  Chief Complaint Seizures    HPI Terri Mann is a 44 y.o. female with past medical history of asthma, alcohol abuse, and pancreatitis who presents to the ED following seizure.  Patient reports that she had an unwitnessed seizure about 45 minutes prior to arrival.  She believes she lost consciousness and thinks it was due to a seizure as when she woke up she had bit her tongue and urinated on herself.  She states this would be about her fifth seizure episode in about the past 3 months, has had prior witnessed tonic-clonic seizures but has never been evaluated for this medically.  She had never had any seizures prior to a couple of months ago, does state that she has been trying to cut back on her drinking prior to the onset of seizures.  She typically drinks about 1/5 of liquor daily but has only had about half of that so far today with her last drink coming around lunchtime.  She denies any headache, neck pain, fever, cough, chest pain, shortness of breath, vomiting, or diarrhea.  She denies any drug use.        Past Medical History:  Diagnosis Date  . Asthma   . Chronic back pain   . GERD (gastroesophageal reflux disease)   . Pancreatitis   . Thyroid disease     Patient Active Problem List   Diagnosis Date Noted  . Alcohol abuse 10/01/2018  . Substance induced mood disorder ([place]) 10/01/2018  . Asthma 01/04/2018  . GERD (gastroesophageal reflux disease) 01/04/2018  . Hypothyroidism 01/04/2018  . Alcohol withdrawal seizure without complication ([place]) 82/95/6213  . Alcohol withdrawal seizure ([place]) 01/04/2018  . Acute pancreatitis 08/11/2016  . Pancreatic insufficiency 08/10/2016    Past Surgical History:   Procedure Laterality Date  . tubial ligation       Prior to Admission medications   Medication Sig Start Date End Date Taking? Authorizing Provider  amLODipine (NORVASC) 5 MG tablet Take 5 mg by mouth daily. 03/11/18   [provider]  chlordiazePOXIDE (LIBRIUM) 25 MG capsule Day 1: take 50 mg (2 capsules) in AM, afternoon, and evening. Day 2: take 50 mg (2 capsules) AM and PM. Day 3: take 50 mg (2 capsules) in AM 12/16/19   Lilia Pro., MD  FLOVENT HFA 220 MCG/ACT inhaler Inhale 1 puff into the lungs 2 (two) times daily as needed.     [provider]  levothyroxine (SYNTHROID) 100 MCG tablet Take 1 tablet (100 mcg total) by mouth daily. 08/25/17 12/16/19  Arta Silence, MD  pantoprazole (PROTONIX) 40 MG tablet Take 1 tablet (40 mg total) by mouth daily. Patient taking differently: Take 40 mg by mouth 2 (two) times daily as needed (Acid reflux).  12/30/16 12/16/19  Merlyn Lot, MD  STIOLTO RESPIMAT 2.5-2.5 MCG/ACT AERS Inhale 2 puffs into the lungs daily as needed.  09/07/18   [provider]  traZODone (DESYREL) 50 MG tablet Take 1 tablet by mouth at bedtime as needed.  11/17/19   [provider]    Allergies Patient has no known allergies.  Family History  Problem Relation Age of Onset  . Thyroid disease Mother   . Heart attack Father   . Thyroid disease Sister   .  Liver disease Brother     Social History Social History   Tobacco Use  . Smoking status: Current Some Day Smoker    Packs/day: 2.00    Types: Cigarettes  . Smokeless tobacco: Never Used  Substance Use Topics  . Alcohol use: Yes    Alcohol/week: 140.0 standard drinks    Types: 140 Shots of liquor per week  . Drug use: No    Review of Systems  Constitutional: No fever/chills Eyes: No visual changes. ENT: No sore throat. Cardiovascular: Denies chest pain. Respiratory: Denies shortness of breath. Gastrointestinal: No abdominal pain.  No nausea, no vomiting.  No  diarrhea.  No constipation. Genitourinary: Negative for dysuria. Musculoskeletal: Negative for back pain. Skin: Negative for rash. Neurological: Negative for headaches, focal weakness or numbness.  Positive for seizure.  ____________________________________________   PHYSICAL EXAM:  VITAL SIGNS: ED Triage Vitals  Enc Vitals Group     BP      Pulse      Resp      Temp      Temp src      SpO2      Weight      Height      Head Circumference      Peak Flow      Pain Score      Pain Loc      Pain Edu?      Excl. in GC?     Constitutional: Alert and oriented. Eyes: Conjunctivae are normal. Head: Atraumatic. Nose: No congestion/rhinnorhea. Mouth/Throat: Mucous membranes are moist.  No tongue lacerations noted. Neck: Normal ROM Cardiovascular: Normal rate, regular rhythm. Grossly normal heart sounds. Respiratory: Normal respiratory effort.  No retractions. Lungs CTAB. Gastrointestinal: Soft and nontender. No distention. Genitourinary: deferred Musculoskeletal: No lower extremity tenderness nor edema. Neurologic:  Normal speech and language. No gross focal neurologic deficits are appreciated.  Tremulous with mild tongue fasciculations. Skin:  Skin is warm, dry and intact. No rash noted. Psychiatric: Mood and affect are normal. Speech and behavior are normal.  ____________________________________________   LABS (all labs ordered are listed, but only abnormal results are displayed)  Labs Reviewed  COMPREHENSIVE METABOLIC PANEL - Abnormal; Notable for the following components:      Result Value   Creatinine, Ser 0.37 (*)    Calcium 8.6 (*)    Albumin 3.2 (*)    AST 265 (*)    ALT 50 (*)    Alkaline Phosphatase 149 (*)    All other components within normal limits  CBC WITH DIFFERENTIAL/PLATELET - Abnormal; Notable for the following components:   RBC 3.19 (*)    HCT 34.7 (*)    MCV 108.8 (*)    MCH 37.9 (*)    RDW 15.7 (*)    Platelets 134 (*)    All other  components within normal limits  ETHANOL - Abnormal; Notable for the following components:   Alcohol, Ethyl (B) 231 (*)    All other components within normal limits  RESPIRATORY PANEL BY RT PCR (FLU A&B, COVID)   ____________________________________________  EKG  ED ECG REPORT I, Chesley Noon, the attending physician, personally viewed and interpreted this ECG.   Date: 03/09/2020  EKG Time: 17:31  Rate: 99  Rhythm: normal sinus rhythm  Axis: Normal  Intervals:none  ST&T Change: None   PROCEDURES  Procedure(s) performed (including Critical Care):  Procedures   ____________________________________________   INITIAL IMPRESSION / ASSESSMENT AND PLAN / ED COURSE       44 year old  female with history of alcohol abuse and pancreatitis presents to the ED following unwitnessed seizure episode about 45 minutes prior to arrival.  She is now awake and alert with no focal neurologic deficits and no signs of head trauma.  Given she has never been evaluated for this previously, we will further assess with CT of her head, however most likely explanation for her seizure episode seems to be alcohol withdrawal given her recent cut back.  We will check labs, hydrate with IV fluids, and give dose of Ativan to treat her withdrawal symptoms.  Patient reports withdrawal symptoms are improved, no longer as tachycardic or tremulous.  Her CT head is negative for acute process, labs remarkable for transaminitis likely related to her alcohol consumption.  Due to multiple seizures in the setting of alcohol withdrawal, we will admit for further observation.  Case discussed with hospitalist for admission.      ____________________________________________   FINAL CLINICAL IMPRESSION(S) / ED DIAGNOSES  Final diagnoses:  Alcohol withdrawal seizure without complication (HCC)  Alcohol abuse     ED Discharge Orders    None       Note:  This document was prepared using Dragon voice recognition  software and may include unintentional dictation errors.   Chesley Noon, MD 03/09/20 [phone]

## 2020-03-09 NOTE — ED Notes (Signed)
Pt trx to ct  

## 2020-03-10 ENCOUNTER — Encounter: Payer: Self-pay | Admitting: Internal Medicine

## 2020-03-10 DIAGNOSIS — F10230 Alcohol dependence with withdrawal, uncomplicated: Secondary | ICD-10-CM

## 2020-03-10 DIAGNOSIS — R569 Unspecified convulsions: Secondary | ICD-10-CM | POA: Diagnosis not present

## 2020-03-10 LAB — HIV ANTIBODY (ROUTINE TESTING W REFLEX): HIV Screen 4th Generation wRfx: NONREACTIVE

## 2020-03-10 MED ORDER — LORAZEPAM 2 MG PO TABS: 0.0000 mg | ORAL_TABLET | Freq: Two times a day (BID) | ORAL | Status: DC

## 2020-03-10 MED ORDER — LORAZEPAM 2 MG PO TABS
0.0000 mg | ORAL_TABLET | Freq: Four times a day (QID) | ORAL | Status: DC
  Administered 2020-03-10: 14:00:00 2 mg via ORAL
  Administered 2020-03-10: 4 mg via ORAL
  Filled 2020-03-10: qty 1
  Filled 2020-03-10: qty 2

## 2020-03-10 NOTE — Progress Notes (Signed)
Pharmacist notified pt is tolerating PO Ativan.

## 2020-03-10 NOTE — Discharge Summary (Signed)
Physician Discharge Summary  JAMEYA PONTIFF XNA:[id number] DOB: 07/23/1976 DOA: 03/09/2020  PCP: System, Pcp Not In  Admit date: 03/09/2020 Discharge date: 03/10/2020  Recommendations for Outpatient Follow-up:  1. Patient left AMA.   Discharge Diagnoses: Principal diagnosis is #1 1. Alcohol withdrawal seizure 2. Alcoholic hepatitis 3. History of pancreatitis 4. Hypothyroidism  Discharge Condition: serious  Disposition: AMA  Diet recommendation: AMA  Filed Weights   03/09/20 1735  Weight: 71.7 kg    History of present illness: Terri Mann is a 44 y.o. female with medical history significant for hypothyroidism, history of pancreatitis and heavy alcohol abuse up to fifth of liquor daily who has been trying over the past few months to cut back who presents to the emergency room following an unwitnessed seizure with urinary incontinence and tongue biting.  Patient states that this is her fifth such episode in the past 3 months that she has been trying to cut back.  She has not come to the emergency room previously.  States that today she drank just half of her usual amount.  She denies any headache or neck pain, fever or weakness in the extremity.  Has not been recently ill and denies cough or shortness of breath, chest pain, nausea vomiting or diarrhea  ED Course: By arrival in the emergency room she was awake and alert.  Blood alcohol level was 231.  She had a AST to 65 and ALT 50, alk phos 149.  Other blood work mostly unremarkable.  Head CT showed no acute intracranial abnormality.  Hospitalist consulted for admission.  Hospital Course:  The patient was admitted to a telemetry bed on a CIWA protocol and seizure precautions. She had no further seizures. She did complain of a headache. This afternoon I received a communication from nursing that the patient intended to leave. I called the patient and explained to her that alcohol withdrawal can be fatal. She had told me that it was her  intention to stop drinking when she got home. I strongly urged her to remain inpatient until she was out of withdrawal when she could safely be discharged with a plan in place for alcohol rehab and follow up. She stated that her family had "made her home safe for her" and that she was going to leave. I again explained that there was nothing that her family could do to make this process safe for her. She again expressed her intent to leave AMA.  Today's assessment: S: The patient was awake, alert, and oriented x 3. She complained of a headache.  O: Vitals:  Vitals:   03/10/20 0618 03/10/20 0818  BP: 127/75 131/85  Pulse: 99 96  Resp:  18  Temp: 98.2 F (36.8 C) 98.1 F (36.7 C)  SpO2: 100% 99%   Exam:  Constitutional:  . The patient is awake, alert, and oriented x 3. She is complaining of pain from a headache. Respiratory:  . No increased work of breathing. . No wheezes, rales, or rhonchi . No tactile fremitus Cardiovascular:  . Regular rate and rhythm . No murmurs, ectopy, or gallups. . No lateral PMI. No thrills. Abdomen:  . Abdomen is soft, non-tender, non-distended . No hernias, masses, or organomegaly . Normoactive bowel sounds.  Musculoskeletal:  . No cyanosis, clubbing, or edema Skin:  . No rashes, lesions, ulcers . palpation of skin: no induration or nodules Neurologic:  . CN 2-12 intact . Sensation all 4 extremities intact Psychiatric:  . Mental status o Mood, affect appropriate  o Orientation to person, place, time  . judgment and insight appear intact  Discharge Instructions  Patient left AMA.   No Known Allergies  The results of significant diagnostics from this hospitalization (including imaging, microbiology, ancillary and laboratory) are listed below for reference.    Significant Diagnostic Studies: CT Head Wo Contrast  Result Date: 03/09/2020 CLINICAL DATA:  Seizure EXAM: CT HEAD WITHOUT CONTRAST TECHNIQUE: Contiguous axial images were obtained  from the base of the skull through the vertex without intravenous contrast. COMPARISON:  CT brain 01/03/2018 FINDINGS: Brain: No acute territorial infarction, hemorrhage or intracranial mass. The ventricles are nonenlarged. Mild cortical volume loss somewhat prominent for age. Vascular: No hyperdense vessel or unexpected calcification. Skull: Normal. Negative for fracture or focal lesion. Sinuses/Orbits: No acute finding. Other: None IMPRESSION: Negative.  No CT evidence for acute intracranial abnormality Electronically Signed   By: Donavan Foil M.D.   On: 03/09/2020 18:09    Microbiology: Recent Results (from the past 240 hour(s))  Respiratory Panel by RT PCR (Flu A&B, Covid) - Nasopharyngeal Swab     Status: None   Collection Time: 03/09/20  7:30 PM   Specimen: Nasopharyngeal Swab  Result Value Ref Range Status   SARS Coronavirus 2 by RT PCR NEGATIVE NEGATIVE Final    Comment: (NOTE) SARS-CoV-2 target nucleic acids are NOT DETECTED. The SARS-CoV-2 RNA is generally detectable in upper respiratoy specimens during the acute phase of infection. The lowest concentration of SARS-CoV-2 viral copies this assay can detect is 131 copies/mL. A negative result does not preclude SARS-Cov-2 infection and should not be used as the sole basis for treatment or other patient management decisions. A negative result may occur with  improper specimen collection/handling, submission of specimen other than nasopharyngeal swab, presence of viral mutation(s) within the areas targeted by this assay, and inadequate number of viral copies (<131 copies/mL). A negative result must be combined with clinical observations, patient history, and epidemiological information. The expected result is Negative. Fact Sheet for Patients:  PinkCheek.be Fact Sheet for Healthcare Providers:  GravelBags.it This test is not yet ap proved or cleared by the [place] FDA  and  has been authorized for detection and/or diagnosis of SARS-CoV-2 by FDA under an Emergency Use Authorization (EUA). This EUA will remain  in effect (meaning this test can be used) for the duration of the COVID-19 declaration under Section 564(b)(1) of the Act, 21 U.S.C. section 360bbb-3(b)(1), unless the authorization is terminated or revoked sooner.    Influenza A by PCR NEGATIVE NEGATIVE Final   Influenza B by PCR NEGATIVE NEGATIVE Final    Comment: (NOTE) The Xpert Xpress SARS-CoV-2/FLU/RSV assay is intended as an aid in  the diagnosis of influenza from Nasopharyngeal swab specimens and  should not be used as a sole basis for treatment. Nasal washings and  aspirates are unacceptable for Xpert Xpress SARS-CoV-2/FLU/RSV  testing. Fact Sheet for Patients: PinkCheek.be Fact Sheet for Healthcare Providers: GravelBags.it This test is not yet approved or cleared by the [place] FDA and  has been authorized for detection and/or diagnosis of SARS-CoV-2 by  FDA under an Emergency Use Authorization (EUA). This EUA will remain  in effect (meaning this test can be used) for the duration of the  Covid-19 declaration under Section 564(b)(1) of the Act, 21  U.S.C. section 360bbb-3(b)(1), unless the authorization is  terminated or revoked. Performed at [facility], [address] [place], [place] [address]      Labs: Basic Metabolic Panel: Recent Labs  Lab 03/09/20 1739  NA 140  K 3.6  CL 102  CO2 25  GLUCOSE 98  BUN 12  CREATININE 0.37*  CALCIUM 8.6*  MG 1.7  PHOS 3.4   Liver Function Tests: Recent Labs  Lab 03/09/20 1739  AST 265*  ALT 50*  ALKPHOS 149*  BILITOT 1.1  PROT 6.5  ALBUMIN 3.2*   No results for input(s): LIPASE, AMYLASE in the last 168 hours. No results for input(s): AMMONIA in the last 168 hours. CBC: Recent Labs  Lab 03/09/20 1739  WBC 5.9  NEUTROABS 3.2  HGB 12.1  HCT  34.7*  MCV 108.8*  PLT 134*   Cardiac Enzymes: No results for input(s): CKTOTAL, CKMB, CKMBINDEX, TROPONINI in the last 168 hours. BNP: BNP (last 3 results) No results for input(s): BNP in the last 8760 hours.  ProBNP (last 3 results) No results for input(s): PROBNP in the last 8760 hours.  CBG: No results for input(s): GLUCAP in the last 168 hours.  Principal Problem:   Alcohol withdrawal seizure without complication (HCC) Active Problems:   Pancreatic insufficiency   Hypothyroidism   Alcohol withdrawal seizure ([place])   Transaminitis   Alcohol use disorder, severe, dependence ([place])   Time coordinating discharge: 38 minutes.  Signed:        Blessing Zaucha, DO [facility]  03/10/2020, 3:05 PM

## 2020-03-24 ENCOUNTER — Ambulatory Visit
Admit: 2020-03-24 | Discharge: 2020-03-27 | Disposition: A | Discharge status: Home / Self Care | Payer: MEDICAID | Admitting: Internal Medicine

## 2020-03-27 MED ORDER — LEVOTHYROXINE 100 MCG TABLET
ORAL_TABLET | Freq: Every day | ORAL | 1 refills | 30 days | Status: CP
Start: 2020-03-27 — End: 2020-05-26

## 2020-03-27 MED ORDER — SULFAMETHOXAZOLE 800 MG-TRIMETHOPRIM 160 MG TABLET
ORAL_TABLET | Freq: Two times a day (BID) | ORAL | 0 refills | 4 days | Status: CP
Start: 2020-03-27 — End: 2020-03-31
  Filled 2020-03-27: qty 8, 4d supply, fill #0

## 2020-03-27 MED FILL — CHLORDIAZEPOXIDE 25 MG CAPSULE: 6 days supply | Qty: 12 | Fill #0 | Status: AC

## 2020-03-27 MED FILL — SULFAMETHOXAZOLE 800 MG-TRIMETHOPRIM 160 MG TABLET: 4 days supply | Qty: 8 | Fill #0 | Status: AC

## 2020-03-28 MED ORDER — CHLORDIAZEPOXIDE 25 MG CAPSULE
ORAL_CAPSULE | 0 refills | 0 days | Status: CP
Start: 2020-03-28 — End: ?
  Filled 2020-03-27: qty 12, 6d supply, fill #0

## 2020-07-11 ENCOUNTER — Ambulatory Visit: Admit: 2020-07-11 | Discharge: 2020-07-13 | Disposition: A | Discharge status: Home / Self Care | Payer: MEDICAID

## 2020-07-13 MED ORDER — ONDANSETRON 4 MG DISINTEGRATING TABLET
ORAL_TABLET | Freq: Two times a day (BID) | ORAL | 0 refills | 5.00000 days | Status: CP | PRN
Start: 2020-07-13 — End: 2020-08-12
  Filled 2020-07-13: qty 10, 5d supply, fill #0

## 2020-07-13 MED ORDER — CEPHALEXIN 500 MG CAPSULE
ORAL_CAPSULE | Freq: Four times a day (QID) | ORAL | 0 refills | 6.00000 days | Status: CP
Start: 2020-07-13 — End: 2020-07-19
  Filled 2020-07-13: qty 24, 6d supply, fill #0

## 2020-07-13 MED ORDER — LEVOTHYROXINE 100 MCG TABLET
ORAL_TABLET | Freq: Every day | ORAL | 0 refills | 30.00000 days | Status: CP
Start: 2020-07-13 — End: 2020-08-12
  Filled 2020-07-13: qty 30, 30d supply, fill #0

## 2020-07-13 MED ORDER — NICOTINE 21 MG/24 HR DAILY TRANSDERMAL PATCH
MEDICATED_PATCH | Freq: Every day | TRANSDERMAL | 0 refills | 28.00000 days | Status: CP
Start: 2020-07-13 — End: ?
  Filled 2020-07-13: qty 28, 28d supply, fill #0

## 2020-07-13 MED FILL — CEPHALEXIN 500 MG CAPSULE: 6 days supply | Qty: 24 | Fill #0 | Status: AC

## 2020-07-13 MED FILL — VITAMIN B-1 100 MG TABLET: 110 days supply | Qty: 110 | Fill #0 | Status: AC

## 2020-07-13 MED FILL — NICOTINE 21 MG/24 HR DAILY TRANSDERMAL PATCH: 28 days supply | Qty: 28 | Fill #0 | Status: AC

## 2020-07-13 MED FILL — VITAMIN B-1 100 MG TABLET: ORAL | 110 days supply | Qty: 110 | Fill #0

## 2020-07-13 MED FILL — LEVOTHYROXINE 100 MCG TABLET: 30 days supply | Qty: 30 | Fill #0 | Status: AC

## 2020-07-13 MED FILL — ONDANSETRON 4 MG DISINTEGRATING TABLET: 5 days supply | Qty: 10 | Fill #0 | Status: AC

## 2020-07-15 MED ORDER — THIAMINE HCL (VITAMIN B1) 100 MG TABLET
ORAL_TABLET | Freq: Every day | ORAL | 0 refills | 110.00000 days | Status: CP
Start: 2020-07-15 — End: 2021-07-15

## 2020-10-27 ENCOUNTER — Ambulatory Visit
Admit: 2020-10-27 | Discharge: 2020-10-29 | Discharge status: Against Medical Advice | Payer: MEDICAID | Admitting: Student in an Organized Health Care Education/Training Program

## 2020-10-27 DIAGNOSIS — F10220 Alcohol dependence with intoxication, uncomplicated: Principal | ICD-10-CM

## 2020-10-27 DIAGNOSIS — F10230 Alcohol dependence with withdrawal, uncomplicated: Principal | ICD-10-CM

## 2020-10-27 DIAGNOSIS — Z6829 Body mass index (BMI) 29.0-29.9, adult: Principal | ICD-10-CM

## 2020-10-27 DIAGNOSIS — K7030 Alcoholic cirrhosis of liver without ascites: Principal | ICD-10-CM

## 2020-10-27 DIAGNOSIS — D7589 Other specified diseases of blood and blood-forming organs: Principal | ICD-10-CM

## 2020-10-27 DIAGNOSIS — E860 Dehydration: Principal | ICD-10-CM

## 2020-10-27 DIAGNOSIS — Z5329 Procedure and treatment not carried out because of patient's decision for other reasons: Principal | ICD-10-CM

## 2020-10-27 DIAGNOSIS — E663 Overweight: Principal | ICD-10-CM

## 2020-10-27 DIAGNOSIS — Z79899 Other long term (current) drug therapy: Principal | ICD-10-CM

## 2020-10-27 DIAGNOSIS — K7460 Unspecified cirrhosis of liver: Principal | ICD-10-CM

## 2020-10-27 DIAGNOSIS — Z20822 Contact with and (suspected) exposure to covid-19: Principal | ICD-10-CM

## 2020-10-27 DIAGNOSIS — F1020 Alcohol dependence, uncomplicated: Principal | ICD-10-CM

## 2020-10-27 DIAGNOSIS — F17210 Nicotine dependence, cigarettes, uncomplicated: Principal | ICD-10-CM

## 2020-10-27 DIAGNOSIS — D6959 Other secondary thrombocytopenia: Principal | ICD-10-CM

## 2020-10-27 DIAGNOSIS — F1010 Alcohol abuse, uncomplicated: Principal | ICD-10-CM

## 2020-10-27 DIAGNOSIS — F10920 Alcohol use, unspecified with intoxication, uncomplicated: Principal | ICD-10-CM

## 2020-10-27 DIAGNOSIS — K760 Fatty (change of) liver, not elsewhere classified: Principal | ICD-10-CM

## 2020-10-27 DIAGNOSIS — K219 Gastro-esophageal reflux disease without esophagitis: Principal | ICD-10-CM

## 2020-10-27 DIAGNOSIS — E039 Hypothyroidism, unspecified: Principal | ICD-10-CM

## 2020-10-27 DIAGNOSIS — I10 Essential (primary) hypertension: Principal | ICD-10-CM

## 2020-10-27 DIAGNOSIS — Z9851 Tubal ligation status: Principal | ICD-10-CM

## 2020-10-28 DIAGNOSIS — K219 Gastro-esophageal reflux disease without esophagitis: Principal | ICD-10-CM

## 2020-10-28 DIAGNOSIS — F10220 Alcohol dependence with intoxication, uncomplicated: Principal | ICD-10-CM

## 2020-10-28 DIAGNOSIS — K760 Fatty (change of) liver, not elsewhere classified: Principal | ICD-10-CM

## 2020-10-28 DIAGNOSIS — Z79899 Other long term (current) drug therapy: Principal | ICD-10-CM

## 2020-10-28 DIAGNOSIS — Z20822 Contact with and (suspected) exposure to covid-19: Principal | ICD-10-CM

## 2020-10-28 DIAGNOSIS — D6959 Other secondary thrombocytopenia: Principal | ICD-10-CM

## 2020-10-28 DIAGNOSIS — E663 Overweight: Principal | ICD-10-CM

## 2020-10-28 DIAGNOSIS — Z9851 Tubal ligation status: Principal | ICD-10-CM

## 2020-10-28 DIAGNOSIS — D7589 Other specified diseases of blood and blood-forming organs: Principal | ICD-10-CM

## 2020-10-28 DIAGNOSIS — Z5329 Procedure and treatment not carried out because of patient's decision for other reasons: Principal | ICD-10-CM

## 2020-10-28 DIAGNOSIS — E860 Dehydration: Principal | ICD-10-CM

## 2020-10-28 DIAGNOSIS — Z6829 Body mass index (BMI) 29.0-29.9, adult: Principal | ICD-10-CM

## 2020-10-28 DIAGNOSIS — E039 Hypothyroidism, unspecified: Principal | ICD-10-CM

## 2020-10-28 DIAGNOSIS — F17210 Nicotine dependence, cigarettes, uncomplicated: Principal | ICD-10-CM

## 2020-10-28 DIAGNOSIS — F10230 Alcohol dependence with withdrawal, uncomplicated: Principal | ICD-10-CM

## 2020-10-28 DIAGNOSIS — I10 Essential (primary) hypertension: Principal | ICD-10-CM

## 2020-10-28 DIAGNOSIS — K7030 Alcoholic cirrhosis of liver without ascites: Principal | ICD-10-CM

## 2020-10-29 MED ORDER — LEVOTHYROXINE 100 MCG TABLET
ORAL_TABLET | Freq: Every day | ORAL | 0 refills | 30.00000 days | Status: CP
Start: 2020-10-29 — End: 2020-12-25

## 2020-10-29 MED ORDER — ACETAMINOPHEN 500 MG TABLET
ORAL_TABLET | Freq: Four times a day (QID) | ORAL | 0 refills | 8 days | Status: CP | PRN
Start: 2020-10-29 — End: ?

## 2020-10-29 MED ORDER — THIAMINE HCL (VITAMIN B1) 100 MG TABLET
ORAL_TABLET | Freq: Every day | ORAL | 11 refills | 30 days | Status: CP
Start: 2020-10-29 — End: 2021-10-29

## 2020-10-29 MED ORDER — LORAZEPAM 1 MG TABLET
ORAL_TABLET | 0 refills | 0 days | Status: CP
Start: 2020-10-29 — End: ?

## 2020-10-29 MED ORDER — PANTOPRAZOLE 40 MG TABLET,DELAYED RELEASE
ORAL_TABLET | Freq: Every day | ORAL | 0 refills | 30.00000 days | Status: CP
Start: 2020-10-29 — End: 2020-12-25

## 2020-10-30 MED ORDER — FOLIC ACID 1 MG TABLET
ORAL_TABLET | Freq: Every day | ORAL | 11 refills | 30 days | Status: CP
Start: 2020-10-30 — End: 2021-10-30

## 2020-10-30 MED ORDER — NICOTINE 14 MG/24 HR DAILY TRANSDERMAL PATCH
MEDICATED_PATCH | Freq: Every day | TRANSDERMAL | 0 refills | 28 days | Status: CP
Start: 2020-10-30 — End: ?

## 2020-12-25 ENCOUNTER — Ambulatory Visit: Admit: 2020-12-25 | Discharge: 2020-12-25 | Payer: MEDICAID

## 2020-12-25 DIAGNOSIS — K7031 Alcoholic cirrhosis of liver with ascites: Principal | ICD-10-CM

## 2020-12-25 DIAGNOSIS — R188 Other ascites: Principal | ICD-10-CM

## 2020-12-25 DIAGNOSIS — F1020 Alcohol dependence, uncomplicated: Principal | ICD-10-CM

## 2020-12-25 DIAGNOSIS — K7011 Alcoholic hepatitis with ascites: Principal | ICD-10-CM

## 2020-12-25 DIAGNOSIS — K766 Portal hypertension: Principal | ICD-10-CM

## 2020-12-25 DIAGNOSIS — Z79899 Other long term (current) drug therapy: Principal | ICD-10-CM

## 2020-12-25 DIAGNOSIS — K7460 Unspecified cirrhosis of liver: Principal | ICD-10-CM

## 2020-12-27 DIAGNOSIS — B952 Enterococcus as the cause of diseases classified elsewhere: Principal | ICD-10-CM

## 2020-12-27 DIAGNOSIS — N390 Urinary tract infection, site not specified: Principal | ICD-10-CM

## 2020-12-27 MED ORDER — SULFAMETHOXAZOLE 800 MG-TRIMETHOPRIM 160 MG TABLET
ORAL_TABLET | Freq: Two times a day (BID) | ORAL | 0 refills | 3 days | Status: CP
Start: 2020-12-27 — End: 2020-12-30

## 2021-01-16 ENCOUNTER — Ambulatory Visit
Admit: 2021-01-16 | Discharge: 2021-01-19 | Disposition: A | Discharge status: Home / Self Care | Payer: MEDICAID | Admitting: Hospitalist

## 2021-01-19 MED ORDER — NICOTINE 21 MG/24 HR DAILY TRANSDERMAL PATCH
MEDICATED_PATCH | Freq: Every day | TRANSDERMAL | 0 refills | 28 days | Status: CP | PRN
Start: 2021-01-19 — End: ?

## 2021-01-19 MED ORDER — DICYCLOMINE 10 MG CAPSULE
ORAL_CAPSULE | Freq: Four times a day (QID) | ORAL | 0 refills | 30 days | Status: CP
Start: 2021-01-19 — End: 2021-02-18

## 2021-01-19 MED ORDER — ACETAMINOPHEN 500 MG TABLET
ORAL_TABLET | Freq: Four times a day (QID) | ORAL | 0 refills | 8.00000 days | PRN
Start: 2021-01-19 — End: ?

## 2021-01-19 MED ORDER — MAGNESIUM OXIDE 400 MG (241.3 MG MAGNESIUM) TABLET
ORAL_TABLET | Freq: Two times a day (BID) | ORAL | 0 refills | 90.00000 days | Status: CP
Start: 2021-01-19 — End: 2021-04-19

## 2021-01-20 MED ORDER — MULTIVITAMIN-IRON 9 MG-FOLIC ACID 400 MCG-CALCIUM AND MINERALS TABLET
ORAL_TABLET | Freq: Every day | ORAL | 0 refills | 90 days | Status: CP
Start: 2021-01-20 — End: ?

## 2021-02-01 ENCOUNTER — Encounter: Admit: 2021-02-01 | Discharge: 2021-02-01 | Payer: MEDICAID | Attending: Anesthesiology | Primary: Anesthesiology

## 2021-02-01 ENCOUNTER — Ambulatory Visit: Admit: 2021-02-01 | Discharge: 2021-02-01 | Payer: MEDICAID

## 2021-04-23 ENCOUNTER — Ambulatory Visit: Admit: 2021-04-23 | Discharge: 2021-04-24 | Payer: MEDICAID

## 2021-05-07 ENCOUNTER — Encounter: Payer: Self-pay | Admitting: Advanced Practice Midwife

## 2021-05-07 DIAGNOSIS — K7030 Alcoholic cirrhosis of liver without ascites: Secondary | ICD-10-CM | POA: Insufficient documentation

## 2021-05-07 DIAGNOSIS — F17200 Nicotine dependence, unspecified, uncomplicated: Secondary | ICD-10-CM | POA: Insufficient documentation

## 2021-05-07 DIAGNOSIS — R188 Other ascites: Principal | ICD-10-CM

## 2021-05-07 DIAGNOSIS — K7460 Unspecified cirrhosis of liver: Principal | ICD-10-CM

## 2021-05-07 DIAGNOSIS — K7031 Alcoholic cirrhosis of liver with ascites: Principal | ICD-10-CM

## 2021-05-07 DIAGNOSIS — Z1289 Encounter for screening for malignant neoplasm of other sites: Principal | ICD-10-CM

## 2021-07-06 ENCOUNTER — Ambulatory Visit: Admit: 2021-07-06 | Discharge: 2021-07-07 | Payer: MEDICAID

## 2021-07-06 DIAGNOSIS — Z7185 Vaccine counseling: Principal | ICD-10-CM

## 2021-07-06 DIAGNOSIS — K219 Gastro-esophageal reflux disease without esophagitis: Principal | ICD-10-CM

## 2021-07-06 DIAGNOSIS — M545 Bilateral low back pain without sciatica, unspecified chronicity: Principal | ICD-10-CM

## 2021-07-06 DIAGNOSIS — F419 Anxiety disorder, unspecified: Principal | ICD-10-CM

## 2021-07-06 DIAGNOSIS — E663 Overweight: Principal | ICD-10-CM

## 2021-07-06 DIAGNOSIS — E039 Hypothyroidism, unspecified: Principal | ICD-10-CM

## 2021-07-06 DIAGNOSIS — Z124 Encounter for screening for malignant neoplasm of cervix: Principal | ICD-10-CM

## 2021-07-06 DIAGNOSIS — A630 Anogenital (venereal) warts: Principal | ICD-10-CM

## 2021-07-06 DIAGNOSIS — F32A Anxiety and depression: Principal | ICD-10-CM

## 2021-07-06 DIAGNOSIS — F17200 Nicotine dependence, unspecified, uncomplicated: Principal | ICD-10-CM

## 2021-07-06 DIAGNOSIS — K7031 Alcoholic cirrhosis of liver with ascites: Principal | ICD-10-CM

## 2021-07-06 DIAGNOSIS — I10 Essential (primary) hypertension: Principal | ICD-10-CM

## 2021-07-06 MED ORDER — PANTOPRAZOLE 40 MG TABLET,DELAYED RELEASE
ORAL_TABLET | Freq: Every day | ORAL | 3 refills | 30 days | Status: CP
Start: 2021-07-06 — End: ?

## 2021-07-06 MED ORDER — FLUOXETINE 10 MG CAPSULE
ORAL_CAPSULE | Freq: Every day | ORAL | 1 refills | 30 days | Status: CP
Start: 2021-07-06 — End: 2022-07-06

## 2021-07-06 MED ORDER — NICOTINE 21 MG/24 HR DAILY TRANSDERMAL PATCH
MEDICATED_PATCH | Freq: Every day | TRANSDERMAL | 3 refills | 28 days | Status: CP | PRN
Start: 2021-07-06 — End: ?

## 2021-07-06 MED ORDER — AMLODIPINE 5 MG TABLET
ORAL_TABLET | Freq: Every day | ORAL | 3 refills | 90 days | Status: CP
Start: 2021-07-06 — End: ?

## 2021-07-06 MED ORDER — LIDOCAINE 5 % TOPICAL PATCH
MEDICATED_PATCH | Freq: Two times a day (BID) | TRANSDERMAL | 0 refills | 5 days | Status: CP
Start: 2021-07-06 — End: 2022-07-06

## 2021-07-06 MED ORDER — IMIQUIMOD 5 % TOPICAL CREAM PACKET
PACK | 1 refills | 0 days | Status: CP
Start: 2021-07-06 — End: 2022-07-06

## 2021-07-10 DIAGNOSIS — E039 Hypothyroidism, unspecified: Principal | ICD-10-CM

## 2021-07-10 MED ORDER — LEVOTHYROXINE 100 MCG TABLET
ORAL_TABLET | Freq: Every day | ORAL | 3 refills | 90.00000 days | Status: CP
Start: 2021-07-10 — End: ?

## 2021-07-12 ENCOUNTER — Ambulatory Visit: Admit: 2021-07-12 | Discharge: 2021-07-13 | Payer: MEDICAID

## 2021-07-12 DIAGNOSIS — R188 Other ascites: Principal | ICD-10-CM

## 2021-07-12 DIAGNOSIS — K7460 Unspecified cirrhosis of liver: Principal | ICD-10-CM

## 2021-07-12 DIAGNOSIS — Z1289 Encounter for screening for malignant neoplasm of other sites: Principal | ICD-10-CM

## 2021-07-12 DIAGNOSIS — K7031 Alcoholic cirrhosis of liver with ascites: Principal | ICD-10-CM

## 2021-07-31 DIAGNOSIS — K7031 Alcoholic cirrhosis of liver with ascites: Principal | ICD-10-CM

## 2021-07-31 MED ORDER — FLUOXETINE 10 MG CAPSULE
ORAL_CAPSULE | 1 refills | 0 days | Status: CP
Start: 2021-07-31 — End: ?

## 2021-09-05 ENCOUNTER — Ambulatory Visit: Admit: 2021-09-05 | Discharge: 2021-09-06 | Payer: MEDICAID

## 2021-09-05 DIAGNOSIS — K7460 Unspecified cirrhosis of liver: Principal | ICD-10-CM

## 2021-09-05 DIAGNOSIS — F419 Anxiety disorder, unspecified: Principal | ICD-10-CM

## 2021-09-05 DIAGNOSIS — M25512 Pain in left shoulder: Principal | ICD-10-CM

## 2021-09-05 DIAGNOSIS — R188 Other ascites: Principal | ICD-10-CM

## 2021-09-05 DIAGNOSIS — F32A Anxiety and depression: Principal | ICD-10-CM

## 2021-09-05 DIAGNOSIS — M542 Cervicalgia: Principal | ICD-10-CM

## 2021-09-05 DIAGNOSIS — G8929 Other chronic pain: Principal | ICD-10-CM

## 2021-09-05 DIAGNOSIS — I10 Essential (primary) hypertension: Principal | ICD-10-CM

## 2021-09-05 DIAGNOSIS — E039 Hypothyroidism, unspecified: Principal | ICD-10-CM

## 2021-09-05 MED ORDER — FLUOXETINE 20 MG CAPSULE
ORAL_CAPSULE | Freq: Every day | ORAL | 1 refills | 90 days | Status: CP
Start: 2021-09-05 — End: 2022-09-05

## 2021-09-05 MED ORDER — AMLODIPINE 10 MG TABLET
ORAL_TABLET | Freq: Every day | ORAL | 1 refills | 90 days | Status: CP
Start: 2021-09-05 — End: 2022-09-05

## 2021-10-02 DIAGNOSIS — K219 Gastro-esophageal reflux disease without esophagitis: Principal | ICD-10-CM

## 2021-10-02 MED ORDER — PANTOPRAZOLE 40 MG TABLET,DELAYED RELEASE
ORAL_TABLET | 1 refills | 0 days | Status: CP
Start: 2021-10-02 — End: ?

## 2021-10-17 ENCOUNTER — Ambulatory Visit: Admit: 2021-10-17 | Discharge: 2021-10-18 | Payer: MEDICAID

## 2021-11-01 ENCOUNTER — Ambulatory Visit: Admit: 2021-11-01 | Discharge: 2021-11-02 | Payer: MEDICAID

## 2021-11-01 DIAGNOSIS — L089 Local infection of the skin and subcutaneous tissue, unspecified: Principal | ICD-10-CM

## 2021-11-01 DIAGNOSIS — S0031XA Abrasion of nose, initial encounter: Principal | ICD-10-CM

## 2021-11-01 DIAGNOSIS — K7031 Alcoholic cirrhosis of liver with ascites: Principal | ICD-10-CM

## 2021-11-01 MED ORDER — LACTULOSE 20 GRAM/30 ML ORAL SOLUTION
Freq: Two times a day (BID) | ORAL | 3 refills | 90 days | Status: CP
Start: 2021-11-01 — End: 2022-11-01

## 2021-11-13 DIAGNOSIS — N760 Acute vaginitis: Principal | ICD-10-CM

## 2021-11-13 DIAGNOSIS — F419 Anxiety disorder, unspecified: Principal | ICD-10-CM

## 2021-11-13 DIAGNOSIS — F32A Anxiety and depression: Principal | ICD-10-CM

## 2021-11-13 MED ORDER — FLUCONAZOLE 150 MG TABLET
ORAL_TABLET | Freq: Once | ORAL | 0 refills | 1.00000 days | Status: CP
Start: 2021-11-13 — End: 2021-11-13

## 2021-11-13 MED ORDER — HYDROXYZINE HCL 25 MG TABLET
ORAL_TABLET | Freq: Three times a day (TID) | ORAL | 0 refills | 7 days | Status: CP | PRN
Start: 2021-11-13 — End: ?

## 2021-11-13 MED ORDER — FLUOXETINE 40 MG CAPSULE
ORAL_CAPSULE | Freq: Every day | ORAL | 1 refills | 90.00000 days | Status: CP
Start: 2021-11-13 — End: 2022-11-13

## 2021-11-17 ENCOUNTER — Ambulatory Visit: Admit: 2021-11-17 | Payer: MEDICAID

## 2021-11-28 DIAGNOSIS — K7460 Unspecified cirrhosis of liver: Principal | ICD-10-CM

## 2021-11-28 DIAGNOSIS — R188 Other ascites: Principal | ICD-10-CM

## 2021-11-28 MED ORDER — RIFAXIMIN 550 MG TABLET
ORAL_TABLET | Freq: Two times a day (BID) | ORAL | 11 refills | 30.00000 days | Status: CP
Start: 2021-11-28 — End: 2022-11-28

## 2021-12-03 ENCOUNTER — Ambulatory Visit: Admit: 2021-12-03 | Discharge: 2021-12-04 | Payer: MEDICAID

## 2021-12-04 DIAGNOSIS — A630 Anogenital (venereal) warts: Principal | ICD-10-CM

## 2021-12-04 MED ORDER — IMIQUIMOD 5 % TOPICAL CREAM PACKET
PACK | 1 refills | 0 days | Status: CP
Start: 2021-12-04 — End: 2022-12-04

## 2021-12-06 ENCOUNTER — Ambulatory Visit: Admit: 2021-12-06 | Discharge: 2021-12-07 | Payer: MEDICAID

## 2021-12-06 DIAGNOSIS — F419 Anxiety disorder, unspecified: Principal | ICD-10-CM

## 2021-12-06 DIAGNOSIS — K7460 Unspecified cirrhosis of liver: Principal | ICD-10-CM

## 2021-12-06 DIAGNOSIS — M549 Dorsalgia, unspecified: Principal | ICD-10-CM

## 2021-12-06 DIAGNOSIS — E039 Hypothyroidism, unspecified: Principal | ICD-10-CM

## 2021-12-06 DIAGNOSIS — F32A Anxiety and depression: Principal | ICD-10-CM

## 2021-12-06 DIAGNOSIS — R188 Other ascites: Principal | ICD-10-CM

## 2021-12-06 DIAGNOSIS — F17200 Nicotine dependence, unspecified, uncomplicated: Principal | ICD-10-CM

## 2021-12-12 ENCOUNTER — Other Ambulatory Visit: Payer: Self-pay | Admitting: Radiation Oncology

## 2021-12-12 DIAGNOSIS — Z8739 Personal history of other diseases of the musculoskeletal system and connective tissue: Secondary | ICD-10-CM

## 2021-12-12 DIAGNOSIS — M1990 Unspecified osteoarthritis, unspecified site: Secondary | ICD-10-CM

## 2021-12-18 ENCOUNTER — Telehealth: Admit: 2021-12-18 | Discharge: 2021-12-19 | Payer: MEDICAID | Attending: Mental Health | Primary: Mental Health

## 2021-12-18 DIAGNOSIS — M542 Cervicalgia: Principal | ICD-10-CM

## 2021-12-18 DIAGNOSIS — F411 Generalized anxiety disorder: Principal | ICD-10-CM

## 2021-12-18 DIAGNOSIS — F1021 Alcohol dependence, in remission: Principal | ICD-10-CM

## 2021-12-18 DIAGNOSIS — F332 Major depressive disorder, recurrent severe without psychotic features: Principal | ICD-10-CM

## 2021-12-18 DIAGNOSIS — M549 Dorsalgia, unspecified: Principal | ICD-10-CM

## 2021-12-18 DIAGNOSIS — F32A Anxiety and depression: Principal | ICD-10-CM

## 2021-12-18 DIAGNOSIS — F419 Anxiety disorder, unspecified: Principal | ICD-10-CM

## 2021-12-18 MED ORDER — GABAPENTIN 300 MG CAPSULE
ORAL_CAPSULE | Freq: Every evening | ORAL | 1 refills | 0 days | Status: CP | PRN
Start: 2021-12-18 — End: 2022-12-18

## 2021-12-19 ENCOUNTER — Ambulatory Visit: Admit: 2021-12-19 | Discharge: 2021-12-20 | Payer: MEDICAID

## 2021-12-24 DIAGNOSIS — R188 Other ascites: Principal | ICD-10-CM

## 2021-12-24 DIAGNOSIS — K7460 Unspecified cirrhosis of liver: Principal | ICD-10-CM

## 2021-12-24 MED ORDER — RIFAXIMIN 550 MG TABLET
ORAL_TABLET | Freq: Two times a day (BID) | ORAL | 11 refills | 30 days | Status: CP
Start: 2021-12-24 — End: 2022-12-24
  Filled 2022-01-01: qty 60, 30d supply, fill #0

## 2021-12-25 DIAGNOSIS — R188 Other ascites: Principal | ICD-10-CM

## 2021-12-25 DIAGNOSIS — K7460 Unspecified cirrhosis of liver: Principal | ICD-10-CM

## 2021-12-28 NOTE — Unmapped (Signed)
[facility] Specialty Medication Onboarding    Specialty Medication: XIFAXAN 550 mg Tab (rifAXIMin)  Prior Authorization: Approved   Financial Assistance: No - copay  <$25  Final Copay/Day Supply: $4 / 30    Insurance Restrictions: None     Notes to Pharmacist:     The triage team has completed the benefits investigation and has determined that the patient is able to fill this medication at [facility]. Please contact the patient to complete the onboarding or follow up with the prescribing physician as needed.

## 2021-12-28 NOTE — Unmapped (Unsigned)
Opened in error a severe form called C diff-associated diarrhea (CDAD) may happen. Sometimes this has led to a deadly bowel problem (colitis). CDAD may happen during or a few months after taking antibiotics. Call your doctor right away if you have stomach pain, cramps, or very loose, watery, or bloody stools. Check with your doctor before treating diarrhea.    Monitoring Parameters:   ??? For the prevention of hepatic encephalopathy:  Patient should monitor for changes in mental status.   ??? For IBS-D: Monitor for improvement in symptoms such as a decrease in diarrhea.      Contraindications, Warnings, & Precautions     ??? Superinfection: Prolonged use may result in fungal or bacterial superinfection, including Clostridioides (formerly Clostridium) difficile-associated diarrhea (CDAD) and pseudomembranous colitis; CDAD has been observed >2 months post-antibiotic treatment.  ??? Severe (Child Pugh Class C) Hepatic Impairment: increased systemic exposure with severe hepatic impairment.  ??? Concomitant use with P-glycoprotein (P-gp) inhibitors: P-gp inhibitors may increase systemic exposure of rifaximin.    Drug/Food Interactions     ??? Medication list reviewed in Epic. The patient was instructed to inform the care team before taking any new medications or supplements. No drug interactions identified.   ??? Warfarin: monitor INR and prothrombin time; Dose adjustment of warfarin may be needed to maintain target INR range.    Storage, Handling Precautions, & Disposal     ??? Store this medication at room temperature.  ??? Store in a dry place. Do not store in a bathroom.   ??? Keep all drugs out of the reach of children and pets.  ??? Throw away unused or expired drugs. Do not flush down a toilet or pour down a drain unless you are told to do so. Check with your pharmacist if you have questions about the best way to throw out drugs. There may be drug take-back programs in your area.        Current Medications (including OTC/herbals), Comorbidities and Allergies     Current Outpatient Medications   Medication Sig Dispense Refill   ??? amLODIPine (NORVASC) 10 MG tablet Take 1 tablet (10 mg total) by mouth daily. 90 tablet 1   ??? FLUoxetine (PROZAC) 40 MG capsule Take 1 capsule (40 mg total) by mouth daily. 90 capsule 1   ??? gabapentin (NEURONTIN) 300 MG capsule Take 1 capsule (300 mg total) by mouth at bedtime as needed. 30 capsule 1   ??? hydrOXYzine (ATARAX) 25 MG tablet Take 1 tablet (25 mg total) by mouth every eight (8) hours as needed. 20 tablet 0   ??? imiquimod (ALDARA) 5 % cream Apply to affected area three times weekly 12 packet 1   ??? levothyroxine (SYNTHROID) 100 MCG tablet Take 1 tablet (100 mcg total) by mouth daily. 90 tablet 3   ??? multivitamins, therapeutic with minerals 9 mg iron-400 mcg tablet Take 1 tablet by mouth daily. 90 tablet 0   ??? nicotine (NICODERM CQ) 21 mg/24 hr patch Place 1 patch on the skin daily as needed. 28 patch 3   ??? pantoprazole (PROTONIX) 40 MG tablet TAKE 1 TABLET BY MOUTH EVERY DAY 90 tablet 1   ??? rifAXIMin (XIFAXAN) 550 mg Tab Take 1 tablet (550 mg total) by mouth Two (2) times a day. 60 tablet 11     No current facility-administered medications for this visit.       No Known Allergies    Patient Active Problem List   Diagnosis   ??? Hypothyroidism   ???  Macrocytosis   ??? Alcohol related seizure (CMS-HCC)   ??? Essential hypertension   ??? Alcohol withdrawal syndrome without complication (CMS-HCC)   ??? COPD (chronic obstructive pulmonary disease) (CMS-HCC)   ??? Tobacco use disorder   ??? GERD (gastroesophageal reflux disease)   ??? Anxiety and depression   ??? Nontoxic goiter   ??? Pancreatic insufficiency   ??? Thrombocytopenia concurrent with and due to alcoholism (CMS-HCC)   ??? Hypomagnesemia   ??? Overweight (BMI 25.0-29.9)   ??? Alcohol use disorder, severe, dependence (CMS-HCC)   ??? Cirrhosis of liver with ascites (CMS-HCC)   ??? Other ascites   ??? Portal hypertension (CMS-HCC)   ??? 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 Condyloma   ??? Back pain   ??? Recurrent major depressive disorder (CMS-HCC)   ??? Alcohol use disorder, severe, in sustained remission (CMS-HCC)       Reviewed and up to date in Epic.    Appropriateness of Therapy     Acute infections noted within Epic:  No active infections  Patient reported infection: {Blank single:19197::None,***- patient reported to provider,***- pharmacy reported to provider}    Is medication and dose appropriate based on diagnosis and infection status? Yes    Prescription has been clinically reviewed: Yes      Baseline Quality of Life Assessment      How many days over the past month did your hepatic encephalopathy  keep you from your normal activities? For example, brushing your teeth or getting up in the morning. {Blank:19197::0,***,Patient declined to answer}    Financial Information     Medication Assistance provided: Prior Authorization    Anticipated copay of $4.00 reviewed with patient. Verified delivery address.    Delivery Information     Scheduled delivery date: ***    Expected start date: ***    Medication will be delivered via UPS to the prescription address in Epic [place].  This shipment will not require a signature.      Explained the services we provide at [facility] and that each month we would call to set up refills.  Stressed importance of returning phone calls so that we could ensure they receive their medications in time each month.  Informed patient that we should be setting up refills 7-10 days prior to when they will run out of medication.  A pharmacist will reach out to perform a clinical assessment periodically.  Informed patient that a welcome packet, containing information about our pharmacy and other support services, a Notice of Privacy Practices, and a drug information handout will be sent.      The patient or caregiver noted above participated in the development of this care plan and knows that they can request review of or adjustments to the care plan at any time.      Patient or caregiver verbalized understanding of the above information as well as how to contact the pharmacy at [phone] option 4 with any questions/concerns.  The pharmacy is open Monday through Friday 8:30am-4:30pm.  A pharmacist is available 24/7 via pager to answer any clinical questions they may have.    Patient Specific Needs     - Does the patient have any physical, cognitive, or cultural barriers? {Blank single:19197::No,Yes - ***}    - Does the patient have adequate living arrangements? (i.e. the ability to store and take their medication appropriately) {Blank single:19197::Yes,No - ***}    - Did you identify any home environmental safety or security hazards? {  Blank single:19197::No,Yes - ***}    - Patient prefers to have medications discussed with  {Blank single:19197::Patient,Family Member,Caregiver,Other}     - Is the patient or caregiver able to read and understand education materials at a high school level or above? {Blank single:19197::No,Yes}    - Patient's primary language is  {Blank single:19197::English,Spanish,***}     - Is the patient high risk? {sschighriskpts:78327}        Roderic Palau  [facility] Specialty Pharmacist

## 2021-12-31 ENCOUNTER — Ambulatory Visit
Admission: RE | Admit: 2021-12-31 | Discharge: 2021-12-31 | Disposition: A | Discharge status: Home / Self Care | Payer: Disability Insurance | Source: Ambulatory Visit | Attending: Radiation Oncology | Admitting: Radiation Oncology

## 2021-12-31 DIAGNOSIS — Z8739 Personal history of other diseases of the musculoskeletal system and connective tissue: Secondary | ICD-10-CM

## 2021-12-31 DIAGNOSIS — M1990 Unspecified osteoarthritis, unspecified site: Secondary | ICD-10-CM | POA: Insufficient documentation

## 2022-01-01 NOTE — Unmapped (Signed)
Staff outreached to patient about today's scheduled behavioral health video visit.  Patient reports that she forgot about the visit today - has been feeling exhausted from multiple medical appointments over the past 3 days - requested to reschedule.  Patient rescheduled for 01/02/22.

## 2022-01-01 NOTE — Unmapped (Signed)
[facility]   Patient Onboarding/Medication Counseling    Ms.Erin Baxter is a 46 y.o. female with hepatic encephalopathy who I am counseling today on initiation of therapy.  I am speaking to the patient.    Was a Nurse, learning disability used for this call? No    Verified patient's date of birth / HIPAA.    Specialty medication(s) to be sent: Infectious Disease: Xifaxan      Non-specialty medications/supplies to be sent: n/a      Medications not needed at this time: n/a         Xifaxan (rifaximin) 550mg  tablets    Medication & Administration     Dosage: Hepatic Encephalopathy Prevention: Take one 550mg  tablet by mouth twice daily.    Administration: Take with or without food.    Adherence/Missed dose instructions:   ??? Take missed dose as soon as you remember. If it is close to the time of your next dose, skip the missed dose and resume with your next scheduled dose.  ??? Do not take extra doses or 2 doses at the same time.    Goals of Therapy     Hepatic Encephalopathy: The goal is to reduce risk of overt hepatic encephalopathy recurrence.    Side Effects & Monitoring Parameters     Common Side Effects:   ??? Peripheral edema  ??? Nausea  ??? Dizziness  ??? Fatigue  ??? Ascites    The following side effects should be reported to the provider:  ?? Signs of an allergic reaction, such as rash; hives; itching; red, swollen, blistered, or peeling skin with or without fever. If you have wheezing; tightness in the chest or throat; trouble breathing, swallowing, or talking; unusual hoarseness; or swelling of the mouth, face, lips, tongue, or throat, call 911 or go to the closest emergency department (ED).   ?? Swelling in the arms, legs or stomach.   ?? Feeling very tired or weak.  ?? Low mood (depression).   ?? Fever.   ?? Diarrhea is common with antibiotics. Rarely, a severe form called C diff-associated diarrhea (CDAD) may happen. Sometimes this has led to a deadly bowel problem (colitis). CDAD may happen during or a few months after taking antibiotics. Call your doctor right away if you have stomach pain, cramps, or very loose, watery, or bloody stools. Check with your doctor before treating diarrhea.    Monitoring Parameters:   ??? For the prevention of hepatic encephalopathy:  Patient should monitor for changes in mental status.       Contraindications, Warnings, & Precautions     ??? Superinfection: Prolonged use may result in fungal or bacterial superinfection, including Clostridioides (formerly Clostridium) difficile-associated diarrhea (CDAD) and pseudomembranous colitis; CDAD has been observed >2 months post-antibiotic treatment.  ??? Severe (Child Pugh Class C) Hepatic Impairment: increased systemic exposure with severe hepatic impairment.  ??? Concomitant use with P-glycoprotein (P-gp) inhibitors: P-gp inhibitors may increase systemic exposure of rifaximin.    Drug/Food Interactions     ??? Medication list reviewed in Epic. The patient was instructed to inform the care team before taking any new medications or supplements. No drug interactions identified.   ??? Warfarin: monitor INR and prothrombin time; Dose adjustment of warfarin may be needed to maintain target INR range.    Storage, Handling Precautions, & Disposal     ??? Store this medication at room temperature.  ??? Store in a dry place. Do not store in a bathroom.   ??? Keep all drugs  out of the reach of children and pets.  ??? Throw away unused or expired drugs. Do not flush down a toilet or pour down a drain unless you are told to do so. Check with your pharmacist if you have questions about the best way to throw out drugs. There may be drug take-back programs in your area.      Current Medications (including OTC/herbals), Comorbidities and Allergies     Current Outpatient Medications   Medication Sig Dispense Refill   ??? magnesium oxide (MAG-OX) 400 mg (241.3 mg elemental magnesium) tablet Take 1 tablet by mouth Two (2) times a day.     ??? amLODIPine (NORVASC) 10 MG tablet Take 1 tablet (10 mg total) by mouth daily. 90 tablet 1   ??? dandelion root (DANDELION ORAL) Take by mouth. tea     ??? ELDERBERRY FRUIT ORAL Take by mouth. Supplement  Also takes a [place] mushroom extract capsules that  could not find in the database to add to her med list     ??? FLUoxetine (PROZAC) 40 MG capsule Take 1 capsule (40 mg total) by mouth daily. 90 capsule 1   ??? gabapentin (NEURONTIN) 300 MG capsule Take 1 capsule (300 mg total) by mouth at bedtime as needed. 30 capsule 1   ??? hydrOXYzine (ATARAX) 25 MG tablet Take 1 tablet (25 mg total) by mouth every eight (8) hours as needed. 20 tablet 0   ??? imiquimod (ALDARA) 5 % cream Apply to affected area three times weekly 12 packet 1   ??? levothyroxine (SYNTHROID) 100 MCG tablet Take 1 tablet (100 mcg total) by mouth daily. 90 tablet 3   ??? multivitamins, therapeutic with minerals 9 mg iron-400 mcg tablet Take 1 tablet by mouth daily. 90 tablet 0   ??? nicotine (NICODERM CQ) 21 mg/24 hr patch Place 1 patch on the skin daily as needed. 28 patch 3   ??? pantoprazole (PROTONIX) 40 MG tablet TAKE 1 TABLET BY MOUTH EVERY DAY 90 tablet 1   ??? rifAXIMin (XIFAXAN) 550 mg Tab Take 1 tablet (550 mg total) by mouth Two (2) times a day. 60 tablet 11     No current facility-administered medications for this visit.       No Known Allergies    Patient Active Problem List   Diagnosis   ??? Hypothyroidism   ??? Macrocytosis   ??? Alcohol related seizure (CMS-HCC)   ??? Essential hypertension   ??? Alcohol withdrawal syndrome without complication (CMS-HCC)   ??? COPD (chronic obstructive pulmonary disease) (CMS-HCC)   ??? Tobacco use disorder   ??? GERD (gastroesophageal reflux disease)   ??? Anxiety and depression   ??? Nontoxic goiter   ??? Pancreatic insufficiency   ??? Thrombocytopenia concurrent with and due to alcoholism (CMS-HCC)   ??? Hypomagnesemia   ??? Overweight (BMI 25.0-29.9)   ??? Alcohol use disorder, severe, dependence (CMS-HCC)   ??? Cirrhosis of liver with ascites (CMS-HCC)   ??? Other ascites   ??? Portal hypertension (CMS-HCC)   ??? 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 Condyloma   ??? Back pain   ??? Recurrent major depressive disorder (CMS-HCC)   ??? Alcohol use disorder, severe, in sustained remission (CMS-HCC)       Reviewed and up to date in Epic.    Appropriateness of Therapy     Acute infections noted within Epic:  No active infections  Patient reported infection: None    Is medication and dose appropriate based on  diagnosis and infection status? Yes    Prescription has been clinically reviewed: Yes      Baseline Quality of Life Assessment      How many days over the past month did your hepatic encephalopathy  keep you from your normal activities? For example, brushing your teeth or getting up in the morning. 0    Financial Information     Medication Assistance provided: Prior Authorization    Anticipated copay of $4.00 reviewed with patient. Verified delivery address.    Delivery Information     Scheduled delivery date: 01/02/22    Expected start date: 01/02/22    Medication will be delivered via UPS to the prescription address in [facility].  This shipment will not require a signature.      Explained the services we provide at [facility] and that each month we would call to set up refills.  Stressed importance of returning phone calls so that we could ensure they receive their medications in time each month.  Informed patient that we should be setting up refills 7-10 days prior to when they will run out of medication.  A pharmacist will reach out to perform a clinical assessment periodically.  Informed patient that a welcome packet, containing information about our pharmacy and other support services, a Notice of Privacy Practices, and a drug information handout will be sent.      The patient or caregiver noted above participated in the development of this care plan and knows that they can request review of or adjustments to the care plan at any time.      Patient or caregiver verbalized understanding of the above information as well as how to contact the pharmacy at [phone] option 4 with any questions/concerns.  The pharmacy is open Monday through Friday 8:30am-4:30pm.  A pharmacist is available 24/7 via pager to answer any clinical questions they may have.    Patient Specific Needs     - Does the patient have any physical, cognitive, or cultural barriers? No    - Does the patient have adequate living arrangements? (i.e. the ability to store and take their medication appropriately) Yes    - Did you identify any home environmental safety or security hazards? No    - Patient prefers to have medications discussed with  Patient     - Is the patient or caregiver able to read and understand education materials at a high school level or above? Yes    - Patient's primary language is  English     - Is the patient high risk? No        Roderic Palau  [facility] Specialty Pharmacist

## 2022-01-02 NOTE — Unmapped (Signed)
Patient sent MyChart message requesting to rescheduled today's behavioral health video visit.  Patient reports feeling tired and overwhelmed with coordinating medical appointments and she accidentally double booked today's visit at the same time as another appointment.  Patient reports that son will be helping coordinate her calendar so she can feel less overwhelmed.  Staff responded to patient's message with note about rescheduling visit for 01/18/22.  Patient to reach out to staff if she needs a different day/time.

## 2022-01-08 DIAGNOSIS — N760 Acute vaginitis: Principal | ICD-10-CM

## 2022-01-08 MED ORDER — FLUCONAZOLE 150 MG TABLET
ORAL_TABLET | Freq: Once | ORAL | 0 refills | 1 days | Status: CP
Start: 2022-01-08 — End: 2022-01-08

## 2022-01-15 ENCOUNTER — Ambulatory Visit: Admit: 2022-01-15 | Payer: MEDICAID

## 2022-01-15 NOTE — Unmapped (Unsigned)
[facility]  [facility]  [address] [place]  [place], [place]  [address]  Ph: [phone]  Fax: [phone]     [facility] FOLLOW UP NOTE    PRIMARY CARE PROVIDER: Deneise Lever, FNP    DATE OF SERVICE: 01/15/2022    Assessment/Plan     Erin Baxter is a 46 y.o. female ***    without history of ascites, variceal bleed or hepatic encephalopathy.  - Discussed pathophysiology, diagnosis, and treatment centered around preventing decompensating effects and complications of cirrhosis.    Alcohol related Cirrhosis:  -MELD labs today, Peth test  -advised abstinence from alcohol  -last drink:***  -Avoid NSAIDS, acetaminophen <2 grams/day ok  -re-educated on high protein diet, frequent small meals and late night protein snack    HCC Screening: with imaging and AFP every 6 months  -Last done: 12/03/2021 Ultrasound:   -Due:           Ascites/Edema:   Lasix  Spiralactone  Educated re: low sodium diet    Esophageal varices:  -last EGD: 02/01/2021. No varices. Repeat in 01/2023    Hepatic Encephalopathy:   -lactulose, titrate to 3-4 stools per day    Tobacco abuse:      Preventative Health:   - Colonoscopy: Until age 73, we recommend routine surveillance. ***     Vaccinations: We recommend that patients have vaccinations to prevent various infections that can occur, especially in the setting of having underlying liver disease. The following vaccinations should be given:  -Hepatitis A/B: Twinrix 06/2021, 08/2021, third dose given today***  -Influenza (yearly):  -Pneumococcal: PPSV23 and PCV13  -Zoster: Shingrix (age > 62)  -SARS-CoV-2: {TFHSARSVac:90034}  ***, recommend bivalent COVID vaccine, obtain locally    I spent a majority of my time today with the patient reviewing historical data, old records (including faxed data and data in Care Everywhere), performing a physical examination, and formulating an assessment and plan together with the patient.       No follow-ups on file.     Priscille Heidelberg, NP  [facility]  Division of Gastroenterology &Hepatology      Subjective     HPI:     Erin Baxter is a 46 y.o. female ***      Interval Hx:     Patient was last seen on 11/01/2021 by Edrick Kins, PA,  in the interim    she has not experienced jaundice, ascites, lower extremity edema, gastrointestinal bleeding, puritus, overt confusion or liver related hospitalizations.         MEDICATIONS:   Current Outpatient Medications   Medication Instructions   ??? amLODIPine (NORVASC) 10 mg, Oral, Daily (standard)   ??? dandelion root (DANDELION ORAL) Oral, tea   ??? ELDERBERRY FRUIT ORAL Oral, Supplement<BR>Also takes a [place] mushroom extract capsules that  could not find in the database to add to her med list   ??? FLUoxetine (PROZAC) 40 mg, Oral, Daily (standard)   ??? gabapentin (NEURONTIN) 300 mg, Oral, At bedtime as needed   ??? hydrOXYzine (ATARAX) 25 mg, Oral, Every 8 hours PRN   ??? imiquimod (ALDARA) 5 % cream Apply to affected area three times weekly   ??? levothyroxine (SYNTHROID) 100 mcg, Oral, Daily (standard)   ??? magnesium oxide (MAG-OX) 400 mg (241.3 mg elemental magnesium) tablet 1 tablet, Oral, 2 times a day (standard)   ??? multivitamins, therapeutic with minerals 9 mg iron-400 mcg tablet 1 tablet, Oral, Daily (standard)   ???  nicotine (NICODERM CQ) 21 mg/24 hr patch 1 patch, Transdermal, Daily PRN   ??? pantoprazole (PROTONIX) 40 MG tablet TAKE 1 TABLET BY MOUTH EVERY DAY   ??? XIFAXAN 550 mg, Oral, 2 times a day (standard)       Objective     Objective:   Vital signs: There were no vitals taken for this visit.   Wt Readings from Last 3 Encounters:   12/06/21 73.5 kg (162 lb)   11/01/21 69.7 kg (153 lb 9.6 oz)   09/05/21 70.1 kg (154 lb 9.6 oz)     General: NAD   Eyes: Anicteric sclerae   Cardiovascular: No peripheral edema   Abdomen: Soft, NT, ND without hepatosplenomegaly, hernias or masses   Heme/lymph: No cervical or supraclavicular lymphadenopathy.   Musculoskeletal:  No clubbing or cyanosis of hands. Normal range of motion in upper and lower extremities.   Integumentary: No rashes or open wounds. No spider angiomata, palmar erythema, rashes.   Neurologic: No gross abnormalities noted, no asterixis   Psychiatric: Alert and oriented to person, place and time. Normal affect.       Diagnostic studies:     Lab Results   Component Value Date    WBC 11.8 (H) 11/01/2021    HGB 12.4 11/01/2021    MCV 90.1 11/01/2021    PLT 243 11/01/2021    Lab Results   Component Value Date    NA 140 11/01/2021    K 4.4 11/01/2021    CL 106 11/01/2021    CREATININE 0.63 11/01/2021    GLU 100 (H) 11/01/2021    CALCIUM 9.5 11/01/2021      Lab Results   Component Value Date    ALBUMIN 4.0 11/01/2021    AST 22 11/01/2021    ALT 15 11/01/2021    ALKPHOS 130 (H) 11/01/2021    BILITOT 0.3 11/01/2021    Lab Results   Component Value Date    INR 1.09 11/01/2021        Lab Results   Component Value Date    AFPTM 6 11/01/2021    AFPTM 8 04/23/2021    AFPTM 14 (H) 12/25/2020       MELD-Na score: 7 at 11/01/2021  4:33 PM  Calculated from:  Serum Creatinine: 0.63 mg/dL (Using min of 1 mg/dL) at 16/08/9603  5:40 PM  Serum Sodium: 140 mmol/L (Using max of 137 mmol/L) at 11/01/2021  4:33 PM  Total Bilirubin: 0.3 mg/dL (Using min of 1 mg/dL) at 98/09/9146  8:29 PM  INR(ratio): 1.09 at 11/01/2021  4:33 PM  Age: 25 years    Imaging:  12/03/2021 abdominal ultrasound  Heterogenous liver with undulating borders, likely representing chronic liver disease.  No focal liver lesion identified.   Endoscopy:  02/01/2021 EGD:   Findings:  -The examined esophagus was normal.  -There is no endoscopic evidence of varices in the lower third of the  esophagus.  -The entire examined stomach was normal.  -The examined duodenum was normal.                                                                                   Impression:   - Normal  esophagus.                         - Normal stomach.                         - Normal examined duodenum. - No specimens collected.

## 2022-01-18 NOTE — Unmapped (Signed)
Staff outreached to patient about today's scheduled behavioral health video visit.  Patient reports that her brother died last night - just found out and children are coming over to help.  Patient will message or call later to reschedule.

## 2022-01-20 DIAGNOSIS — F411 Generalized anxiety disorder: Principal | ICD-10-CM

## 2022-01-20 MED ORDER — HYDROXYZINE HCL 25 MG TABLET
ORAL_TABLET | Freq: Three times a day (TID) | ORAL | 0 refills | 7 days | Status: CP | PRN
Start: 2022-01-20 — End: ?

## 2022-02-04 NOTE — Unmapped (Signed)
[facility] Clinical Intervention    Type of intervention: Patient education    Medication involved: Xifaxan    Problem identified: I called Ms. Caudell today to perform a clinical assessment and refill coordination based on her anticipated start date of 01/02/22.  She said she has not started the medication yet because she read the monograph that came with the medication and was concerned about the statement regarding vaccines.    Intervention performed: I told her that Xifaxan may cause live vaccines (such as the typhoid vaccine) to not work well and that she is not receiving and there is no plan for her to receive any live vaccines.  I reminded her that the goal of Xifaxan treatment is to reduce risk of overt hepatic encephalopathy recurrence that can cause to have to go to the hospital and that the Xifaxan can help her think more clearly.    Follow-up needed: Yes, I am rescheduling the clinical assessment and refill coordination call to approximately 3 weeks from today.    Approximate time spent: 5-10 minutes    Clinical evidence used to support intervention: Drug information resource     Roderic Palau   [facility] Specialty Pharmacist

## 2022-02-06 ENCOUNTER — Institutional Professional Consult (permissible substitution): Admit: 2022-02-06 | Discharge: 2022-02-07 | Payer: MEDICAID

## 2022-02-13 ENCOUNTER — Ambulatory Visit: Admit: 2022-02-13 | Payer: MEDICAID | Attending: Mental Health | Primary: Mental Health

## 2022-02-13 NOTE — Unmapped (Signed)
Patient did not show for today's behavioral health appointment. LCSW called patient in hopes of discussing this missed appointment and to reschedule, and patient did not answer.  Treatment is now discontinued until patient returns call.

## 2022-02-14 DIAGNOSIS — M549 Dorsalgia, unspecified: Principal | ICD-10-CM

## 2022-02-14 DIAGNOSIS — F419 Anxiety disorder, unspecified: Principal | ICD-10-CM

## 2022-02-14 DIAGNOSIS — F32A Anxiety and depression: Principal | ICD-10-CM

## 2022-02-14 DIAGNOSIS — M542 Cervicalgia: Principal | ICD-10-CM

## 2022-02-14 MED ORDER — GABAPENTIN 300 MG CAPSULE
ORAL_CAPSULE | Freq: Every evening | ORAL | 1 refills | 30 days | Status: CP | PRN
Start: 2022-02-14 — End: 2023-02-14

## 2022-02-18 NOTE — Unmapped (Signed)
Outreached to patient to schedule Behavioral Health Visit. Patient stated she will call back to reschedule the visit with [facility]. Stated its been a rough few weeks and she needs some time.    Abstraction Result Flowsheet Data    This patient's last AWV date: [facility] Last Medicare Wellness Visit Date: Not Found  This patients last WCC/CPE date: : Not Found      Reason for Encounter  Reason for Encounter: Outreach  Primary Reason for Outreach: [employer] Follow Up  Text Message: No  MyChart Message: No  Outreach Call Outcome: Patient will call back

## 2022-02-20 ENCOUNTER — Ambulatory Visit: Admit: 2022-02-20 | Discharge: 2022-02-21 | Payer: MEDICAID

## 2022-02-20 DIAGNOSIS — Z7185 Vaccine counseling: Principal | ICD-10-CM

## 2022-02-20 DIAGNOSIS — M5441 Lumbago with sciatica, right side: Principal | ICD-10-CM

## 2022-02-20 DIAGNOSIS — E039 Hypothyroidism, unspecified: Principal | ICD-10-CM

## 2022-02-20 DIAGNOSIS — M549 Dorsalgia, unspecified: Principal | ICD-10-CM

## 2022-02-20 DIAGNOSIS — G8929 Other chronic pain: Principal | ICD-10-CM

## 2022-02-20 DIAGNOSIS — M542 Cervicalgia: Principal | ICD-10-CM

## 2022-02-20 DIAGNOSIS — F32A Anxiety and depression: Principal | ICD-10-CM

## 2022-02-20 DIAGNOSIS — I10 Essential (primary) hypertension: Principal | ICD-10-CM

## 2022-02-20 DIAGNOSIS — F419 Anxiety disorder, unspecified: Principal | ICD-10-CM

## 2022-02-20 DIAGNOSIS — K219 Gastro-esophageal reflux disease without esophagitis: Principal | ICD-10-CM

## 2022-02-20 DIAGNOSIS — F332 Major depressive disorder, recurrent severe without psychotic features: Principal | ICD-10-CM

## 2022-02-20 MED ORDER — AMLODIPINE 10 MG TABLET
ORAL_TABLET | Freq: Every day | ORAL | 3 refills | 90 days | Status: CP
Start: 2022-02-20 — End: 2023-02-20

## 2022-02-20 MED ORDER — PANTOPRAZOLE 40 MG TABLET,DELAYED RELEASE
ORAL_TABLET | Freq: Every day | ORAL | 1 refills | 90.00000 days | Status: CP
Start: 2022-02-20 — End: ?

## 2022-02-20 MED ORDER — METHYLPREDNISOLONE 4 MG TABLETS IN A DOSE PACK
Freq: Every day | ORAL | 0 refills | 1 days | Status: CP
Start: 2022-02-20 — End: 2023-02-20

## 2022-02-20 MED ORDER — GABAPENTIN 300 MG CAPSULE
ORAL_CAPSULE | Freq: Two times a day (BID) | ORAL | 3 refills | 30 days | Status: CP
Start: 2022-02-20 — End: 2023-02-20

## 2022-02-20 NOTE — Unmapped (Signed)
Will look into increase in gabapentin, Steroid taper

## 2022-02-20 NOTE — Unmapped (Addendum)
Per provider with liver team: increase of gabapentin from 300 mg every day to 300 mg BID, steroid taper-safe for patient, given liver function.  Will send rx and message patient.  Assessment and Plan:     Erin Baxter was seen today for follow-up and leg pain.    Diagnoses and all orders for this visit:    Chronic right-sided low back pain with right-sided sciatica          -     Gentle activity, stretches/yoga as tolerated        -    Patient stopped BC powders for pain, using nonpharmacological therapies like heat/cold, massage        -    Pt open to referral to spine center        -    Consider steroid taper, increase in gabapentin-will review with liver provider prior to prescription  -     Ambulatory referral to Spine Center; Future    Cirrhosis of liver          -   Followed by [facility] liver team, completed rifaximin        -   AST 22, ALT 15 (12/22), aware of follow-up on 04/30/22    Severe episode of recurrent major depressive disorder, without psychotic features (CMS-HCC)  Hx ETOH           -   Noted PHQ, GAD scores         -   Despite death of brother and death of close family friend, mood stable.         -   Patient adherent with 40 mg fluoxetine, 300 mg gabapentin         -   Has not attended meetings with virtual LCSW for therapy         -   Relocated back to [place] from [place], no cravings or temptations to resume alcohol consumption         -   Patient sober for 1 year and 1 month    Hypothyroidism, unspecified type          -  Adherent with 100 mcg levothyroxine        -  TSH: TSH: 0.508 (10/22)    Essential hypertension          -     DASH diet, activity as tolerated/able        -     Patient checks blood pressures at home, similar levels to those obtained in clinic        -    Cr:0.63, K 4.4 (12/22)  -     amLODIPine (NORVASC) 10 MG tablet; Take 1 tablet (10 mg total) by mouth daily.    Gastroesophageal reflux disease, unspecified whether esophagitis present          -     Patient eating small portions, consuming mostly fruits, vegetables lean proteins.        -     EGD done 3/22: negative  -     pantoprazole (PROTONIX) 40 MG tablet; Take 1 tablet (40 mg total) by mouth daily.    Vaccine counseling    -Up-to-date with [facility], patient declined Pneumovax.    FH of colon polyps:         - Pt states mom with polyps--open to getting colonoscopy, deferred referral for today  -Aware can message provider when ready to proceed with colonoscopy.  Barriers to recommended  plan: None identified    Return if symptoms worsen or fail to improve, for 3-4 Months.      Subjective:     HPI: Erin Baxter is a 46 y.o. female here for Follow-up and Leg Pain (Right hip feels numb going down to her leg mid calf. Feels that it is hot at times.  ).    Leg pain:  States can only stand for 10-15 minutes, numbness from R hip to toes  Feels like 'it may go out' from under me, concerned may need to use cane to walk, go to stores, etc.  Occurring for past 2 months, getting worse every day  R leg alternates from hot to cold, cannot lay on R hip--'feels like I need to have something done',   Lower back pain, left sided neck pain--ongoing  States bending, twisting making pain worse. Did take [facility] powders--aware needs to stop given liver health  Mild swelling in R ankle, mild swelling in R knee  Went to chiropracter x 5--stopped due to insurance problems  X-rays (2/23):  FINDINGS:   No acute fracture or subluxation. The vertebral body heights are   maintained. Grade 1 L5-S1 retrolisthesis. The visualized posterior   elements are intact. The soft tissues are unremarkable.   B knee x-rays: no acute findings    Liver failure:  Took rifaxmin--uncertain on efficacy-bowel movements once per day, urinating without difficulty--sometimes has control problem  Eating well-fruits, vegetables, salmon, chicken, taking PPI  Some brain fog--feels fog getting better, but feeling tired  Has upcoming appointment with liver team 04/30/22  Liver function tests trending down-AST 22, ALT 15 2023/11/08)    Mood:  Despite death of brother, death of close family friend--mood stable  Living in [place] now, close to family  'Committed to sobriety' --no temptation to drink, sober 1 year, 1 month  Proud of progress  Adherent with 40 mg fluoxetine, 300 mg gabapentin    HTN: Adherent with 10 mg amlodipine, states home Bps similar to levels in clinic.Cr:0.63, K 4.4 Nov 08, 2023)  Hypothyroid: 100 mcg levothyroxine, last TSH: 0.508 (10/22)  Vaccine counseling-UTD with [facility], declined pneumovax  Colonoscopy: mom with polyps, pt aware she needs colonoscopy-will let provider know when ready for referral    I have reviewed past medical, surgical, medications, allergies, social and family histories today and updated them in Epic where appropriate.    ROS:   PHQ-9 PHQ-9 TOTAL SCORE   02/20/2022   1:00 PM 20   12/18/2021   8:00 AM 23   12/06/2021   1:00 PM 22   09/05/2021   3:00 PM 19   07/06/2021   1:00 PM 23     GAD7 Total Score GAD-7 Total Score   02/20/2022   1:00 PM 15   12/18/2021   8:00 AM 21   12/06/2021   1:00 PM 9   09/05/2021   3:00 PM 19   07/06/2021   1:00 PM 11     Review of systems negative unless otherwise noted as per HPI.      Objective:     Vitals:    02/20/22 1309   BP: 120/70   Pulse: 82   Temp: 36.8 ??C (98.3 ??F)   SpO2: 96%     Body mass index is 26.6 kg/m??.    Physical Exam  Vitals and nursing note reviewed.   Constitutional:       Appearance: Normal appearance.   HENT:      Head: Normocephalic and atraumatic.  Cardiovascular:      Rate and Rhythm: Normal rate and regular rhythm.      Pulses: Normal pulses.      Heart sounds: Normal heart sounds.   Pulmonary:      Effort: Pulmonary effort is normal.      Breath sounds: Normal breath sounds.   Abdominal:      General: Abdomen is flat. Bowel sounds are normal. There is no distension.      Palpations: Abdomen is soft. There is no mass.      Tenderness: There is no abdominal tenderness.      Hernia: No hernia is present. Musculoskeletal:      Cervical back: Normal range of motion and neck supple.      Comments: Soft tissue tenderness noted in lower lumbar-sacral area of back, R sided  FROM in R hip, no bony tenderness  No bony tenderness in R knee, R ankle  Pulsation, sensation intact  +SLR in RLE.   Skin:     General: Skin is warm and dry.      Capillary Refill: Capillary refill takes less than 2 seconds.   Neurological:      General: No focal deficit present.      Mental Status: She is alert and oriented to person, place, and time. Mental status is at baseline.   Psychiatric:         Mood and Affect: Mood normal.         Behavior: Behavior normal.         Thought Content: Thought content normal.         Judgment: Judgment normal.          Medication adherence and barriers to the treatment plan have been addressed. Opportunities to optimize healthy behaviors have been discussed. Patient / caregiver voiced understanding.   I personally spent 30 minutes face-to-face and non-face-to-face in the care of this patient, which includes all pre, intra, and post visit time on the date of service.  Cleon Dew, DNP, FNP-C  [facility] at [facility]  [phone] (F)    Note - This record has been created using [employer]. Chart creation errors have been sought, but may not always have been located. Such creation errors do not reflect on the standard of medical care.

## 2022-02-20 NOTE — Unmapped (Signed)
Addended by: Cleon Dew on: 02/20/2022 04:45 PM     Modules accepted: Orders

## 2022-03-11 NOTE — Unmapped (Signed)
The [facility] has made a third and final attempt to reach this patient to refill the following medication: Xifaxan.      We have left voicemails on the following phone numbers: [phone], have been unable to leave messages on the following phone numbers: [phone] (vm now full), have sent a MyChart message, and have sent a text message to the following phone numbers: [phone] .    Dates contacted: 04/20, 04/26 and 05/01  Last scheduled delivery: 01/03/22.  However, on 02/04/22 she said she had not started the Xifaxan yet/    The patient may be at risk of non-compliance with this medication. The patient should call the [facility] at [phone]  Option 4, then Option 2 (all other specialty patients) to refill medication.    Roderic Palau   [facility] Specialty Pharmacist

## 2022-03-12 NOTE — Unmapped (Signed)
Reason for call: Assist with refill coordination of Xifaxan 550 mg    [facility] has made several attempts to reach pt to refill pt's Xifaxan 550 mg to prevent any gaps in treatment. I called and spoke with pt at phone # [phone]. Pt stated to honest with you I haven't started taking it yet. Pt reports being fearful to take it because she was unsure if it would cause harm to her liver. I let pt know this is a FDA approved medication that will help reduce the risk of HE. I educated pt that Burman Blacksmith is an antibiotic that slows the growth of bacteria in the gut that is believed to be linked to symptoms of HE. Pt reports that she was unable to take lactulose. I encouraged pt to try Xifaxan 550 mg BID and we will check back in a couple weeks to see how she is doing on the medication. Pt was agreeable to starting Xifaxan tomorrow morning. I will let Encarnacion Slates D know I have spoke with pt and to set up another refill again in a couple weeks.     Vertell Limber RN, [facility]   Pharmacy Department  [facility]  [address]   [place], [place] [address]  [phone]

## 2022-03-28 MED FILL — XIFAXAN 550 MG TABLET: ORAL | 30 days supply | Qty: 60 | Fill #1

## 2022-03-28 NOTE — Unmapped (Signed)
[facility] Clinical Assessment & Refill Coordination Note    Erin Baxter, DOB: [birth year]  Phone: [phone] (home)     All above HIPAA information was verified with patient.     Was a Nurse, learning disability used for this call? No    Specialty Medication(s):   Infectious Disease: Xifaxan     Current Outpatient Medications   Medication Sig Dispense Refill    amLODIPine (NORVASC) 10 MG tablet Take 1 tablet (10 mg total) by mouth daily. 90 tablet 3    dandelion root (DANDELION ORAL) Take by mouth. tea      ELDERBERRY FRUIT ORAL Take by mouth. Supplement  Also takes a [place] mushroom extract capsules that  could not find in the database to add to her med list      FLUoxetine (PROZAC) 40 MG capsule Take 1 capsule (40 mg total) by mouth daily. 90 capsule 1    gabapentin (NEURONTIN) 300 MG capsule Take 1 capsule (300 mg total) by mouth two (2) times a day. 60 capsule 3    hydrOXYzine (ATARAX) 25 MG tablet Take 1 tablet (25 mg total) by mouth every eight (8) hours as needed. 20 tablet 0    imiquimod (ALDARA) 5 % cream Apply to affected area three times weekly 12 packet 1    levothyroxine (SYNTHROID) 100 MCG tablet Take 1 tablet (100 mcg total) by mouth daily. 90 tablet 3    magnesium oxide (MAG-OX) 400 mg (241.3 mg elemental magnesium) tablet Take 1 tablet by mouth Two (2) times a day.      methylPREDNISolone (MEDROL DOSEPACK) 4 mg tablet Take 1 tablet (4 mg total) by mouth daily. follow package directions 1 each 0    multivitamins, therapeutic with minerals 9 mg iron-400 mcg tablet Take 1 tablet by mouth daily. 90 tablet 0    nicotine (NICODERM CQ) 21 mg/24 hr patch Place 1 patch on the skin daily as needed. 28 patch 3    pantoprazole (PROTONIX) 40 MG tablet Take 1 tablet (40 mg total) by mouth daily. 90 tablet 1    rifAXIMin (XIFAXAN) 550 mg Tab Take 1 tablet (550 mg total) by mouth Two (2) times a day. (Patient not taking: Reported on 02/20/2022) 60 tablet 11     No current facility-administered medications for this visit.        Changes to medications: [place] reports no changes at this time.    No Known Allergies    Changes to allergies: No    SPECIALTY MEDICATION ADHERENCE     Xifaxan 550 mg: 5 days of medicine on hand       Medication Adherence    Patient reported X missed doses in the last month: 0  Specialty Medication: Xifaxan 550mg   Patient is on additional specialty medications: No  Demonstrates understanding of importance of adherence: yes  Informant: patient  Provider-estimated medication adherence level: good  Patient is at risk for Non-Adherence: No          Specialty medication(s) dose(s) confirmed: Regimen is correct and unchanged.     Are there any concerns with adherence? No    Adherence counseling provided? Not needed    CLINICAL MANAGEMENT AND INTERVENTION      Clinical Benefit Assessment:    Do you feel the medicine is effective or helping your condition? Yes    Clinical Benefit counseling provided? Not needed    Adverse Effects Assessment:    Are you experiencing any side effects? No  Are you experiencing difficulty administering your medicine? No    Quality of Life Assessment:      How many days over the past month did your Hepatic Encephalopathy  keep you from your normal activities? For example, brushing your teeth or getting up in the morning. 0    Have you discussed this with your provider? Not needed    Acute Infection Status:    Acute infections noted within Epic:  No active infections  Patient reported infection: None    Therapy Appropriateness:    Is therapy appropriate and patient progressing towards therapeutic goals? Yes, therapy is appropriate and should be continued    DISEASE/MEDICATION-SPECIFIC INFORMATION      N/A    PATIENT SPECIFIC NEEDS     Does the patient have any physical, cognitive, or cultural barriers? No    Is the patient high risk? No    Does the patient require a Care Management Plan? No       SHIPPING     Specialty Medication(s) to be Shipped:   Infectious Disease: Xifaxan    Other medication(s) to be shipped: No additional medications requested for fill at this time     Changes to insurance: No    Delivery Scheduled: Yes, Expected medication delivery date: 03/29/22.     Medication will be delivered via UPS to the confirmed prescription address in [facility].    The patient will receive a drug information handout for each medication shipped and additional FDA Medication Guides as required.  Verified that patient has previously received a Conservation officer, historic buildings and a Surveyor, mining.    The patient or caregiver noted above participated in the development of this care plan and knows that they can request review of or adjustments to the care plan at any time.      All of the patient's questions and concerns have been addressed.    Roderic Palau   [facility] Shared [facility] Specialty Pharmacist

## 2022-04-03 ENCOUNTER — Ambulatory Visit: Admit: 2022-04-03 | Discharge: 2022-04-04 | Payer: MEDICAID | Attending: Family | Primary: Family

## 2022-04-03 DIAGNOSIS — F419 Anxiety disorder, unspecified: Principal | ICD-10-CM

## 2022-04-03 DIAGNOSIS — R202 Paresthesia of skin: Principal | ICD-10-CM

## 2022-04-03 DIAGNOSIS — R0789 Other chest pain: Principal | ICD-10-CM

## 2022-04-03 DIAGNOSIS — R200 Anesthesia of skin: Principal | ICD-10-CM

## 2022-04-03 NOTE — Unmapped (Signed)
[facility] Primary Care at [facility] Note:  [place], [place] [address]. Phone [phone]    04/03/2022    Patient Name:   Erin Baxter    MRN: [id number]    Demographics:    Age-  46 y.o.     Date of Birth-  1976/01/07    Chief complaint (CC):    Chief Complaint   Patient presents with   ??? Panic Attack     Yesterday afternoon,    ??? Numbness     Left hand numbness since the panic attack.        Assessment/Plan:    Erin Baxter is a challenging 46 y.o. female with multiple medical problems who appears to be intoxicated or in crisis. Listed concerns include liver failure, chest pain (left-sided 6-7/10 chest pain), possible stroke, bilateral upper extremity weakness and paresthesia can't even hold my cigarettes, shortness of breath at rest which is worse with exertion, dizziness/foggy feeling, increased anxiety. Admits to not taking rifaximin or lactulose. Advised patient she is beyond primary care or urgent evaluation evaluation and she requires ER consult. Patient discharged into fiance's care who intends to drive her to [facility] ED, address provided to patient today. Will call ahead to notify ER triage team.      Diagnosis ICD-10-CM Associated Orders   1. Other chest pain  R07.89 Advised ER consult      2. Numbness and tingling in left arm  R20.0 Advised ER consult    R20.2       3. Acute anxiety  F41.9 Advised ER consult        4.  Health Maintenance Review:   Health Maintenance Due   Topic Date Due   ??? COPD Spirometry  Never done   ??? COVID-19 Vaccine (1) Never done   ??? Pneumococcal Vaccine 0-64 (1 - PCV) Never done   ??? Colon Cancer Screening  Never done     40 minutes of clinical time > 1/2 the office visit was face to face time. We discussed medical, dietary, lifestyle, and health maintenance modifications to optimize health. Standard precautions followed during visit. Medication adherence and barriers to the treatment plan have been addressed.  Patient voiced understanding, needs F/U visit as scheduled.    Subjective:    History of present illness (HPI):       Erin Baxter is a 46 y.o. female who presented to [facility] for evaluation of chest pain. Patient reports episode of tunnel vision, weakness, and near syncope yesterday at 3 pm. She subsequently developed symptoms of left-sided chest and rib cage pain, numbness and weakness affecting her bilateral upper extremities. She went to bed around 3pm, and woke up this morning with ongoing symptoms of bilateral upper extremity weakness and paresthesia can't even hold my cigarettes, left-sided 6-7/10 chest pain, shortness of breath at rest which is worse with exertion, dizziness and foggy feeling, increased anxiety. She expresses that her husband mentioned I kept waking up and saying my chest hurt and I was dying. She felt frantic this morning and had to call social services cause I yelled at my best friend. She admits that she has not been taking her rifaximin and believes her ammonia level is high and she is losing my mind. She notes that she believes she had a stroke. She reports she is not urinating and believes her abdomen needs to be drained. She admits she has lactulose at home which she could not take as this caused her to experience  significant abdominal swelling. Patient admits to having issues with a friend yesterday which are contributing to worse mood and says she is losing my mind. She denies any previous episodes of symptoms, did have a cold 1 week ago and has some residual cough she attributes to this. Patient endorses being 15 months sober from all alcohol use. She notes she is waiting on a liver transplant. PMHx of alcoholic cirrhosis of liver with ascities, GERD, pancreatic insufficiency, hypothyroidism, nontoxic goiter, COPD, HTN, alcohol use disorder (severe), alcohol withdrawal seizures, anxiety and depression, tobacco use disorder. Medications used in the past were reviewed, see medications. Patient reports eating and eliminating well. Pt is sleeping well. Patient has not required emergency room treatment for these symptoms, and has not required hospitalization.     Denies HA, fever, chest pain, shortness of breath, N/V/D, bowel or bladder issues, vision or hearing changes, or swelling.     Relevant ROS: Reviewed 12 systems, positive findings listed, all others negative.    Pertinent Past Med Hx:    Past Medical History:   Diagnosis Date   ??? Disease of thyroid gland    ??? ETOH abuse    ??? Pancreatitis        Medications:       Current Outpatient Medications:   ???  amLODIPine (NORVASC) 10 MG tablet, Take 1 tablet (10 mg total) by mouth daily., Disp: 90 tablet, Rfl: 3  ???  gabapentin (NEURONTIN) 300 MG capsule, Take 1 capsule (300 mg total) by mouth two (2) times a day., Disp: 60 capsule, Rfl: 3  ???  hydrOXYzine (ATARAX) 25 MG tablet, Take 1 tablet (25 mg total) by mouth every eight (8) hours as needed., Disp: 20 tablet, Rfl: 0  ???  levothyroxine (SYNTHROID) 100 MCG tablet, Take 1 tablet (100 mcg total) by mouth daily., Disp: 90 tablet, Rfl: 3  ???  magnesium oxide (MAG-OX) 400 mg (241.3 mg elemental magnesium) tablet, Take 1 tablet by mouth Two (2) times a day., Disp: , Rfl:   ???  multivitamins, therapeutic with minerals 9 mg iron-400 mcg tablet, Take 1 tablet by mouth daily., Disp: 90 tablet, Rfl: 0  ???  pantoprazole (PROTONIX) 40 MG tablet, Take 1 tablet (40 mg total) by mouth daily., Disp: 90 tablet, Rfl: 1  ???  dandelion root (DANDELION ORAL), Take by mouth. tea, Disp: , Rfl:   ???  ELDERBERRY FRUIT ORAL, Take by mouth. Supplement Also takes a [place] mushroom extract capsules that  could not find in the database to add to her med list, Disp: , Rfl:   ???  FLUoxetine (PROZAC) 40 MG capsule, Take 1 capsule (40 mg total) by mouth daily. (Patient not taking: Reported on 04/03/2022), Disp: 90 capsule, Rfl: 1  ???  imiquimod (ALDARA) 5 % cream, Apply to affected area three times weekly, Disp: 12 packet, Rfl: 1  ???  methylPREDNISolone (MEDROL DOSEPACK) 4 mg tablet, Take 1 tablet (4 mg total) by mouth daily. follow package directions (Patient not taking: Reported on 04/03/2022), Disp: 1 each, Rfl: 0  ???  nicotine (NICODERM CQ) 21 mg/24 hr patch, Place 1 patch on the skin daily as needed. (Patient not taking: Reported on 04/03/2022), Disp: 28 patch, Rfl: 3  ???  rifAXIMin (XIFAXAN) 550 mg Tab, Take 1 tablet (550 mg total) by mouth Two (2) times a day. (Patient not taking: Reported on 02/20/2022), Disp: 60 tablet, Rfl: 11     Allergies:   No Known Allergies    Pertinent Social Hx and  Habits: EMR reviewed    Pertinent Family Hx: EMR reviewed    Objective:      BP Readings from Last 3 Encounters:   04/03/22 118/84   02/20/22 120/70   12/06/21 115/68        Vitals:    04/03/22 1523   BP: 118/84   BP Site: R Arm   BP Position: Sitting   Pulse: 90   SpO2: 97%   Weight: 76.2 kg (168 lb)   Height: 170.2 cm (5' 7)        Physical Exam:    General Appearance: WDWN in NAD      Skin: W, D, I  HEENT: PERRLA, EOMI, TM's clear  Respiratory: Respiratory rate rapid, pressured speech. Slightly course lung sounds bilaterally with wheeze, clears with cough.  Cardio: RRR  Abdomen: Soft and non-tnder.  Neurologic: A & O X 4, Grossly intact, stable gait  PSYCH: Behavior erratic, pressured speech, has difficult time sitting still. Flight of ideas, jumps from topic to topic. Patient verbalizes she feels she is in crisis.     Diagnostics:     Lab Results   Component Value Date    WBC 11.8 (H) 11/01/2021    HGB 12.4 11/01/2021    HCT 37.3 11/01/2021    PLT 243 11/01/2021       Lab Results   Component Value Date    NA 140 11/01/2021    K 4.4 11/01/2021    CL 106 11/01/2021    CO2 26.0 11/01/2021    BUN 12 11/01/2021    CREATININE 0.63 11/01/2021    GLU 100 (H) 11/01/2021    CALCIUM 9.5 11/01/2021    MG 1.4 (L) 01/16/2021    PHOS 2.6 10/29/2020       Lab Results   Component Value Date    BILITOT 0.3 11/01/2021    BILIDIR 1.00 (H) 07/13/2020    PROT 7.5 11/01/2021    ALBUMIN 4.0 11/01/2021 ALT 15 11/01/2021    AST 22 11/01/2021    ALKPHOS 130 (H) 11/01/2021       Lab Results   Component Value Date    PT 12.4 11/01/2021    INR 1.09 11/01/2021        TSH   Date Value Ref Range Status   09/05/2021 0.508 (L) 0.550 - 4.780 uIU/mL Final   07/06/2021 47.003 (H) 0.550 - 4.780 uIU/mL Final        I attest that I, Benita Stabile, personally documented this note while acting as scribe for Olena Leatherwood, NP.      Benita Stabile, Scribe.  04/03/2022     The documentation recorded by the scribe accurately reflects the service I personally performed and the decisions made by me.     Olena Leatherwood, NP    Dr. Betha Loa, DNP, FNP-BC  [facility] Primary Care at [facility] Certified Doctor of Nursing Practice   [phone]

## 2022-04-03 NOTE — Unmapped (Signed)
Erin Baxter,    Please seek urgent evaluation at [facility] ED as I believe you are in crisis. I have provided the address below.       [facility]  [phone]  [address]  [place], [place] [address]     Take Care,   Dr. Nira Retort

## 2022-04-03 NOTE — Unmapped (Signed)
Recent:   What is the date of your last related visit?  N/a  Related acute medications Rx'd:  n/a  Home treatment tried:  n/a      Relevant:   Allergies: Patient has no known allergies.  Medications: n/a   Health History: On liver transplant list  Weight: n/a      Reason for Disposition   Patient sounds very upset or troubled to the triager   Neurologic deficit that was brief (now gone), ANY of the following: * Weakness of the face, arm, or leg on one side of the body* Numbness of the face, arm, or leg on one side of the body* Loss of speech or garbled speech    Answer Assessment - Initial Assessment Questions  Called patient and L/M with our follow-up #. Patient called back.    Answer Assessment - Initial Assessment Questions  1. CONCERN: What happened that made you call today?      Had an episode of tunnel vision, weakness, and near fainting yesterday afternoon at approx 3pm.  Developed left chest pain and rib cage.  Arms went numb and hands weak.   Went to sleep at 3pm yesterday.  Woke at 8:30am this morning.  Continues to have numbness and weakness in hands and continues to have left sided chest pain ranked as a 6-7.  Having SOB at rest, worse when walking.     2. ANXIETY SYMPTOM SCREENING: Can you describe how you have been feeling?  (e.g., tense, restless, panicky, anxious, keyed up, trouble sleeping, trouble concentrating)      Feeling anxious and had some issues with a friend yesterday.     3. ONSET: How long have you been feeling this way?      Yesterday afternoon    4. RECURRENT: Have you felt this way before?  If Yes, ask: What happened that time? What helped these feelings go away in the past?       Never happened before    5. RISK OF HARM - SUICIDAL IDEATION:  Do you ever have thoughts of hurting or killing yourself?  (e.g., yes, no, no but preoccupation with thoughts about death)    - INTENT:  Do you have thoughts of hurting or killing yourself right NOW? (e.g., yes, no, N/A)    - PLAN: Do you have a specific plan for how you would do this? (e.g., gun, knife, overdose, no plan, N/A)      No    6. RISK OF HARM - HOMICIDAL IDEATION:  Do you ever have thoughts of hurting or killing someone else?  (e.g., yes, no, no but preoccupation with thoughts about death)    - INTENT:  Do you have thoughts of hurting or killing someone right NOW? (e.g., yes, no, N/A)    - PLAN: Do you have a specific plan for how you would do this? (e.g., gun, knife, no plan, N/A)       No    7. FUNCTIONAL IMPAIRMENT: How have things been going for you overall? Have you had more difficulty than usual doing your normal daily activities?  (e.g., better, same, worse; self-care, school, work, interactions)      Very anxious afternoon with friend who caused problems.      8. SUPPORT: Who is with you now? Who do you live with? Do you have family or friends who you can talk to?       Fiance    9. THERAPIST: Do you have a counselor or  therapist? Name?  Yes, but cannot remember name        10. STRESSORS: Has there been any new stress or recent changes in your life?        See above    11. CAFFEINE USE: Do you drink caffeinated beverages, and how much each day? (e.g., coffee, tea, colas)        Drinks water.    12. ALCOHOL USE OR SUBSTANCE USE (DRUG USE): Do you drink alcohol or use any illegal drugs?        None - 15 months sober    13. OTHER SYMPTOMS: Do you have any other physical symptoms right now? (e.g., chest pain, palpitations, difficulty breathing, fever)        Still having SOB, dizzy, chest pain, numbness and weakness hands.    14. PREGNANCY: Is there any chance you are pregnant? When was your last menstrual period?        no    Answer Assessment - Initial Assessment Questions  1. SYMPTOM: What is the main symptom you are concerned about? (e.g., weakness, numbness)      Hands numb and tingling since yesterday.  Unable to hold anything in hand.     2. ONSET: When did this start? (minutes, hours, days; while sleeping)      Yesterday afternoon    3. LAST NORMAL: When was the last time you were normal (no symptoms)?      Before 3pm yesterday.     4. PATTERN Does this come and go, or has it been constant since it started?  Is it present now?      Constant.       5. CARDIAC SYMPTOMS: Have you had any of the following symptoms: chest pain, difficulty breathing, palpitations?      Chest pain yesterday and still today.     6. NEUROLOGIC SYMPTOMS: Have you had any of the following symptoms: headache, dizziness, vision loss, double vision, changes in speech, unsteady on your feet?      Dizzy swaying when walking.  Feels foggy    7. OTHER SYMPTOMS: Do you have any other symptoms?      SOB    8. PREGNANCY: Is there any chance you are pregnant? When was your last menstrual period?      no    Protocols used: No Contact or Duplicate Contact Call-ADULT-[place], Anxiety and Panic Attack-ADULT-[place], Neurologic Deficit-ADULT-[place]

## 2022-04-03 NOTE — Unmapped (Signed)
Regarding: TX:             Chest pain, fatigue, dizziness  ----- Message from Lorine Bears sent at 04/03/2022  9:53 AM EDT -----  Patient experiencing chest pain since yesterday with fatigue and dizziness. States she thinks she had a heart attack or stroke yesterday.    Appt sched 04/03/22     Please call patient asap

## 2022-04-23 NOTE — Unmapped (Unsigned)
The [facility] has made a second and final attempt to reach this patient to refill the following medication: Xifaxan.      We have left voicemails on the following phone numbers: [phone], have sent a MyChart message and have sent a text message to the following phone numbers: [phone].    Dates contacted: 6/9, 6/13  Last scheduled delivery: shipped 5/18    The patient may be at risk of non-compliance with this medication. The patient should call the [facility] at [phone]  Option 4, then Option 2 (all other specialty patients) to refill medication.    Westley Gambles   [facility] Specialty Technician

## 2022-04-24 NOTE — Unmapped (Signed)
[facility]  Physical Medicine and Rehabilitation     Patient Name:Erin Baxter  MRN: [id number]  DOB: August 08, 1976  Age: 46 y.o.     ----------------------------------------------------------------------------------------------------------------------  April 25, 2022 3:13PM. Documentation assistance provided by Winferd Humphrey, medical scribe, at the direction of Valeda Malm, MD.  ----------------------------------------------------------------------------------------------------------------------    ASSESSMENT & PLAN:     04/25/22     DIAGNOSIS:   Cervical spondylosis - suspect facet mediated pain with overlying myofascial pain exacerbated by very poor but correctable posture.    Chronic low back pain with right radiculitis without red flags and mild spondylosis on Xray.    TREATMENT PLAN:   Provided referral to Physical Therapy to work on neck strength and posture. You will be contacted to schedule this.     Reviewed recent Cervical X Ray with patient today, which showed cervical spondylosis.    Can consider Cervical/Lumbar MRI and injection if symptoms worsen or fail to improve after 6 weeks of Physical Therapy     Meds per PCP as she has trialed many at this point.    NEXT STEPS/FOLLOW UP: 12 weeks     Comprehensive patient education performed today explaining that the care plan will integrate multimodal activity-based rehabilitation techniques to enhance recovery, reduce pain, and facilitate functionality. Risks, benefits, and instructions for all medications prescribed / treatments offered  reviewed extensively with patient, who expresses understanding.  Patient strongly advised regarding red flag signs such as new or progressive motor weakness, sensory deficits, saddle anesthesia, bowel/bladder dysfunction, gait/coordination disturbance, weight loss, and night pain that should prompt evaluation at nearest ER.    A consultation report has been transmitted to the consult requesting physician.    SUBJECTIVE:     Chief Complaint:    Neck Pain (Left neck and shoulder) and Back Pain (Right buttock round hip down to mid thigh. Cramping in right foot)    History of Present Illness:   Erin Baxter is a 46 y.o. year old female being evaluated in consultation at the request of Deneise Lever, FNP for Neck Pain (Left neck and shoulder) and Back Pain (Right buttock round hip down to mid thigh. Cramping in right foot)  I have reviewed the referring providers note.     Chronic right sided low back pain with right sided sciatica and left sided neck pain. Pt states that she can only stand for 10-15 minutes at a time due to numbness from her right hip to her right toes stating that it feels like her leg might give out from under her. Due to this pt uses a cane to ambulate. Leg pain is a recent symptoms occurring for the last 2-3 months but is worsening rapidly over time. States that her right leg alternates between hot and hold and she is unable to lay on her right hip.     Currently pt reports ongoing left sided neck pain. She notes it has reached the point where she can not deal with her pain anymore. She reports feeling cracking in her neck and states that by the end of the day she has to laterally bend her neck to the right to try to relieve her pain. The only thing that can really relieve her pain is laying down. Denies numbness and tingling but endorses waking up in the morning with locked wrists and associated tingling in her hands which resolves soon after waking up.     Symptom Location: Low back and RLE  Symptom Character: tingling, stiffness  and aching  Symptom Onset/Mechanism: chronic (>/= 3 months)  Temporal Pattern: constant  Aggravating Factors: bending, twisting   Alleviating Factors: laying down   Night Pain: no  Unintended Weight Loss: no  Fever/Infection:no  Neuromotor Function: motor weakness - (no),  gait/coordination disturbance - (no), loss of bowel or bladder control - (no), saddle anesthesia - (no)     Prior Interventions/Modalities:  - Chiropractor   - Medrol Dosepak     Meds:  - Gabapentin     Home Exercise Program:  None     Prior Diagnostics:  XR Thoracic Spine 12/19/2021  IMPRESSION:   Normal radiographs of the thoracic spine.    XR Lumbar Spine 12/19/2021  IMPRESSION:   Mild L2-3 degenerative disc disease.    XR Cervical Spine 10/17/2021  IMPRESSION:   Alignment of the C-spine maintained. No fractures. Mild multilevel facet joint osteoarthritis changes involving the entire C-spine.    Current Outpatient Medications   Medication Sig Dispense Refill    amLODIPine (NORVASC) 10 MG tablet Take 1 tablet (10 mg total) by mouth daily. 90 tablet 3    dandelion root (DANDELION ORAL) Take by mouth. tea      ELDERBERRY FRUIT ORAL Take by mouth. Supplement  Also takes a [place] mushroom extract capsules that  could not find in the database to add to her med list      FLUoxetine (PROZAC) 40 MG capsule Take 1 capsule (40 mg total) by mouth daily. (Patient not taking: Reported on 04/03/2022) 90 capsule 1    gabapentin (NEURONTIN) 300 MG capsule Take 1 capsule (300 mg total) by mouth two (2) times a day. 60 capsule 3    hydrOXYzine (ATARAX) 25 MG tablet Take 1 tablet (25 mg total) by mouth every eight (8) hours as needed. 20 tablet 0    imiquimod (ALDARA) 5 % cream Apply to affected area three times weekly 12 packet 1    levothyroxine (SYNTHROID) 100 MCG tablet Take 1 tablet (100 mcg total) by mouth daily. 90 tablet 3    magnesium oxide (MAG-OX) 400 mg (241.3 mg elemental magnesium) tablet Take 1 tablet by mouth Two (2) times a day.      methylPREDNISolone (MEDROL DOSEPACK) 4 mg tablet Take 1 tablet (4 mg total) by mouth daily. follow package directions (Patient not taking: Reported on 04/03/2022) 1 each 0    multivitamins, therapeutic with minerals 9 mg iron-400 mcg tablet Take 1 tablet by mouth daily. 90 tablet 0    nicotine (NICODERM CQ) 21 mg/24 hr patch Place 1 patch on the skin daily as needed. (Patient not taking: Reported on 04/03/2022) 28 patch 3    pantoprazole (PROTONIX) 40 MG tablet Take 1 tablet (40 mg total) by mouth daily. 90 tablet 1    rifAXIMin (XIFAXAN) 550 mg Tab Take 1 tablet (550 mg total) by mouth Two (2) times a day. (Patient not taking: Reported on 02/20/2022) 60 tablet 11     No current facility-administered medications for this visit.       Allergies:   Patient has no known allergies.    Past Medical / Surgical History:     Past Medical History:   Diagnosis Date    Disease of thyroid gland     ETOH abuse     Pancreatitis        Past Surgical History:   Procedure Laterality Date    PR UPPER GI ENDOSCOPY,DIAGNOSIS N/A 02/01/2021    Procedure: UGI ENDO, INCLUDE ESOPHAGUS, STOMACH, & DUODENUM &/  OR JEJUNUM; DX W/WO COLLECTION SPECIMN, BY BRUSH OR WASH;  Surgeon: Marene Lenz, MD;  [facility];  Service: Gastroenterology    TUBAL LIGATION         Social History     Socioeconomic History    Marital status: Single   Tobacco Use    Smoking status: Every Day     Packs/day: 1.50     Types: Cigarettes    Smokeless tobacco: Current     Types: Chew    Tobacco comments:     1-2ppd. Hopes to cut back   Vaping Use    Vaping Use: Never used   Substance and Sexual Activity    Alcohol use: Yes     Comment: 1/5 a day of liquor    Drug use: No     Social Determinants of Health     Financial Resource Strain: High Risk    Difficulty of Paying Living Expenses: Hard   Food Insecurity: Food Insecurity Present    Worried About Programme researcher, broadcasting/film/video in the Last Year: Often true    Barista in the Last Year: Often true   Transportation Needs: No Engineer, petroleum (Medical): No    Lack of Transportation (Non-Medical): No       Family History   Problem Relation Age of Onset    Thyroid disease Mother     Heart attack Father     Thyroid disease Sister     Liver disease Brother        For any of the above entries which indicate no records on file, the patient reports no relevant history.                 Review of Systems:   Review of Systems was completed through a 10 organ system review and is listed in the chart.  Pertinent positives are noted in HPI or flowsheet and otherwise negative.  Patient has been instructed to followup with PCP or appropriate specialist for symptoms outside the purview of this speciality.    OBJECTIVE:     Vitals:     Temp 36.8 ??C (98.2 ??F)  - Ht 171.5 cm (5' 7.5)  - Wt 79.3 kg (174 lb 12.8 oz)  - LMP  (LMP Unknown)  - BMI 26.97 kg/m??     The above medications, allergies, history, and ROS, and vitals have been reviewed.    Physical Exam:   GEN: alert and oriented, no apparent distress  HEENT: normocephalic, atraumatic, anicteric, moist mucous membranes  CV: normal heart rate  PULM: normal work of breathing  GI: nondistended  EXT: no swelling, edema in b/l UE and LE  SKIN: no visible ecchymosis or breakdown  PSYCH: normal mood and affect    NEURO:   Manual Muscle Testing    Left Upper Extremity Right Upper Extremity   Elbow Flexion 5/5 5/5   Elbow Extension  5/5 5/5   Wrist Extension  5/5 5/5   Finger Abduction  5/5 5/5         Reflexes     Left Upper Extremity Right Upper Extremity    Triceps 2+ 2+   Biceps 1+ 1+   Brachioradialis 1+ 1+   Hoffman's  negative negative     Sensory  Sensation to light touch WNL throughout b/l UE and LE    MSK:  Gait and Station: normal nonantalgic gait with symmetric body posture  Cervical Spine:   Inspection: Normal alignment. No erythema, discoloration, or asymmetry.  Palpation: TTP: L cervical psp   No vertebral body point tenderness.  ROM: normal flexion, extension, rotation, pain with left lateral bend    ----------------------------------------------------------------------------------------------------------------------  April 25, 2022 3:13 PM. Documentation assistance provided by Winferd Humphrey, medical scribe, at the direction of Valeda Malm, MD.  ----------------------------------------------------------------------------------------------------------------------      Tia Masker, MD  Assistant Professor - PM&R  Musculoskeletal and Spine Specialist - [facility]

## 2022-04-25 ENCOUNTER — Ambulatory Visit
Admit: 2022-04-25 | Discharge: 2022-04-26 | Payer: MEDICAID | Attending: Physical Medicine & Rehabilitation | Primary: Physical Medicine & Rehabilitation

## 2022-04-25 DIAGNOSIS — M5441 Lumbago with sciatica, right side: Principal | ICD-10-CM

## 2022-04-25 DIAGNOSIS — M47812 Spondylosis without myelopathy or radiculopathy, cervical region: Principal | ICD-10-CM

## 2022-04-25 DIAGNOSIS — G8929 Other chronic pain: Principal | ICD-10-CM

## 2022-04-25 NOTE — Unmapped (Addendum)
Thanks so much for coming to see Dr. Riccardo Dubin today. It was a pleasure to meet you. This summary reviews the goals and plans we discussed at your visit today.     Below, you will see: a) your working diagnosis b) your treatment plan and c) your next steps and followup plan.    We care about your quality of life and are committed to helping optimize your functionality.     DIAGNOSIS:   Cervical spondylosis - suspect facet mediated pain with overlying myofascial pain exacerbated by very poor but correctable posture.    Chronic low back pain with right radiculitis without red flags and mild spondylosis on Xray.    TREATMENT PLAN:   Provided referral to Physical Therapy to work on neck strength and posture. You will be contacted to schedule this.     Reviewed recent Cervical X Ray with patient today, which showed cervical spondylosis.    Can consider Cervical/Lumbar MRI and injection if symptoms worsen or fail to improve after 6 weeks of Physical Therapy     Meds per PCP as she has trialed many at this point.    NEXT STEPS/FOLLOW UP: 12 weeks

## 2022-04-29 ENCOUNTER — Telehealth: Admit: 2022-04-29 | Discharge: 2022-04-30 | Payer: MEDICAID | Attending: Mental Health | Primary: Mental Health

## 2022-04-29 DIAGNOSIS — F1021 Alcohol dependence, in remission: Principal | ICD-10-CM

## 2022-04-29 DIAGNOSIS — F411 Generalized anxiety disorder: Principal | ICD-10-CM

## 2022-04-29 DIAGNOSIS — F332 Major depressive disorder, recurrent severe without psychotic features: Principal | ICD-10-CM

## 2022-04-29 NOTE — Unmapped (Signed)
Visit not completed - patient is outside of [place] today - will be returning to [facility].  Patient rescheduled for 05/07/22.

## 2022-04-30 ENCOUNTER — Ambulatory Visit: Admit: 2022-04-30 | Discharge: 2022-05-01 | Payer: MEDICAID

## 2022-04-30 DIAGNOSIS — K7031 Alcoholic cirrhosis of liver with ascites: Principal | ICD-10-CM

## 2022-04-30 LAB — CBC
HEMATOCRIT: 38.8 % (ref 34.0–44.0)
HEMOGLOBIN: 13.1 g/dL (ref 11.3–14.9)
MEAN CORPUSCULAR HEMOGLOBIN CONC: 33.7 g/dL (ref 32.0–36.0)
MEAN CORPUSCULAR HEMOGLOBIN: 30 pg (ref 25.9–32.4)
MEAN CORPUSCULAR VOLUME: 89.2 fL (ref 77.6–95.7)
MEAN PLATELET VOLUME: 10.1 fL (ref 6.8–10.7)
PLATELET COUNT: 205 10*9/L (ref 150–450)
RED BLOOD CELL COUNT: 4.35 10*12/L (ref 3.95–5.13)
RED CELL DISTRIBUTION WIDTH: 15.2 % (ref 12.2–15.2)
WBC ADJUSTED: 9.3 10*9/L (ref 3.6–11.2)

## 2022-04-30 LAB — BASIC METABOLIC PANEL
ANION GAP: 7 mmol/L (ref 5–14)
BLOOD UREA NITROGEN: 16 mg/dL (ref 9–23)
BUN / CREAT RATIO: 22
CALCIUM: 9.2 mg/dL (ref 8.7–10.4)
CHLORIDE: 107 mmol/L (ref 98–107)
CO2: 28 mmol/L (ref 20.0–31.0)
CREATININE: 0.72 mg/dL
EGFR CKD-EPI (2021) FEMALE: >90 mL/min/{1.73_m2} (ref >=60)
GLUCOSE RANDOM: 87 mg/dL (ref 70–179)
POTASSIUM: 4.2 mmol/L (ref 3.5–5.1)
SODIUM: 142 mmol/L (ref 135–145)

## 2022-04-30 LAB — HEPATIC FUNCTION PANEL
ALBUMIN: 4.1 g/dL (ref 3.4–5.0)
ALKALINE PHOSPHATASE: 125 U/L — ABNORMAL HIGH (ref 46–116)
ALT (SGPT): 13 U/L (ref 10–49)
AST (SGOT): 20 U/L (ref <=34)
BILIRUBIN DIRECT: <0.1 mg/dL (ref 0.00–0.30)
BILIRUBIN TOTAL: 0.3 mg/dL (ref 0.3–1.2)
PROTEIN TOTAL: 7.1 g/dL (ref 5.7–8.2)

## 2022-04-30 LAB — PROTIME-INR
INR: 1.04
PROTIME: 11.8 s (ref 9.8–12.8)

## 2022-04-30 LAB — AFP TUMOR MARKER: AFP-TUMOR MARKER: 5 ng/mL (ref <=8)

## 2022-04-30 NOTE — Unmapped (Unsigned)
[facility]  Return visit  04/30/2022    Erin Baxter is a 46 y.o. 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     Assessment/Plan:      46 y.o. female alcohol-associated cirrhosis presenting for follow-up. She has been abstinent from alcohol for > 1 year now. She initially had some ascites but with abstinence she has had recompensation of her liver disease. Her MELD-NA is low at 7 so liver transplant evaluation is not needed at this time. Advised her to continue abstinence from alcohol. She reports some worsening abdominal distension recently but no obvious ascites on exam. She is due for [place] for HCC screening and if ascites present then can start low dose diuretics. With her re compensation, it is unclear if she has clinically significant portal hypertension. Will order fibroscan to further evaluate and if > 25kPa then would initiate carvedilol to prevent hepatic decompensation.     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 MELD labs today.  ??? HCC surveillance with imaging and AFP every 6 months.  ??? Fibroscan ordered, if > 25kPa then start carvedilol to prevent hepatic decompensation  ?? EGD 02/01/2021: no esophageal varices.  Repeat in 01/2023 (will not need if has CSPH and is started on carvedilol)  ??? Vaccinations: We recommend that patients have vaccinations to prevent various infections that can occur, especially in the setting of having underlying liver disease. The following vaccinations should be given:  ??? -Hepatitis A/B: She has received Twinrix x 3  ??? -Influenza (yearly): Patient declined.  ??? -Pneumococcal: Patient declined.  ??? -Zoster: Patient declined.  ??? -SARS-CoV-2: Patient declined.     Alcohol use disorder, severe, dependence (CMS-HCC)  ??? Previously declined counseling/resources, but has remained sober for > 1 year    Depression:  ?? Advised her to discuss this with her PCP  ?? SSRIs are considered safe in patients with liver disease     Possible Hepatic Encephalopathy:   ??? Continue rifaximin monotherapy.     Tobacco abuse  ??? Advised to quit smoking.     Return in about 6 months (around 10/30/2022).     Patient seen and discussed with Dr. Eudelia Bunch.     Hyman Bible, MD  Advanced/Transplant Hepatology Fellow, PGY-6  [facility]      Subjective   History of Present Illness   She is a 47 y.o. female with severe alcohol use disorder and alcohol-associated cirrhosis. Multiple ED visits and hospitalizations with intoxication, withdrawal, or seeking alcohol detoxification. History of alcohol withdrawal seizure. She was hospitalized at [facility] December 2021 after presenting for detox. Ultrasound showed cirrhosis. No history of GI bleeding or paracentesis.    Interval history:  She remains abstinent from alcohol. Last drink was > 1 year ago. Reports forgetfulness is stable. Denies confusion. Thinks her abdomen is more swollen. She is not on diuretics and has not required a paracentesis recently. Her depression is not well controlled. She was on prozac but stopped this recently. She has an appointment with her PCP next month and will discuss starting another medication with them.     Objective   Physical Exam   Vital Signs: BP 104/69 (BP Site: R Arm, BP Position: Sitting, BP Cuff Size: Large)  - Pulse 80  - Temp 35.8 ??C (96.4 ??F) (Tympanic)  - Ht 170.2 cm (5' 7)  - Wt 78.5 kg (173 lb 1.6 oz)  - LMP  (LMP Unknown)  -  SpO2 100%  - BMI 27.11 kg/m??     Constitutional: well appearing  Eyes: anicteric sclerae  Abdomen: soft, non-distended, non-tender. No obvious ascites  Extremities: Warm, No edema  Skin: No jaundice  Neuro: Alert, Oriented x 3, No asterixis.     Labs:    MELD-Na: 7 at 11/01/2021  4:33 PM  MELD: 7 at 11/01/2021  4:33 PM  Calculated from:  Serum Creatinine: 0.63 mg/dL (Using min of 1 mg/dL) at 16/08/9603  5:40 PM  Serum Sodium: 140 mmol/L (Using max of 137 mmol/L) at 11/01/2021  4:33 PM  Total Bilirubin: 0.3 mg/dL (Using min of 1 mg/dL) at 98/09/9146  8:29 PM  INR(ratio): 1.09 at 11/01/2021  4:33 PM    Imaging:  [place] abd 12/03/21  Heterogenous liver with undulating borders, likely representing chronic liver disease.  No focal liver lesion identified.        LI-RADS ultrasound category: [place] 1- negative.       Visualization score: A (minimal limitations)     GI procedures:  EGD 01/2021: normal, no varices      Independent interpretation: I independently interpreted [place] abd dated 12/03/21, notable for no liver lesions.

## 2022-04-30 NOTE — Unmapped (Signed)
[facility]  [facility]  Hyman Bible, MD   [facility]  [address]  [place], [place] [address]  Main Clinic: [phone]  Appointment Schedulers: [phone]  Theophilus Kinds, RN: [phone]  Fax: [phone]       Thank you for allowing me to participate in your medical care today. Here are my recommendations based on today's visit:    You were seen today for alcohol-associated cirrhosis and an alcohol use disorder  Call [phone] option 1 to schedule your imaging study at [facility]  Abstain from all alcohol (including non-alcoholic beer, medications that include alcohol such as cough/cold/flu medications, mouthwash containing alcohol, cooking with wine, kombucha containing alcohol, etc)  Get an ultrasound of your liver every 6 months to look for early signs of liver cancer  Limit salt (sodium) intake to less than 2000 mg per day  Increase your protein intake. Eat small, frequent meals, high-protein late-night snacks, and use protein supplements such as Ensure or Boost  Avoid NSAIDs (ibuprofen, naproxen, Advil, Motrin, Aleve, Naprosyn, etc). Acetaminophen (Tylenol) up to 2000 mg a day is ok  You should quit smoking cigarettes    Take care,  Hyman Bible, MD

## 2022-05-02 LAB — PHOSPHATIDYLETHANOL (PETH): PETH 16:0/18:1 (POPETH) BY LC-MS/MS: <10 ng/mL

## 2022-05-07 ENCOUNTER — Telehealth: Admit: 2022-05-07 | Discharge: 2022-05-08 | Payer: MEDICAID | Attending: Mental Health | Primary: Mental Health

## 2022-05-07 DIAGNOSIS — F411 Generalized anxiety disorder: Principal | ICD-10-CM

## 2022-05-07 DIAGNOSIS — F1021 Alcohol dependence, in remission: Principal | ICD-10-CM

## 2022-05-07 DIAGNOSIS — F332 Major depressive disorder, recurrent severe without psychotic features: Principal | ICD-10-CM

## 2022-05-07 NOTE — Unmapped (Signed)
RETURN BEHAVIORAL HEALTH VISIT    60 minutes Duration, Face-to-Face Time (Video)    Date of Service:  05/07/2022     Service: Individual therapy with patient, Face-to-Face (Video)  Individuals Present: Patient, Therapy Provider      PHQ-9 completed during this session. PHQ-9 TOTAL SCORE: 24  Purpose of screener discussed and score explained to patient.    Did you complete Depression Screening Review Today (if copying forward your note you must delete and re-enter smartphrase): Yes    PHQ-9 Score: 24      Screening complete, depression identified / today's follow-up action documented in note      PHQ-9 PHQ-9 TOTAL SCORE   05/07/2022  10:00 AM 24   02/20/2022   1:00 PM 20   12/18/2021   8:00 AM 23   12/06/2021   1:00 PM 22   09/05/2021   3:00 PM 19         GAD-7 completed during this session. GAD-7 Total Score: 21 Purpose of screener discussed and score explained to patient.    GAD7 Total Score GAD-7 Total Score   05/07/2022  10:00 AM 21   02/20/2022   1:00 PM 15   12/18/2021   8:00 AM 21   12/06/2021   1:00 PM 9   09/05/2021   3:00 PM 19           Goals:  managing Generalized Anxiety Disorder by increasing number of times feeling calm in her mind/body from 0 times per week to 5 times per week or more over the next 4-6 months, managing Generalized Anxiety Disorder and Major Depressive Disorder by decreasing emotional reactivity (e.g. mood swings) from several times per week to once per week or less over the next 4-6 months, and managing Generalized Anxiety Disorder and Alcohol Use Disorder in Sustained Remission by increasing number of times talking to others without feeling stupid from 0 times per week to 3 times per week or more over the next 4-6 months       Intervention:  Motivational Interviewing  Cognitive Behavioral Therapy  Mindfulness  Feelings Processing  5-4-3-2-1 Grounding Exercise  Treatment Planning      Effectiveness:  (Patient's progress towards goals)   Patient has made limited progress towards increasing number of times feeling calm in her mind/body as evidenced by patient's report that she has been feeling constantly jittery/anxious and having chest pain every morning (consulted with triage nurse and believes she is having anxiety attacks). Patient was engaged in session - appeared anxious and depressed - appeared calmer towards end of visit. Patient reports significant depression and anxiety symptoms over the past few weeks. Patient reports that she has been in daily pain and unable to do activities that require standing for long periods. Patient reports feeling guilty about her alcohol use leading to health issues and patient is feeling hopeless about the future - unable to work and has not wanted to be around other people. Patient discussed seeing her PCP more frequently to better address her depression/anxiety and physical pain. Patient processed her feelings about recent stressors. Patient engaged in doing 5-4-3-2-1 Grounding Exercise and reported feeling calmer after doing exercise. Patient discussed treatment planning including starting to track her Mood and Activity levels and starting Acceptance and Commitment Therapy for Chronic Pain and health issues at the next visit.      Plan: (What will be addressed in future sessions?)  Patient to follow up with this provider in 2-3 weeks.  Patient to review  5-4-3-2-1 Grounding Exercise and Mood and Activity Tracker and practice doing grounding exercise each day and write down her mood symptoms at different times during the day.  05/07/22 - Patient reports that she weaned herself off Fluoxetine (Prozac) 40 mg daily and Hydroxyzine (Atarax) 25 mg every 8 hours as needed due to concerns about how it might impact her liver (says that liver doctor told her it could be okay to take 40 mg but not a higher dosage. Staff encouraged patient to follow up with PCP to discuss medication concerns and determine which options will work best to help with Depression/anxiety symptoms and not cause further liver issues.       Safety Screening    Suicide Risk Factors   EMB Suicide Risk: A suicide risk assessment was performed during this evaluation. This patient is not deemed to be at current risk for suicide.   Denies SI, Hopelessness, Chronic, severe medical condition, Agitated, Current diagnosis of MDD, and Trauma history/PTSD    Violence Risk Factors  A violence risk assessment was performed during this assessment. This patient was not deemed to be at elevated risk for violence.  Violence Risk: Denies HI    Mitigating Factors  These risk factors are mitigated by the following factors:  No known access to weapons, No history of previous suicide attempts, No history of violence, Motivation for treatment, Supportive family, Sense of responsibility to family and social supports, Sense of belonging, Presence of any significant relationship, Presence of an available support system, Expresses purpose for living, Religious or spiritual prohibition to suicide or violence, and Current treatment compliance    Community Resource Needs  Patient was given local [facility] contact information and a brief explanation of the [facility] system.       Emergency Resources  [facility]/[facility] county specific crisis resources have been added to the AVS when necessary    Additional Resources Available 24 Hours a Day:  National Suicide Prevention Hotline (702)052-1030  Hopeline Sheridan [phone]    If patient doesn't engage in counseling here and/or local supports are indicated, please give patient information on linkage to local services, 24 hour crisis, mobile crisis supports, and their local crisis assessment center.    Number of behavioral health visits in the last 18 months : 1    If patient has [employer] or [employer] (if applicable), billing was switched to [employer]: N/A      ----------------  Therapy Session Provided by Behavioral Health Staff:  Arlina Robes, LCSW        PCP: Deneise Lever, FNP  Address on File: [address] [place] [address], [facility] [place]      Verified as Current Location: Yes  Extended Emergency Contact Information  Primary Emergency Contact: Doughtie,Samuel  Address: [phone]           [place], [place] [address] [place]  Home Phone: [phone]  Mobile Phone: [phone]  Relation: Son  Secondary Emergency Contact: Richardson,Kimberly  Home Phone: [phone]  Mobile Phone: [phone]  Relation: Sister  Interpreter needed? No     Verified Behavioral Health Emergency Contact: Primary      The patient reports they are currently: at home. I spent 60 minutes on the real-time audio and video with the patient on the date of service. I spent an additional 30 minutes on pre- and post-visit activities on the date of service.     The patient was physically located in [place] or a  state in which I am permitted to provide care. The patient and/or parent/guardian understood that s/he may incur co-pays and cost sharing, and agreed to the telemedicine visit. The visit was reasonable and appropriate under the circumstances given the patient's presentation at the time.    The patient and/or parent/guardian has been advised of the potential risks and limitations of this mode of treatment (including, but not limited to, the absence of in-person examination) and has agreed to be treated using telemedicine. The patient's/patient's family's questions regarding telemedicine have been answered.     If the visit was completed in an ambulatory setting, the patient and/or parent/guardian has also been advised to contact their provider???s office for worsening conditions, and seek emergency medical treatment and/or call 911 if the patient deems either necessary.

## 2022-05-08 ENCOUNTER — Ambulatory Visit: Admit: 2022-05-08 | Discharge: 2022-05-09 | Payer: MEDICAID

## 2022-05-08 DIAGNOSIS — K7030 Alcoholic cirrhosis of liver without ascites: Principal | ICD-10-CM

## 2022-05-08 NOTE — Unmapped (Signed)
[place] DIVISION OF GASTROENTEROLOGY AND HEPATOLOGY  [facility]    FIBROSCAN will be performed to assess hepatic fibrosis (scarring) in order to stage this patient's liver disease. This will assist with evaluating the natural course of the disease and will provide important information regarding prognosis, duration of therapy, and potential response to treatment. This information will also help assess risk for hepatocellular carcinoma and need for liver cancer surveillance.    FibroscanProcedure:  After obtaining verbal consent, the patient was placed in a supine position. Physical characteristics, body habitus, and landmarks were assessed to establish appropriate midaxillary intercostal space for probe placement.    FibroScan 630 Expert Serial # [id number]    Probe  [x]  M+ Serial # [id number]  []  XL+ Serial # [id number]    Main Etiology of Liver Disease:  [] Hepatitis C (HCV)     [] Hepatitis B (HBV)  [x] Alcohol  [] NAFLD     [] PBC       [] PSC  [] Autoimmune Hepatitis       [] Elevated hepatic enzymes       [] Other                                    Skin to liver capsule distance and liver parenchyma were accessed during the entire examination with the FibroScan probe. The patient was instructed to breathe normally and to abstain from sudden movements during the procedure, allowing for random measurements of liver stiffness. 50Hz  shear wave pulses were applied and the resulting shear wave and propagation speed detected with a 3.5 MHz ultrasonic signal, using the FibroScan probe. At least ten Shear Waves were produced; individual measurements of each shear wave were calculated. Patient tolerated the procedure well and was discharged without incident.    FibroScan score __14.7__ kPa   IQR/Med __7__ %   CAP __226__ dB/m     Test performed by: Glean V. RMA       --------------------------------------------------------------------  RESULTS REPORT - Physician???s interpretation    Estimation of the stage of liver fibrosis (Metavir Score): The results of the Liver Stiffness Score are consistent with the following liver fibrosis stage:    []  F0-F1  []  F2   []  F3   [x] F4           GENERAL RECOMMENDATIONS ACCORDING TO THE STAGE OF LIVER FIBROSIS.    F0-F1: No-minimal fibrosis. The risk of progression to advanced fibrosis and cirrhosis is low. If the cause of liver disease is not removed, a 1-2 yr follow-up study is recommended. F2: Significant fibrosis. There is a moderate risk of progression to cirrhosis. If the cause of liver disease is not removed, a follow-up study in 12 months is recommended.  F3: Advanced (pre-cirrhotic stage). The risk of progression to cirrhosis is high. Imaging studies to rule out hepatocellular carcinoma should be considered. Efforts to remove the cause of liver disease are highly recommended.  F4: Cirrhosis. There is significant risk of portal hypertension and esophageal varices. An upper endoscopy is recommended. Imaging studies for hepatocellular carcinoma screening are recommended.    Any and all FibroScan studies must be carefully evaluated, taking fully into account all individual measurement/scans, patient history and other factors. As with liver biopsy, any estimation of liver fibrosis may be subject to under or over staging due to sampling error. Any further medical or surgical intervention should be made only while fully considering the circumstances of this patient and in consultation  with this patient.    I have reviewed and interpreted the FIBROSCAN test results as described above.

## 2022-05-21 ENCOUNTER — Ambulatory Visit: Admit: 2022-05-21 | Payer: MEDICAID | Attending: Mental Health | Primary: Mental Health

## 2022-05-22 NOTE — Unmapped (Signed)
Patient did not show for today's behavioral health appointment. LCSW called patient in hopes of discussing this missed appointment and to reschedule, and patient did not answer.  Treatment is now discontinued until patient returns call.

## 2022-05-27 DIAGNOSIS — G8929 Other chronic pain: Principal | ICD-10-CM

## 2022-05-27 DIAGNOSIS — M25512 Pain in left shoulder: Principal | ICD-10-CM

## 2022-05-27 DIAGNOSIS — M549 Dorsalgia, unspecified: Principal | ICD-10-CM

## 2022-05-27 DIAGNOSIS — M542 Cervicalgia: Principal | ICD-10-CM

## 2022-05-29 DIAGNOSIS — A630 Anogenital (venereal) warts: Principal | ICD-10-CM

## 2022-05-29 DIAGNOSIS — E039 Hypothyroidism, unspecified: Principal | ICD-10-CM

## 2022-05-29 MED ORDER — IMIQUIMOD 5 % TOPICAL CREAM PACKET
PACK | 1 refills | 0 days
Start: 2022-05-29 — End: ?

## 2022-05-29 MED ORDER — LEVOTHYROXINE 100 MCG TABLET
ORAL_TABLET | 3 refills | 0 days
Start: 2022-05-29 — End: ?

## 2022-05-30 MED ORDER — LEVOTHYROXINE 100 MCG TABLET
ORAL_TABLET | 0 refills | 0 days | Status: CP
Start: 2022-05-30 — End: ?

## 2022-05-30 MED ORDER — IMIQUIMOD 5 % TOPICAL CREAM PACKET
PACK | 1 refills | 0 days | Status: CP
Start: 2022-05-30 — End: ?

## 2022-05-30 NOTE — Unmapped (Signed)
Patient is requesting the following refill  Requested Prescriptions     Pending Prescriptions Disp Refills    levothyroxine (SYNTHROID) 100 MCG tablet [Pharmacy Med Name: LEVOTHYROXINE 100 MCG TABLET] 90 tablet 3     Sig: TAKE 1 TABLET BY MOUTH EVERY DAY    imiquimod (ALDARA) 5 % cream [Pharmacy Med Name: IMIQUIMOD 5% CREAM PACKET] 12 packet 1     Sig: APPLY TO AFFECTED AREA THREE TIMES WEEKLY       Recent Visits  Date Type Provider Dept   04/03/22 Office Visit Desmond Dike, NP [facility] [address] [facility]   02/20/22 Office Visit Deneise Lever, FNP [facility] [address] [facility]   12/06/21 Office Visit Deneise Lever, FNP [facility]   09/05/21 Office Visit Deneise Lever, FNP [facility]   07/06/21 Office Visit Deneise Lever, FNP [facility]   Showing recent visits within past 365 days with a meds authorizing provider and meeting all other requirements  Future Appointments  Date Type Provider Dept   07/03/22 Appointment Deneise Lever, FNP [facility] [address] [facility]   Showing future appointments within next 365 days with a meds authorizing provider and meeting all other requirements

## 2022-06-13 ENCOUNTER — Ambulatory Visit: Admit: 2022-06-13 | Discharge: 2022-06-14 | Payer: MEDICAID

## 2022-06-17 NOTE — Unmapped (Signed)
Patient did not show for today's behavioral health appointment. LCSW called patient in hopes of discussing this missed appointment and to reschedule, and patient did not answer.  Treatment is now discontinued until patient returns call.

## 2022-06-20 ENCOUNTER — Ambulatory Visit: Admit: 2022-06-20 | Payer: MEDICAID

## 2022-07-03 ENCOUNTER — Ambulatory Visit: Admit: 2022-07-03 | Discharge: 2022-07-04 | Payer: MEDICAID

## 2022-07-03 DIAGNOSIS — F332 Major depressive disorder, recurrent severe without psychotic features: Principal | ICD-10-CM

## 2022-07-03 DIAGNOSIS — F411 Generalized anxiety disorder: Principal | ICD-10-CM

## 2022-07-03 DIAGNOSIS — G8929 Other chronic pain: Principal | ICD-10-CM

## 2022-07-03 MED ORDER — SERTRALINE 50 MG TABLET
ORAL_TABLET | Freq: Every day | ORAL | 1 refills | 30 days | Status: CP
Start: 2022-07-03 — End: 2023-07-03

## 2022-07-03 NOTE — Unmapped (Signed)
Assessment and Plan:     Erin Baxter was seen today for anxiety.    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  Self-care: Exercise, healthy diet, stress management, sleep hygiene, social connections.    Other chronic pain    -Has upcoming PT appointment (believes will be able to attend, despite social anxiety), spine center and chronic pain clinic appointments  -Will review form and complete for patient  -Pt verbalized understanding, agreeable with plan of care    Declined pneumovax today    Barriers to recommended plan: transportation    Return if symptoms worsen or fail to improve, for 1-2 months.      Subjective:     HPI: Erin Baxter is a 46 y.o. female here for Anxiety.    Anxiety:  'really bad', does not want to be around any one, left finance, socially isolated  Pt feels 'Non-existent', does not care to eat. 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  Social anxiety-reason she missed PT appointment, aware therapy helpful but found it frustrating  Small dog is helpful  1 year, 8 months sober--despite poor mental health, not tempted to drink  Tried prozac-stopped due to weight gain  Uncertain about other medications for mental health    Chronic pain:  Cannot stand on RLE for more than 20 minutes, numbness and 'firey-cold pain', starts in R hip and R buttock, feels RLE gives way  No pain in left leg, has had to use cane to walk, several falls in past  In morning, R hand-weak, hands will not open, has to pry open B hands  Electrical current in left hand, LUE, left neck--hard time holding objects in left hand  Has appointment with spine center and MRI  Chronic pains Started 2021, initially with  neck pain, then pain increased in left shoulder  Would appreciate form for lawyer covering disability claim to be completed.    I have reviewed past medical, surgical, medications, allergies, social and family histories today and updated them in Epic where appropriate.    ROS:     PHQ-9 PHQ-9 TOTAL SCORE   07/03/2022   1:00 PM 24   05/07/2022  10:00 AM 24   02/20/2022   1:00 PM 20   12/18/2021   8:00 AM 23   12/06/2021   1:00 PM 22      GAD7 Total Score GAD-7 Total Score   07/03/2022   1:00 PM 21   05/07/2022  10:00 AM 21   02/20/2022   1:00 PM 15   12/18/2021   8:00 AM 21   12/06/2021   1:00 PM 9       Review of systems negative unless otherwise noted as per HPI.      Objective:     Vitals:    07/03/22 1338   BP: 120/68   Pulse: 74   Temp: 36.6 ??C (97.8 ??F)   SpO2: 97%     Body mass index is 26.88 kg/m??.    Physical Exam  Vitals and nursing note reviewed.   Constitutional:       Comments: Looks fatigued   HENT:  Head: Normocephalic and atraumatic.   Skin:     Comments: Bronzed skin   Psychiatric:      Comments: Flat affect, non animated, appropriate with examiner            Medication adherence and barriers to the treatment plan have been addressed. Opportunities to optimize healthy behaviors have been discussed. Patient / caregiver voiced understanding.   I personally spent 35 minutes face-to-face and non-face-to-face in the care of this patient, which includes all pre, intra, and post visit time on the date of service.  Cleon Dew, DNP, FNP-C  [facility]  [phone] (F)    Note - This record has been created using [employer]. Chart creation errors have been sought, but may not always have been located. Such creation errors do not reflect on the standard of medical care.

## 2022-07-04 NOTE — Unmapped (Signed)
Disability forms for law firm completed, awaiting review by supervising physician.    We will contact patient and fax to legal office when forms complete.

## 2022-07-18 ENCOUNTER — Ambulatory Visit
Admit: 2022-07-18 | Discharge: 2022-07-19 | Payer: MEDICAID | Attending: Physical Medicine & Rehabilitation | Primary: Physical Medicine & Rehabilitation

## 2022-07-18 NOTE — Unmapped (Addendum)
DIAGNOSIS:   Cervical spondylosis - suspect facet mediated pain with overlying myofascial pain exacerbated by very poor but correctable posture.    Chronic low back pain with lumbar radiculopathy without red flags and mild spondylosis on Xray.    TREATMENT PLAN:   Start Physical Therapy to work on neck strength and posture scheduled for 9/26    Can consider Cervical/Lumbar MRI and injection if symptoms worsen or fail to improve after 6 weeks of Physical Therapy     Meds per PCP; Recommend stop taking BC powder    NEXT STEPS/FOLLOW UP: 3 months

## 2022-07-18 NOTE — Unmapped (Unsigned)
[facility]  Physical Medicine and Rehabilitation     Patient Name:Erin Baxter  MRN: [id number]  DOB: 09-Nov-1976  Age: 46 y.o.     ASSESSMENT & PLAN:     07/18/22     DIAGNOSIS:   Cervical spondylosis - suspect facet mediated pain with overlying myofascial pain exacerbated by poor posture but correctable with therapies.    Chronic low back pain with right lumbar radiculitis without red flags and mild spondylosis on Xray.    TREATMENT PLAN:   Physical Therapy to work on neck strength and posture. Scheduled for first session on 08/06/22.    Consider Cervical/Lumbar MRI and injection if symptoms worsen or fail to improve after 6 weeks of Physical Therapy     Meds per PCP; recommend discontinuing BC powder    NEXT STEPS/FOLLOW UP: 3 months    SUBJECTIVE:     Chief Complaint:    Neck Pain, Numbness, Arm Pain, hand numbness, Back Pain, and Extremity Weakness      History of Present Illness:   Erin Baxter is a 46 y.o. year old female being evaluated in consultation at the request of Deneise Lever, FNP for Neck Pain, Numbness, Arm Pain, hand numbness, Back Pain, and Extremity Weakness  I have reviewed the referring providers note.     07/18/2022  Pt presents in f/u after referral to PT. Today pt reports similar symptoms from prior visit with continued left sided neck pain radiating to the left arm with associated numbness and tingling. Patient also having lower back pain radiating to her right leg. She reports numbness, tingling, and a cold sensation to her right leg with long periods of standing or walking. Plan from last visit was to start PT but patient unable to get appointment until the end of this month. Symptoms are the same and patient denies worsening symptoms. She has minimal options for pain relief due to history of liver disease and recently only taking BC powder for pain.      Symptom Location: Low back, RLE, and LUE  Symptom Character: tingling, stiffness and aching  Symptom Onset/Mechanism: chronic (>/= 3 months)  Temporal Pattern: constant  Aggravating Factors: bending, twisting   Alleviating Factors: lying down   Night Pain: no  Unintended Weight Loss: no  Fever/Infection:no  Neuromotor Function: motor weakness - (no),  gait/coordination disturbance - (no), loss of bowel or bladder control - (no), saddle anesthesia - (no)     Prior Interventions/Modalities:  - Chiropractor   - Medrol Dosepak     Meds:  - Gabapentin   - BC powder    Home Exercise Program:  None     Prior Diagnostics:  XR Thoracic Spine 12/19/2021  IMPRESSION:   Normal radiographs of the thoracic spine.    XR Lumbar Spine 12/19/2021  IMPRESSION:   Mild L2-3 degenerative disc disease.    XR Cervical Spine 10/17/2021  IMPRESSION:   Alignment of the C-spine maintained. No fractures. Mild multilevel facet joint osteoarthritis changes involving the entire C-spine.    Current Outpatient Medications   Medication Sig Dispense Refill   ??? amLODIPine (NORVASC) 10 MG tablet Take 1 tablet (10 mg total) by mouth daily. 90 tablet 3   ??? dandelion root (DANDELION ORAL) Take by mouth. tea     ??? ELDERBERRY FRUIT ORAL Take by mouth. Supplement  Also takes a [place] mushroom extract capsules that  could not find in the database to add to her med list     ???  gabapentin (NEURONTIN) 300 MG capsule Take 1 capsule (300 mg total) by mouth two (2) times a day. 60 capsule 3   ??? imiquimod (ALDARA) 5 % cream APPLY TO AFFECTED AREA THREE TIMES WEEKLY 12 packet 1   ??? levothyroxine (SYNTHROID) 100 MCG tablet TAKE 1 TABLET BY MOUTH EVERY DAY 90 tablet 0   ??? magnesium oxide (MAG-OX) 400 mg (241.3 mg elemental magnesium) tablet Take 1 tablet by mouth Two (2) times a day.     ??? multivitamins, therapeutic with minerals 9 mg iron-400 mcg tablet Take 1 tablet by mouth daily. 90 tablet 0   ??? pantoprazole (PROTONIX) 40 MG tablet Take 1 tablet (40 mg total) by mouth daily. 90 tablet 1   ??? rifAXIMin (XIFAXAN) 550 mg Tab Take 1 tablet (550 mg total) by mouth Two (2) times a day. 60 tablet 11   ??? sertraline (ZOLOFT) 50 MG tablet Take 1 tablet (50 mg total) by mouth daily. 30 tablet 1     No current facility-administered medications for this visit.       Allergies:   Patient has no known allergies.    Past Medical / Surgical History:     Past Medical History:   Diagnosis Date   ??? Disease of thyroid gland    ??? ETOH abuse    ??? Pancreatitis        Past Surgical History:   Procedure Laterality Date   ??? PR UPPER GI ENDOSCOPY,DIAGNOSIS N/A 02/01/2021    Procedure: UGI ENDO, INCLUDE ESOPHAGUS, STOMACH, & DUODENUM &/OR JEJUNUM; DX W/WO COLLECTION SPECIMN, BY BRUSH OR WASH;  Surgeon: Marene Lenz, MD;  [facility];  Service: Gastroenterology   ??? TUBAL LIGATION         Social History     Socioeconomic History   ??? Marital status: Single   Tobacco Use   ??? Smoking status: Every Day     Packs/day: 1.5     Types: Cigarettes   ??? Smokeless tobacco: Current     Types: Chew   ??? Tobacco comments:     1-2ppd. Hopes to cut back   Vaping Use   ??? Vaping Use: Never used   Substance and Sexual Activity   ??? Alcohol use: Yes     Comment: 1/5 a day of liquor   ??? Drug use: No     Social Determinants of Health     Financial Resource Strain: High Risk (12/18/2021)    Overall Financial Resource Strain (CARDIA)    ??? Difficulty of Paying Living Expenses: Hard   Food Insecurity: Food Insecurity Present (12/18/2021)    Hunger Vital Sign    ??? Worried About Running Out of Food in the Last Year: Often true    ??? Ran Out of Food in the Last Year: Often true   Transportation Needs: No Transportation Needs (12/18/2021)    PRAPARE - Transportation    ??? Lack of Transportation (Medical): No    ??? Lack of Transportation (Non-Medical): No       Family History   Problem Relation Age of Onset   ??? Thyroid disease Mother    ??? Heart attack Father    ??? Thyroid disease Sister    ??? Liver disease Brother        For any of the above entries which indicate no records on file, the patient reports no relevant history. Review of Systems:   Review of Systems was completed through a 10 organ system review and  is listed in the chart.  Pertinent positives are noted in HPI or flowsheet and otherwise negative.  Patient has been instructed to followup with PCP or appropriate specialist for symptoms outside the purview of this speciality.    OBJECTIVE:     Vitals:     Temp 37.1 ??C (98.8 ??F) (Temporal)  - Ht 170.2 cm (5' 7)  - Wt 77.4 kg (170 lb 9.6 oz)  - BMI 26.72 kg/m??     The above medications, allergies, history, and ROS, and vitals have been reviewed.    Physical Exam:   GEN: alert and oriented, no apparent distress  HEENT: normocephalic, atraumatic, anicteric, moist mucous membranes  CV: normal heart rate  PULM: normal work of breathing  GI: nondistended  EXT: no swelling, edema in b/l UE and LE  SKIN: no visible ecchymosis or breakdown  PSYCH: normal mood and affect    NEURO:   Manual Muscle Testing    Left Upper Extremity Right Upper Extremity   Elbow Flexion 5/5 5/5   Elbow Extension  5/5 5/5   Wrist Extension  5/5 5/5   Finger Abduction  5/5 5/5         Reflexes     Left Upper Extremity Right Upper Extremity    Triceps 2+ 2+   Biceps 1+ 1+   Brachioradialis 1+ 1+   Hoffman's  negative negative     Sensory  Sensation to light touch WNL throughout b/l UE and LE    MSK:  Gait and Station: normal nonantalgic gait with symmetric body posture    Cervical Spine:   Inspection: Normal alignment. No erythema, discoloration, or asymmetry.  Palpation: L cervical paraspinal tenderness. No vertebral body point tenderness.  ROM: normal flexion, extension, rotation, pain with left lateral bend    Lumbar Spine:  Inspection: Normal alignment. No erythema, discoloration, or asymmetry.  Palpation: No paraspinal muscle tenderness.  No vertebral body point tenderness.  ROM: normal flexion, extension, rotation, lateral bend       Maximino Sarin, MD  [facility]   Physical Medicine and Rehabilitation, [place]        Tia Masker, MD  Assistant Professor - PM&R  Musculoskeletal and Spine Specialist - [facility] [employer]

## 2022-07-24 DIAGNOSIS — N760 Acute vaginitis: Principal | ICD-10-CM

## 2022-07-24 MED ORDER — FLUCONAZOLE 150 MG TABLET
ORAL_TABLET | Freq: Once | ORAL | 0 refills | 2 days | Status: CP
Start: 2022-07-24 — End: 2022-07-24

## 2022-07-25 DIAGNOSIS — F411 Generalized anxiety disorder: Principal | ICD-10-CM

## 2022-07-25 MED ORDER — SERTRALINE 50 MG TABLET
ORAL_TABLET | Freq: Every day | ORAL | 1 refills | 90 days | Status: CP
Start: 2022-07-25 — End: ?

## 2022-07-25 NOTE — Unmapped (Signed)
Patient is requesting the following refill  Requested Prescriptions     Pending Prescriptions Disp Refills    sertraline (ZOLOFT) 50 MG tablet [Pharmacy Med Name: SERTRALINE HCL 50 MG TABLET] 90 tablet 1     Sig: TAKE 1 TABLET BY MOUTH EVERY DAY       Recent Visits  Date Type Provider Dept   07/03/22 Office Visit Deneise Lever, FNP [place] [facility] [address] [facility]   04/03/22 Office Visit Desmond Dike, NP [place] [facility] [address] [facility]   02/20/22 Office Visit Deneise Lever, FNP [place] [facility] [address] [facility]   12/06/21 Office Visit Deneise Lever, FNP [place] [facility]   09/05/21 Office Visit Deneise Lever, FNP [place] [facility]   Showing recent visits within past 365 days with a meds authorizing provider and meeting all other requirements  Future Appointments  Date Type Provider Dept   09/02/22 Appointment Deneise Lever, FNP [place] [facility] [address] [facility]   Showing future appointments within next 365 days with a meds authorizing provider and meeting all other requirements       Labs: PHQ9:   PHQ-9 PHQ-9 TOTAL SCORE   07/03/2022   1:00 PM 24      GAD7:   GAD7 Total Score GAD-7 Total Score   07/03/2022   1:00 PM 21

## 2022-08-06 ENCOUNTER — Ambulatory Visit
Admit: 2022-08-06 | Payer: MEDICAID | Attending: Rehabilitative and Restorative Service Providers" | Primary: Rehabilitative and Restorative Service Providers"
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
## 2022-08-08 MED FILL — XIFAXAN 550 MG TABLET: ORAL | 30 days supply | Qty: 60 | Fill #2

## 2022-08-08 NOTE — Unmapped (Signed)
[facility] Clinical Assessment & Refill Coordination Note    Erin Baxter, DOB: 1976/02/02  Phone: [phone] (home)     All above HIPAA information was verified with patient.     Was a Nurse, learning disability used for this call? No    Specialty Medication(s):   Infectious Disease: Xifaxan     Current Outpatient Medications   Medication Sig Dispense Refill    amLODIPine (NORVASC) 10 MG tablet Take 1 tablet (10 mg total) by mouth daily. 90 tablet 3    dandelion root (DANDELION ORAL) Take by mouth. tea      ELDERBERRY FRUIT ORAL Take by mouth. Supplement  Also takes a [place] mushroom extract capsules that  could not find in the database to add to her med list      gabapentin (NEURONTIN) 300 MG capsule Take 1 capsule (300 mg total) by mouth two (2) times a day. 60 capsule 3    imiquimod (ALDARA) 5 % cream APPLY TO AFFECTED AREA THREE TIMES WEEKLY 12 packet 1    levothyroxine (SYNTHROID) 100 MCG tablet TAKE 1 TABLET BY MOUTH EVERY DAY 90 tablet 0    magnesium oxide (MAG-OX) 400 mg (241.3 mg elemental magnesium) tablet Take 1 tablet by mouth Two (2) times a day.      multivitamins, therapeutic with minerals 9 mg iron-400 mcg tablet Take 1 tablet by mouth daily. 90 tablet 0    pantoprazole (PROTONIX) 40 MG tablet Take 1 tablet (40 mg total) by mouth daily. 90 tablet 1    rifAXIMin (XIFAXAN) 550 mg Tab Take 1 tablet (550 mg total) by mouth Two (2) times a day. 60 tablet 11    sertraline (ZOLOFT) 50 MG tablet TAKE 1 TABLET BY MOUTH EVERY DAY 90 tablet 1     No current facility-administered medications for this visit.        Changes to medications: Erin Baxter reports no changes at this time.    No Known Allergies    Changes to allergies: No    SPECIALTY MEDICATION ADHERENCE     Xifaxan 550 mg: 0 days of medicine on hand       Medication Adherence    Patient reported X missed doses in the last month: 3:  She had extra from not taking the medication when it was first prescribed.  Only missed these 3 doses due to running out of meds.  Specialty Medication: Xifaxan 550mg   Patient is on additional specialty medications: No  Any gaps in refill history greater than 2 weeks in the last 3 months: yes  Demonstrates understanding of importance of adherence: yes  Informant: patient  Provider-estimated medication adherence level: good  Patient is at risk for Non-Adherence: No      Specialty medication(s) dose(s) confirmed: Regimen is correct and unchanged.     Are there any concerns with adherence? No    Adherence counseling provided? Not needed    CLINICAL MANAGEMENT AND INTERVENTION      Clinical Benefit Assessment:    Do you feel the medicine is effective or helping your condition? Yes    Clinical Benefit counseling provided? Not needed    Adverse Effects Assessment:    Are you experiencing any side effects? No    Are you experiencing difficulty administering your medicine? No    Quality of Life Assessment:    How many days over the past month did your hepatic encephalopathy  keep you from your normal activities? For example, brushing your teeth  or getting up in the morning. 0    Have you discussed this with your provider? Not needed    Acute Infection Status:    Acute infections noted within Epic:  No active infections  Patient reported infection: None    Therapy Appropriateness:    Is therapy appropriate and patient progressing towards therapeutic goals? Yes, therapy is appropriate and should be continued    DISEASE/MEDICATION-SPECIFIC INFORMATION      N/A    PATIENT SPECIFIC NEEDS     Does the patient have any physical, cognitive, or cultural barriers? No    Is the patient high risk? No    Does the patient require a Care Management Plan? No     SOCIAL DETERMINANTS OF HEALTH     At the [facility], we have learned that life circumstances - like trouble affording food, housing, utilities, or transportation can affect the health of many of our patients.   That is why we wanted to ask: are you currently experiencing any life circumstances that are negatively impacting your health and/or quality of life? Patient declined to answer    Social Determinants of Health     Financial Resource Strain: High Risk (12/18/2021)    Overall Financial Resource Strain (CARDIA)     Difficulty of Paying Living Expenses: Hard   Internet Connectivity: Not on file   Food Insecurity: Food Insecurity Present (12/18/2021)    Hunger Vital Sign     Worried About Running Out of Food in the Last Year: Often true     Ran Out of Food in the Last Year: Often true   Tobacco Use: High Risk (08/06/2022)    Patient History     Smoking Tobacco Use: Every Day     Smokeless Tobacco Use: Current     Passive Exposure: Not on file   Housing/Utilities: Low Risk  (12/18/2021)    Housing/Utilities     Within the past 12 months, have you ever stayed: outside, in a car, in a tent, in an overnight shelter, or temporarily in someone else's home (i.e. couch-surfing)?: No     Are you worried about losing your housing?: No     Within the past 12 months, have you been unable to get utilities (heat, electricity) when it was really needed?: No   Alcohol Use: Not on file   Transportation Needs: No Transportation Needs (12/18/2021)    PRAPARE - Therapist, art (Medical): No     Lack of Transportation (Non-Medical): No   Substance Use: Not on file   Health Literacy: Not on file   Physical Activity: Not on file   Interpersonal Safety: Not on file   Stress: Not on file   Intimate Partner Violence: Not on file   Depression: Not at risk (04/25/2022)    PHQ-2     PHQ-2 Score: 2   Social Connections: Not on file       Would you be willing to receive help with any of the needs that you have identified today? Not applicable       SHIPPING     Specialty Medication(s) to be Shipped:   Infectious Disease: Xifaxan    Other medication(s) to be shipped: No additional medications requested for fill at this time     Changes to insurance: No    Delivery Scheduled: Yes, Expected medication delivery date: 08/09/22.     Medication will be delivered via UPS to the confirmed prescription address in Epic  WAM.    The patient will receive a drug information handout for each medication shipped and additional FDA Medication Guides as required.  Verified that patient has previously received a Conservation officer, historic buildings and a Surveyor, mining.    The patient or caregiver noted above participated in the development of this care plan and knows that they can request review of or adjustments to the care plan at any time.      All of the patient's questions and concerns have been addressed.    Roderic Palau, PharmD   [facility] Specialty Pharmacist

## 2022-08-20 ENCOUNTER — Ambulatory Visit: Admit: 2022-08-20 | Payer: MEDICAID

## 2022-08-20 NOTE — Unmapped (Unsigned)
Department of Anesthesiology  [facility]  [address]  [place], [place] [address]  [phone]      Date: August 20, 2022  Patient Name: Erin Baxter  MRN: [id number]  PCP: Deneise Lever  Referring Provider: Alta Corning*    Assessment:   Attending: Claudean Siemsen is a 46 y.o. female with a PMHx significant for disease of thyroid gland, ETOH abuse, and pancreatitis. She is [facility] for back pain, unspecified back location, back pain laterality, an chronicity, chronic left shoulder pain, and neck pain.    The patient {ajlortcomplete:54086::completed} the Opioid Risk Tool (ORT) 08/20/22 to help identify risk related to opiate abuse and/or misuse (Health and Behavior Assessment-15 mins). This measure assesses risk factors for opiate abuse and/or misuse in adults with chronic pain, with empirically validated risk factors including: Family history of substance abuse, personal history of substance abuse (alcohol, illicit drugs, prescription drugs), age, history of preadolescent sexual abuse, and psychological comorbidity (depression, other mental health comorbidity). I have supplied this to the patient and have personally assessed and scored it.  -The patient had {ortnumbers:48685} points, for {ortlist:48684}  {ajlort2:45583}     Patient {DOES /DOES VHQ:46962} appear to be utilizing pain medications appropriately and {DOES /DOES XBM:84132} report that the medications do improve patient's quality of life and functionality level.  At today's visit, the patient reports {ajlmedresponse:47259} analgesia from their current medication regimen {with/without:43712} significant adverse effects.    Current Pain Provider: {YES/NO/OTHER:21266}  Urine toxicology: {None:21267}  Urine toxicology screen {loboncurine:46577} appropriate.   Last Opioid Change: {loboncoption:45525}  Last EKG: {ajlna:57045}  Previous Compliance Issues: {None:21267}  Naloxone ordered: {ajlyesnoother:47260}  Total morphine equivalents: ***  Benzodiazepine: {YES/NO:21013}  Pain Psychology: {ajlpainpsych:59727::N/A}  D'Lo DOC: {None:21267}  NCCSRS database {was/was GMW:10272} reviewed 08/20/22.      No diagnosis found.    General Recommendations: The pain condition that the patient suffers from is best treated with a multidisciplinary approach that involves an increase in physical activity to prevent de-conditioning and worsening of the pain cycle, as well as psychological counseling (formal and/or informal) to address the co-morbid psychological affects of pain.  Treatment will often involve judicious use of pain medications and interventional procedures to decrease the pain, allowing the patient to participate in the physical activity that will ultimately produce long-lasting pain reductions.  The goal of the multidisciplinary approach is to return the patient to a higher level of overall function and to restore their ability to perform activities of daily living.      Plan:     -Please note that if you have referred this patient for opioid management, that we will not prescribe any controlled substances until our evaluation is complete, satisfactory, and we have assumed opioid prescribing duties with an opioid agreement. As such, any controlled substances must come from the referring provider if indicated.     ***    Future Considerations:  ***    -No follow-ups on file.    No orders of the defined types were placed in this encounter.    Requested Prescriptions      No prescriptions requested or ordered in this encounter         Subjective:     HPI:  Ms. Erin Baxter is seen in consultation at the request of Deneise Lever, F*  For evaluation and recommendations regarding Her chronic pain.     ***    Pain  Clinic? {ajlpainclinic:50590}    Current Medications:  Gabapentin 300 mg BID          No questionnaires available.                         Her pain started approximately  {NUMBERS:20191} {TIME; DAYS-YEARS:20939} ago.  Her pain involving {dljright:27057} {AMB PAIN 1:25272}.    Her pain is described as {ajlpaindescription:47264}  Erin Baxter is  presenting to the clinic today with pain that is graded as {numbers 0-10:5044}/10 in intensity. Her highest level of pain is reported as {numbers 0-10:5044}/10 in intensity, least pain level is {0 - 10:19007}/10 and average pain level is {numbers 0-10:5044}/10.  Her pain started {painstart:34630}.  Her pain symptoms {Actions; are/are not:16769} associated with {PAIN SYMPTOMS:4371898115}.  The patient {HAS HAS GEX:52841} experienced lost of bowel or bladder control.  Her pain is made worse with {PAIN WORSE LKGM:[id number]}.  Her pain is present {painpresent:34631}   Her pain is improved with {PAIN IMPROVEMENT:[phone]}.     Previous interventions include {ajlprevioustrials:51236}    Previous tests include {ajltests:45132}  Workmans Compensation {HRT options:36140} involved.  This {HRT options:36140} involved with a lawsuit or lawyer  The treatment goals include {ajlgoals:45133}    Previous Medication Trials: {ajlmeds:45134}    Past Medical History:   Diagnosis Date    Disease of thyroid gland     ETOH abuse     Pancreatitis      Past Surgical History:   Procedure Laterality Date    PR UPPER GI ENDOSCOPY,DIAGNOSIS N/A 02/01/2021    Procedure: UGI ENDO, INCLUDE ESOPHAGUS, STOMACH, & DUODENUM &/OR JEJUNUM; DX W/WO COLLECTION SPECIMN, BY BRUSH OR WASH;  Surgeon: Marene Lenz, MD;  [facility];  Service: Gastroenterology    TUBAL LIGATION       Family History   Problem Relation Age of Onset    Thyroid disease Mother     Heart attack Father     Thyroid disease Sister     Liver disease Brother        Social History:  She reports that she has been smoking cigarettes. She has been smoking an average of 1.5 packs per day. Her smokeless tobacco use includes chew. She reports current alcohol use. She reports that she does not use drugs.    The patient is {loboncmarital:42395}  The patient has children: {ajlchildren:43507}  The patient is {loboncliving:42396}  Highest level of education: {loboncschool:42397}  Current Employment: {loboncjob:42399}  Occupation: ***  Exercise: ***  Recreational Drugs: {YES NO [id number]  Treatment for Substance abuse: {YES NO L6539673  Use anothers prescription medications: {YES NO YQIHK:74259}    Allergies as of 08/20/2022    (No Known Allergies)      Current Outpatient Medications   Medication Sig Dispense Refill    amLODIPine (NORVASC) 10 MG tablet Take 1 tablet (10 mg total) by mouth daily. 90 tablet 3    dandelion root (DANDELION ORAL) Take by mouth. tea      ELDERBERRY FRUIT ORAL Take by mouth. Supplement  Also takes a [place] mushroom extract capsules that  could not find in the database to add to her med list      gabapentin (NEURONTIN) 300 MG capsule Take 1 capsule (300 mg total) by mouth two (2) times a day. 60 capsule 3    imiquimod (ALDARA) 5 % cream APPLY TO AFFECTED AREA THREE TIMES WEEKLY 12 packet 1    levothyroxine (SYNTHROID) 100 MCG  tablet TAKE 1 TABLET BY MOUTH EVERY DAY 90 tablet 0    magnesium oxide (MAG-OX) 400 mg (241.3 mg elemental magnesium) tablet Take 1 tablet by mouth Two (2) times a day.      multivitamins, therapeutic with minerals 9 mg iron-400 mcg tablet Take 1 tablet by mouth daily. 90 tablet 0    pantoprazole (PROTONIX) 40 MG tablet Take 1 tablet (40 mg total) by mouth daily. 90 tablet 1    rifAXIMin (XIFAXAN) 550 mg Tab Take 1 tablet (550 mg total) by mouth Two (2) times a day. 60 tablet 11    sertraline (ZOLOFT) 50 MG tablet TAKE 1 TABLET BY MOUTH EVERY DAY 90 tablet 1     No current facility-administered medications for this visit.       Imaging/Tests:   XR Lumbar Spine 12/19/21   FINDINGS:   No fracture seen. Alignment is normal. Mild L2-3 disc space narrowing and endplate spurring. Facet joints appear normal. Sacroiliac joints appear normal. Question atherosclerotic aortic calcifications. Bowel gas pattern normal.     IMPRESSIONS:   Mild L2-3 degenerative disc disease.     XR Thoracic Spine 12/19/21   FINDINGS:   No fracture is seen. Alignment is normal. Disc spaces are normal. Limited evaluation the heart and lungs is normal.     IMPRESSIONS:   Normal radiographs of the thoracic spine.     XR Left Shoulder 10/17/21  IMPRESSIONS:   1. No fracture dislocation left glenohumeral joint. No significant degenerative change.     XR Cervical Spine 10/17/21   IMPRESSIONS:   1. Alignment of the C-spine maintained. No fractures. Mild multilevel facet joint osteoarthritis changes involving the entire C-spine.     Lab Results   Component Value Date    CREATININE 0.72 04/30/2022       Lab Results   Component Value Date    ALKPHOS 125 (H) 04/30/2022    BILITOT 0.3 04/30/2022    BILIDIR <0.10 04/30/2022    PROT 7.1 04/30/2022    ALBUMIN 4.1 04/30/2022    ALT 13 04/30/2022    AST 20 04/30/2022       Lab Results   Component Value Date    PLT 205 04/30/2022         Urine toxiciology screen  Lab Results   Component Value Date    Amphetamines Screen, Ur Negative 10/29/2020    Barbiturates Screen, Ur Negative 10/29/2020    Benzodiazepines Screen, Urine Negative 10/29/2020    Cannabinoids Screen, Ur Negative 10/29/2020    Methadone Screen, Urine Negative 10/29/2020    Opiates Screen, Ur Negative 10/29/2020    Cocaine(Metab.)Screen, Urine Negative 10/29/2020       OPIOID CONFIRMATION:  No results found for: CDIFFTOX, NBUPR, LABCO, HYDROCODONE, HYDROMORPH, MORPHINE, OXYCODONE, OXMU, MAMU, OPIU     BENZODIAZEPINE CONFIRMATION:  No results found for: CDIFFTOX, OHALP, CLONU, HDXYFLRAZUR, 7NHFLU, DESALKYCONF, LORAZURQT, HDXYTRIAZUR, MIDAZURQT, BNZU    Review of Systems:  GENERAL: {nikaROSgen:23990}  HEENT: {NIKAROSENT:24068}  SKIN:{nikaROSskin:23995}  PULMONARY:{nikarospulm:24070}  ENDOCRINE: {nikaROSendoc:23997}  GASTROINTESTINAL:{nikaROSGI:23994:a}  GENITOURINARY:{nikarosgu:24073:a}  MUSCULOSKELETAL:{nikaROSmusculosk:23999:a}  CARDIOVASCULAR:{nikaROSCV:23991:a}  NEUROLOGIC:{nikaROSneur:24000:a}  PSYCHIATRIC: {nikaROSpsych:24001:a}  She {AMB Pain denies/reports:[phone]} any homicidal or suicidal ideation.   HEMATOLOGIC: {nikaroshema:24075:a}  IMMUNOLOGIC: {nikarosimmuno:24076:a}      Objective:     PHYSICAL EXAM:  There were no vitals taken for this visit.  Wt Readings from Last 3 Encounters:   07/18/22 77.4 kg (170 lb 9.6 oz)   07/03/22 77.8 kg (171 lb 9.6 oz)   04/30/22 78.5 kg (173  lb 1.6 oz)     GENERAL: Well developed, well-nourished {loboncobese:42394} and is in {BLANK NWGNFA:21308} distress. The patient is pleasant and interactive. Patient is a good historian.  No evidence of sedation or intoxication.  No overt pain behaviors  HEENT: Normocephalic/atraumatic. Clear sclera.   CARDIOVASCULAR:  {ajlcardiacexam:61906}  RESPIRATORY:  {ajlpulmexam:61907::Normal work of breathing, no supplemental 02}  EXTREMITIES: No clubbing, cyanosis noted.  Joints appear normal.  GASTROINTESTINAL: Soft, nondistended  NEUROLOGIC:  Alert and oriented, speech fluent, normal language. Cranial nerves grossly intact.  Sensation {ajlneuroexam:62877}. Reflexes were {Reflexes:140032} and {were/were not:54765} equal in upper and lower extremities  MUSCULOSKELETAL:  Motor function  {Pain Motor [phone]::5/5} in {ajlupperlower:48905::upper,lower} extremities. Patient rises from a seated position with {ajlambulate3:46823::no} difficulty.  The patient was able to ambulate with {ajlambulate3:46823::no} difficulty throughout the clinic today {With/without:5700} the assistance of a walking aid-{ajlcane:46931::None}.     SPINE:The patient {DOES /DOES MVH:84696} have pain with palpation of the {Cervical Thoracic Lumbar:47568} spine.  {AMB Pain Normal/Abnormal:[phone]::Normal} flexion and {AMB Pain Normal/Abnormal:[phone]::Normal} extension at the waist. {AMB Pain Positive/Negative:[phone]} pain on facet loading procedures.Surgical scaring {HRT options:36140} noted on spine. Patrick's maneuver {AMB Pain Positive/Negative:[phone]}{Right/left:16020}. Straight leg raising {AMB Pain Positive/Negative:586-141-9430} {Right/left:16020}.   SKIN: No obvious rashes lesions or erythema on the exposed skin  PSYCHIATRIC:Appropriate, full range affect, no psychomotor retardation

## 2022-09-02 ENCOUNTER — Ambulatory Visit: Admit: 2022-09-02 | Discharge: 2022-09-03 | Payer: MEDICAID

## 2022-09-02 DIAGNOSIS — R188 Other ascites: Principal | ICD-10-CM

## 2022-09-02 DIAGNOSIS — F419 Anxiety disorder, unspecified: Principal | ICD-10-CM

## 2022-09-02 DIAGNOSIS — K7460 Unspecified cirrhosis of liver: Principal | ICD-10-CM

## 2022-09-02 DIAGNOSIS — I10 Essential (primary) hypertension: Principal | ICD-10-CM

## 2022-09-02 DIAGNOSIS — F32A Anxiety and depression: Principal | ICD-10-CM

## 2022-09-02 DIAGNOSIS — M25512 Pain in left shoulder: Principal | ICD-10-CM

## 2022-09-02 DIAGNOSIS — G8929 Other chronic pain: Principal | ICD-10-CM

## 2022-09-02 DIAGNOSIS — M542 Cervicalgia: Principal | ICD-10-CM

## 2022-09-02 DIAGNOSIS — F17200 Nicotine dependence, unspecified, uncomplicated: Principal | ICD-10-CM

## 2022-09-02 DIAGNOSIS — R5383 Other fatigue: Principal | ICD-10-CM

## 2022-09-02 DIAGNOSIS — Z1211 Encounter for screening for malignant neoplasm of colon: Principal | ICD-10-CM

## 2022-09-02 DIAGNOSIS — M5441 Lumbago with sciatica, right side: Principal | ICD-10-CM

## 2022-09-02 DIAGNOSIS — R6882 Decreased libido: Principal | ICD-10-CM

## 2022-09-02 DIAGNOSIS — K219 Gastro-esophageal reflux disease without esophagitis: Principal | ICD-10-CM

## 2022-09-02 DIAGNOSIS — E039 Hypothyroidism, unspecified: Principal | ICD-10-CM

## 2022-09-02 DIAGNOSIS — Z1231 Encounter for screening mammogram for malignant neoplasm of breast: Principal | ICD-10-CM

## 2022-09-02 LAB — HEMOGLOBIN A1C
ESTIMATED AVERAGE GLUCOSE: 120 mg/dL
HEMOGLOBIN A1C: 5.8 % — ABNORMAL HIGH (ref 4.8–5.6)

## 2022-09-02 LAB — PROGESTERONE: PROGESTERONE LEVEL: <0.2 ng/mL

## 2022-09-02 LAB — TSH: THYROID STIMULATING HORMONE: 0.655 u[IU]/mL (ref 0.550–4.780)

## 2022-09-02 LAB — LIPID PANEL
CHOLESTEROL/HDL RATIO SCREEN: 6.2 — ABNORMAL HIGH (ref 1.0–4.5)
CHOLESTEROL: 199 mg/dL (ref <=200)
HDL CHOLESTEROL: 32 mg/dL — ABNORMAL LOW (ref 40–60)
LDL CHOLESTEROL CALCULATED: 132 mg/dL — ABNORMAL HIGH (ref 40–99)
NON-HDL CHOLESTEROL: 167 mg/dL — ABNORMAL HIGH (ref 70–130)
TRIGLYCERIDES: 176 mg/dL — ABNORMAL HIGH (ref 0–150)
VLDL CHOLESTEROL CAL: 35.2 mg/dL (ref 9–37)

## 2022-09-02 LAB — ESTRADIOL(ESTROGEN) LEVEL: ESTRADIOL LEVEL: <11.8 pg/mL

## 2022-09-02 LAB — FOLLICLE STIMULATING HORMONE: FOLLICLE STIMULATING HORMONE: 123.6 m[IU]/mL

## 2022-09-02 LAB — LUTEINIZING HORMONE: LUTEINIZING HORMONE: 62.7 m[IU]/mL

## 2022-09-02 NOTE — Unmapped (Signed)
Assessment and Plan:     Erin Baxter was seen today for follow-up.    Diagnoses and all orders for this visit:    Other chronic pain  Chronic right-sided low back pain with right-sided sciatica  Chronic left shoulder pain  Neck pain          -     Patient feels progress with mobility and in physical therapy inhibited by chronic pain        -    [facility] [place]        -    Patient feels the severity and chronicity of pain impacting mental health        -    Adherent with 300 mg gabapentin twice daily, needs to schedule MRI.        -    Patient proceeding with disability claim.        -    Neck x-ray (12/22):Alignment of the C-spine maintained. No fractures. Mild multilevel facet joint osteoarthritis changes involving the entire C-spine.         -    Thoracic x-ray (2/23): Normal radiographs of the thoracic spine.         -     Lumbar x-ray: (2/23): Mild L2-3 degenerative disc disease.         -     Left shoulder x-ray (12/22): No fracture dislocation left glenohumeral joint. No significant degenerative change.   -     Ambulatory referral to Pain Clinic; Future    Screening for colon cancer          -     Given phone # to schedule  -     Colonoscopy; Future    Screening mammogram for breast cancer          -      Given phone # to schedule  -     Mammography screening bilateral; Future    Anxiety and depression          -    Adherent with 50 mg sertraline        -    Did not feel therapy with virtual LCSW team effective, stopped sessions        -    Self-care: Exercise, healthy diet, stress management, sleep hygiene, social connections.   -     Vitamin D 25 Hydroxy (25OH D2 + D3)    Hypothyroidism, unspecified type          -    Adherent with 100 mcg levothyroxine, agreeable for repeat level today.        -    TSH: 0.508 (10/22)  -     TSH    Fatigue, unspecified type  Low libido            -     Labs: A1c, estradiol, progesterone, FSH, LH    Tobacco use disorder          -     Patient decreased to 1 pack of cigarettes per day, down from 2 packs/day        -     Smoking cessation advised discussed with patient.    Essential hypertension           -    Adherent with 10 mg amlodipine         -    Cr 0.72, K 4.2 (06/23)    Cirrhosis  of liver with ascites, unspecified hepatic cirrhosis type (CMS-HCC)           -   Managed by [facility], taking rifaximin 550 mg BID         -   Last visit 06/23, MELD-NA is low at 7 so liver transplant evaluation is not needed at this time.          -   Upcoming visit 12/23    Gastroesophageal reflux disease, unspecified whether esophagitis present           -   Adherent with PPI    Barriers to recommended plan: None identified    Return if symptoms worsen or fail to improve, for 3-4 months .      Subjective:     HPI: Erin Baxter is a 46 y.o. female here for Follow-up (Would like to have pain medication to help with back and neck pain. ).    Follow-up:    Chronic pain:  Cannot move neck, hard time lifting arms above shoulders  Left arm numbness, difficult movement in fingers, left shoulders feel raw/bruised  'cannot take it' (pain)  Would like to be referred to [facility]  States in network for insurance, would be willing to work with her given liver disease  Needs referral from PCP to proceed with appointment.  Hears neck grinding, neck is catching, sleeping with brace on neck  Patient states performing ADLs, gentle movements in home, feels pain is inhibiting mobility and ADLs  Neck x-ray (12/22):Alignment of the C-spine maintained. No fractures. Mild multilevel facet joint osteoarthritis changes involving the entire C-spine.   Thoracic x-ray (2/23): Normal radiographs of the thoracic spine.   Lumbar x-ray: (2/23): Mild L2-3 degenerative disc disease.   Patient states has attempted physical therapy, pain has prohibited progress in physical therapy  Needs to schedule MRI, taking 300 mg gabapentin twice daily    Tobacco use:  Down to 1 pack per day, feels 'doing pretty good', thinks will stop/decrease more when weather gets colder    Hypothyroid:  Adherent with 100 mcg levothyroxine, needs levels drawn today  TSH: 0.508 (10/22)    HTN: Adherent with 10 mg amlodipine, Cr 0.72, K 4.2 (06/23)    Anxiety/depression:  Adherent with 50 mg sertraline, trying to stay active at home, get out--activities w/son and daughter  Pt feels if pain were better controlled, her mental health would be better  Would like hormone levels checked today--low libido, fatigue    Cirrhosis of liver: Followed in regular intervals by liver clinic specialists, has stopped ETOH  Last visit: 06/23, next visit 12/23, taking rifaximin 550 mg BID  MELD-NA is low at 7 so liver transplant evaluation is not needed at this time.     Prevention Declined flu shot, agreeable for colonoscopy, mammogram     I have reviewed past medical, surgical, medications, allergies, social and family histories today and updated them in Epic where appropriate.    ROS:   PHQ-9 PHQ-9 TOTAL SCORE   09/02/2022   2:00 PM 24   07/03/2022   1:00 PM 24   05/07/2022  10:00 AM 24   02/20/2022   1:00 PM 20   12/18/2021   8:00 AM 23      GAD7 Total Score GAD-7 Total Score   09/02/2022   2:00 PM 19   07/03/2022   1:00 PM 21   05/07/2022  10:00 AM 21   02/20/2022   1:00 PM 15  12/18/2021   8:00 AM 21        Review of systems negative unless otherwise noted as per HPI.      Objective:     Vitals:    09/02/22 1345   BP: 122/70   Pulse: 95   Temp: 37.1 ??C (98.7 ??F)   SpO2: 96%     Body mass index is 27.06 kg/m??.    Physical Exam  Vitals and nursing note reviewed.   Constitutional:       Appearance: Normal appearance.   HENT:      Head: Normocephalic and atraumatic.   Neck:      Comments: Limited lateral movement due to pain  Cardiovascular:      Rate and Rhythm: Normal rate and regular rhythm.      Pulses: Normal pulses.      Heart sounds: Normal heart sounds.   Pulmonary:      Effort: Pulmonary effort is normal.      Breath sounds: Normal breath sounds.   Musculoskeletal:      Cervical back: Neck supple.      Comments: Ambulating without difficult, has one point cane in exam room, not utilized  Limited LUE movement due to pain.   Skin:     General: Skin is warm and dry.      Capillary Refill: Capillary refill takes less than 2 seconds.   Neurological:      General: No focal deficit present.      Mental Status: She is alert and oriented to person, place, and time. Mental status is at baseline.   Psychiatric:         Thought Content: Thought content normal.      Comments: Tearful at times, appropriate with examiner            Medication adherence and barriers to the treatment plan have been addressed. Opportunities to optimize healthy behaviors have been discussed. Patient / caregiver voiced understanding.   I personally spent 30 minutes face-to-face and non-face-to-face in the care of this patient, which includes all pre, intra, and post visit time on the date of service.  Cleon Dew, DNP, FNP-C  [facility]  [phone] (F)    Note - This record has been created using [employer]. Chart creation errors have been sought, but may not always have been located. Such creation errors do not reflect on the standard of medical care.

## 2022-09-02 NOTE — Unmapped (Signed)
Please call: [phone] to schedule colonoscopy.  Thank you.     Please call [phone] to schedule your imaging study.    Mammogram: please call [phone].    [facility]  [address]  [place], [place] [address]      [facility] [place] [facility]  [address]  [place], [place] [address]

## 2022-09-04 NOTE — Unmapped (Signed)
Dr. Riccardo Dubin patient, patient reports they have finished PT, but are still in a lot of pain. Patient reports at last visit, they were told to notify if PT was unsuccessful that an MRI would be performed. Patient would like to have the MRI and would like to have a callback.

## 2022-09-06 DIAGNOSIS — M542 Cervicalgia: Principal | ICD-10-CM

## 2022-09-09 ENCOUNTER — Ambulatory Visit: Admit: 2022-09-09 | Discharge: 2022-09-10 | Payer: MEDICAID

## 2022-09-10 LAB — VITAMIN D 25 HYDROXY: VITAMIN D, TOTAL (25OH): 38.1 ng/mL (ref 20.0–80.0)

## 2022-09-10 NOTE — Unmapped (Signed)
[facility] Refill Coordination Note    Specialty Medication(s) to be Shipped:   Infectious Disease: Xifaxan    Other medication(s) to be shipped: No additional medications requested for fill at this time     Erin Baxter, DOB: February 16, 1976  Phone: [phone] (home)       All above HIPAA information was verified with patient.     Was a Nurse, learning disability used for this call? No    Completed refill call assessment today to schedule patient's medication shipment from the [facility] [phone]).  All relevant notes have been reviewed.     Specialty medication(s) and dose(s) confirmed: Regimen is correct and unchanged.   Changes to medications: Erin Baxter reports no changes at this time.  Changes to insurance: No  New side effects reported not previously addressed with a pharmacist or physician: None reported  Questions for the pharmacist: No    Confirmed patient received a Conservation officer, historic buildings and a Surveyor, mining with first shipment. The patient will receive a drug information handout for each medication shipped and additional FDA Medication Guides as required.       DISEASE/MEDICATION-SPECIFIC INFORMATION        N/A    SPECIALTY MEDICATION ADHERENCE     Medication Adherence    Patient reported X missed doses in the last month: 0  Specialty Medication: xifaxan 550mg   Patient is on additional specialty medications: No                                Were doses missed due to medication being on hold? No    Xifaxan 550 mg: 4 days of medicine on hand        REFERRAL TO PHARMACIST     Referral to the pharmacist: Not needed      [facility]     Shipping address confirmed in Epic.     Delivery Scheduled: Yes, Expected medication delivery date: 09/13/22.     Medication will be delivered via UPS to the prescription address in Epic WAM.    Unk Lightning   [facility] Specialty Technician

## 2022-09-12 MED FILL — XIFAXAN 550 MG TABLET: ORAL | 30 days supply | Qty: 60 | Fill #3

## 2022-09-13 NOTE — Unmapped (Signed)
Results Seen by patient Erin Baxter on 09/12/2022 12:00 PM

## 2022-09-21 DIAGNOSIS — E039 Hypothyroidism, unspecified: Principal | ICD-10-CM

## 2022-09-21 DIAGNOSIS — K219 Gastro-esophageal reflux disease without esophagitis: Principal | ICD-10-CM

## 2022-09-21 MED ORDER — PANTOPRAZOLE 40 MG TABLET,DELAYED RELEASE
ORAL_TABLET | Freq: Every day | ORAL | 1 refills | 0 days
Start: 2022-09-21 — End: ?

## 2022-09-21 MED ORDER — LEVOTHYROXINE 100 MCG TABLET
ORAL_TABLET | 0 refills | 0 days
Start: 2022-09-21 — End: ?

## 2022-09-23 MED ORDER — PANTOPRAZOLE 40 MG TABLET,DELAYED RELEASE
ORAL_TABLET | Freq: Every day | ORAL | 2 refills | 90 days | Status: CP
Start: 2022-09-23 — End: ?

## 2022-09-23 MED ORDER — LEVOTHYROXINE 100 MCG TABLET
ORAL_TABLET | 3 refills | 0 days | Status: CP
Start: 2022-09-23 — End: ?

## 2022-09-23 NOTE — Unmapped (Signed)
Patient is requesting the following refill  Requested Prescriptions     Pending Prescriptions Disp Refills    levothyroxine (SYNTHROID) 100 MCG tablet [Pharmacy Med Name: LEVOTHYROXINE 100 MCG TABLET] 90 tablet 0     Sig: TAKE 1 TABLET BY MOUTH EVERY DAY    pantoprazole (PROTONIX) 40 MG tablet [Pharmacy Med Name: PANTOPRAZOLE SOD DR 40 MG TAB] 90 tablet 1     Sig: TAKE 1 TABLET BY MOUTH EVERY DAY       Recent Visits  Date Type Provider Dept   09/02/22 Office Visit Deneise Lever, FNP [place] [facility] [address] [facility]   07/03/22 Office Visit Johnn Hai, Loleta Rose, FNP [place] [facility] [address] [facility]   04/03/22 Office Visit Farrug, Emogene Morgan, NP [place] [facility] [address] [facility]   02/20/22 Office Visit Johnn Hai, Loleta Rose, FNP [place] [facility] [address] [facility]   12/06/21 Office Visit Deneise Lever, FNP [place] [facility]   Showing recent visits within past 365 days with a meds authorizing provider and meeting all other requirements  Future Appointments  Date Type Provider Dept   12/03/22 Appointment Deneise Lever, FNP [place] [facility] [address] [facility]   Showing future appointments within next 365 days with a meds authorizing provider and meeting all other requirements

## 2022-09-27 ENCOUNTER — Ambulatory Visit: Admit: 2022-09-27 | Discharge: 2022-09-28 | Payer: MEDICAID

## 2022-10-02 NOTE — Unmapped (Signed)
[facility] Refill Coordination Note    Specialty Medication(s) to be Shipped:   Infectious Disease: Xifaxan    Other medication(s) to be shipped: No additional medications requested for fill at this time     Erin Baxter, DOB: 11-12-1975  Phone: [phone] (home)       All above HIPAA information was verified with patient.     Was a Nurse, learning disability used for this call? No    Completed refill call assessment today to schedule patient's medication shipment from the [facility] [phone]).  All relevant notes have been reviewed.     Specialty medication(s) and dose(s) confirmed: Regimen is correct and unchanged.   Changes to medications: Erin Baxter reports no changes at this time.  Changes to insurance: No  New side effects reported not previously addressed with a pharmacist or physician: None reported  Questions for the pharmacist: No    Confirmed patient received a Conservation officer, historic buildings and a Surveyor, mining with first shipment. The patient will receive a drug information handout for each medication shipped and additional FDA Medication Guides as required.       DISEASE/MEDICATION-SPECIFIC INFORMATION        N/A    SPECIALTY MEDICATION ADHERENCE     Medication Adherence    Patient reported X missed doses in the last month: 0  Specialty Medication: XIFAXAN 550 mg  Patient is on additional specialty medications: No                                Were doses missed due to medication being on hold? No    Xifaxan 550 mg: 10 days of medicine on hand        REFERRAL TO PHARMACIST     Referral to the pharmacist: Not needed      [facility]     Shipping address confirmed in Epic.     Delivery Scheduled: Yes, Expected medication delivery date: 10/10/22.     Medication will be delivered via UPS to the prescription address in Epic WAM.    Unk Lightning   [facility] Specialty Technician

## 2022-10-09 MED FILL — XIFAXAN 550 MG TABLET: ORAL | 30 days supply | Qty: 60 | Fill #4

## 2022-10-10 NOTE — Unmapped (Signed)
MRI available to view.

## 2022-10-17 DIAGNOSIS — M479 Spondylosis, unspecified: Principal | ICD-10-CM

## 2022-10-29 ENCOUNTER — Ambulatory Visit: Admit: 2022-10-29 | Discharge: 2022-10-30 | Payer: MEDICAID

## 2022-10-29 DIAGNOSIS — F17200 Nicotine dependence, unspecified, uncomplicated: Principal | ICD-10-CM

## 2022-10-29 DIAGNOSIS — F1091 Alcohol use disorder in remission: Principal | ICD-10-CM

## 2022-10-29 DIAGNOSIS — K7469 Other cirrhosis of liver: Principal | ICD-10-CM

## 2022-10-29 LAB — CBC
HEMATOCRIT: 40 % (ref 34.0–44.0)
HEMOGLOBIN: 13.8 g/dL (ref 11.3–14.9)
MEAN CORPUSCULAR HEMOGLOBIN CONC: 34.5 g/dL (ref 32.0–36.0)
MEAN CORPUSCULAR HEMOGLOBIN: 31.6 pg (ref 25.9–32.4)
MEAN CORPUSCULAR VOLUME: 91.5 fL (ref 77.6–95.7)
MEAN PLATELET VOLUME: 9.7 fL (ref 6.8–10.7)
PLATELET COUNT: 217 10*9/L (ref 150–450)
RED BLOOD CELL COUNT: 4.38 10*12/L (ref 3.95–5.13)
RED CELL DISTRIBUTION WIDTH: 14.7 % (ref 12.2–15.2)
WBC ADJUSTED: 9.3 10*9/L (ref 3.6–11.2)

## 2022-10-29 LAB — COMPREHENSIVE METABOLIC PANEL
ALBUMIN: 4.3 g/dL (ref 3.4–5.0)
ALKALINE PHOSPHATASE: 107 U/L (ref 46–116)
ALT (SGPT): 13 U/L (ref 10–49)
ANION GAP: 7 mmol/L (ref 5–14)
AST (SGOT): 21 U/L (ref <=34)
BILIRUBIN TOTAL: 0.2 mg/dL — ABNORMAL LOW (ref 0.3–1.2)
BLOOD UREA NITROGEN: 15 mg/dL (ref 9–23)
BUN / CREAT RATIO: 20
CALCIUM: 9.8 mg/dL (ref 8.7–10.4)
CHLORIDE: 107 mmol/L (ref 98–107)
CO2: 25 mmol/L (ref 20.0–31.0)
CREATININE: 0.74 mg/dL
EGFR CKD-EPI (2021) FEMALE: >90 mL/min/{1.73_m2} (ref >=60)
GLUCOSE RANDOM: 83 mg/dL (ref 70–99)
POTASSIUM: 4.4 mmol/L (ref 3.5–5.1)
PROTEIN TOTAL: 7.3 g/dL (ref 5.7–8.2)
SODIUM: 139 mmol/L (ref 135–145)

## 2022-10-29 LAB — AFP TUMOR MARKER: AFP-TUMOR MARKER: 8 ng/mL (ref <=8)

## 2022-10-29 LAB — PROTIME-INR
INR: 1.02
PROTIME: 11.4 s (ref 9.9–12.6)

## 2022-10-29 NOTE — Unmapped (Signed)
[facility]  10/29/2022    Reason for visit: Follow up for Other cirrhosis of liver (CMS-HCC) [K74.69]    Assessment/Plan:    46 y.o. 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 MELD currently 8.    Other cirrhosis of liver (CMS-HCC)  Stop rifaximin  HCC surveillance: Ultrasound ordered for Feb 2024  Previously declined vaccines; did not discuss today    Alcohol use disorder in remission  Commended on her sobriety and encouraged her to continue to avoid alcohol    Tobacco use disorder  Encouraged smoking cessation    Return in about 6 months (around 04/30/2023) for a visit with Lincoln Maxin, PA-C.    Subjective   History of Present Illness   Accompanied by: N/A (unaccompanied)    46 y.o. female with GAD, MDD, chronic pain, alcohol use disorder in remission, and alcohol-associated cirrhosis exacerbated by portal hypertension, ascites, and hepatic encephalopathy. History of hospitalizations for intoxication, withdrawal, and withdrawal seizures. Diagnosed with cirrhosis during hospitalization in December 2021. No history of GI bleeding or paracentesis.     Interval history:   Last visit on 04/30/2022. No interval hospitalizations or changes in her health. Sober from alcohol 1 year and 10 months and she feels good about it. Trying to quit smoking. Tried stopping rifaximin x 2 weeks and had no problems with confusion. She wonders if she still needs to take it.     Objective   Physical Exam   Vital Signs: BP 114/68 (BP Site: L Arm, BP Position: Sitting, BP Cuff Size: Medium)  - Pulse 85  - Temp 36.9 ??C (98.4 ??F) (Temporal)  - Ht 170.2 cm (5' 7)  - Wt 79.9 kg (176 lb 3.2 oz)  - SpO2 95%  - BMI 27.60 kg/m??   Constitutional: She is in no apparent distress  Eyes: Anicteric sclerae  Cardiovascular: No peripheral edema  Gastrointestinal: Soft, nontender abdomen without hepatosplenomegaly, hernias, or masses  Neurologic: Awake, alert, and oriented to person, place, and time with normal speech and no asterixis    Results for orders placed or performed in visit on 10/29/22   PT-INR   Result Value Ref Range    PT 11.4 9.9 - 12.6 sec    INR 1.02    Comprehensive Metabolic Panel   Result Value Ref Range    Sodium 139 135 - 145 mmol/L    Potassium 4.4 3.5 - 5.1 mmol/L    Chloride 107 98 - 107 mmol/L    CO2 25.0 20.0 - 31.0 mmol/L    Anion Gap 7 5 - 14 mmol/L    BUN 15 9 - 23 mg/dL    Creatinine 0.98 1.19 - 1.02 mg/dL    BUN/Creatinine Ratio 20     eGFR CKD-EPI (2021) Female >90 >=60 mL/min/1.83m2    Glucose 83 70 - 99 mg/dL    Calcium 9.8 8.7 - 14.7 mg/dL    Albumin 4.3 3.4 - 5.0 g/dL    Total Protein 7.3 5.7 - 8.2 g/dL    Total Bilirubin 0.2 (L) 0.3 - 1.2 mg/dL    AST 21 <=82 U/L    ALT 13 10 - 49 U/L    Alkaline Phosphatase 107 46 - 116 U/L   CBC   Result Value Ref Range    WBC 9.3 3.6 - 11.2 10*9/L    RBC 4.38 3.95 - 5.13 10*12/L  HGB 13.8 11.3 - 14.9 g/dL    HCT 16.1 09.6 - 04.5 %    MCV 91.5 77.6 - 95.7 fL    MCH 31.6 25.9 - 32.4 pg    MCHC 34.5 32.0 - 36.0 g/dL    RDW 40.9 81.1 - 91.4 %    MPV 9.7 6.8 - 10.7 fL    Platelet 217 150 - 450 10*9/L   AFP tumor marker   Result Value Ref Range    AFP-Tumor Marker 8 <=8 ng/mL     MELD 3.0: 8 at 10/29/2022  3:16 PM  Calculated from:  Serum Creatinine: 0.74 mg/dL (Using min of 1 mg/dL) at 78/29/5621  3:08 PM  Serum Sodium: 139 mmol/L (Using max of 137 mmol/L) at 10/29/2022  3:16 PM  Total Bilirubin: 0.2 mg/dL (Using min of 1 mg/dL) at 65/78/4696  2:95 PM  Serum Albumin: 4.3 g/dL (Using max of 3.5 g/dL) at 28/41/3244  0:10 PM  INR(ratio): 1.02 at 10/29/2022  3:16 PM  Age at listing (hypothetical): 46 years  Sex: Female at 10/29/2022  3:16 PM    EGD 02/01/2021: No varices    Fibroscan 05/08/2022: 14.7 kPa, 226 dB/m    Ultrasound 06/13/2022:  Heterogenous and mildly nodular liver, likely representing chronic liver disease.  No focal liver lesion identified.   LI-RADS ultrasound category: [place] 1- negative.   Visualization score: A (minimal limitations)       I personally spent 30 minutes face-to-face and non-face-to-face in the care of this patient, which includes all pre, intra, and post visit time on the date of service.

## 2022-10-29 NOTE — Unmapped (Signed)
[facility]  [facility]  Marquita Palms, MD   [facility]  [address]  [place], [place] [address]  Main Clinic: [phone]  Appointment Schedulers: [phone]  Theophilus Kinds, RN: [phone]  Fax: [phone]       Thank you for allowing me to participate in your medical care today. Here are my recommendations based on today's visit:    Congratulations on your sobriety!  Call [phone] option 1 to schedule your ultrasound at [facility] for early February 2024  Get an ultrasound of your liver every 6 months to look for early signs of liver cancer  Work on quitting smoking    Take care,  Marquita Palms, MD

## 2022-11-06 NOTE — Unmapped (Signed)
[facility] Refill Coordination Note    Specialty Medication(s) to be Shipped:   Infectious Disease: Xifaxan    Other medication(s) to be shipped: No additional medications requested for fill at this time     Erin Baxter, DOB: 1976-10-15  Phone: [phone] (home)       All above HIPAA information was verified with patient.     Was a Nurse, learning disability used for this call? No    Completed refill call assessment today to schedule patient's medication shipment from the [facility] [phone]).  All relevant notes have been reviewed.     Specialty medication(s) and dose(s) confirmed: Regimen is correct and unchanged.   Changes to medications: [name] reports no changes at this time.  Changes to insurance: No  New side effects reported not previously addressed with a pharmacist or physician: None reported  Questions for the pharmacist: No    Confirmed patient received a Conservation officer, historic buildings and a Surveyor, mining with first shipment. The patient will receive a drug information handout for each medication shipped and additional FDA Medication Guides as required.       DISEASE/MEDICATION-SPECIFIC INFORMATION        N/A    SPECIALTY MEDICATION ADHERENCE     Medication Adherence    Patient reported X missed doses in the last month: 0  Specialty Medication: xifaxan 550mg   Patient is on additional specialty medications: No  Patient is on more than two specialty medications: No                                Were doses missed due to medication being on hold? No    Xifaxan 550 mg tablet : Over 7 days of medicine on hand     REFERRAL TO PHARMACIST     Referral to the pharmacist: Not needed      [facility]     Shipping address confirmed in Epic.     Delivery Scheduled: Yes, Expected medication delivery date: 11/15/21.     Medication will be delivered via UPS to the prescription address in Epic WAM.    Tobi Bastos, PharmD   [facility] Specialty Pharmacist

## 2022-11-09 DIAGNOSIS — A630 Anogenital (venereal) warts: Principal | ICD-10-CM

## 2022-11-09 MED ORDER — IMIQUIMOD 5 % TOPICAL CREAM PACKET
PACK | 1 refills | 0 days
Start: 2022-11-09 — End: ?

## 2022-11-12 MED ORDER — IMIQUIMOD 5 % TOPICAL CREAM PACKET
PACK | 1 refills | 0 days | Status: CP
Start: 2022-11-12 — End: ?

## 2022-11-12 NOTE — Unmapped (Signed)
Patient is requesting the following refill  Requested Prescriptions     Pending Prescriptions Disp Refills    imiquimod (ALDARA) 5 % cream [Pharmacy Med Name: IMIQUIMOD 5% CREAM PACKET] 12 packet 1     Sig: APPLY TO AFFECTED AREA THREE TIMES WEEKLY       Recent Visits  Date Type Provider Dept   09/02/22 Office Visit Deneise Lever, FNP [place] [facility] [address] [facility]   07/03/22 Office Visit Johnn Hai, Loleta Rose, FNP [place] [facility] [address] [facility]   04/03/22 Office Visit Farrug, Emogene Morgan, NP [place] [facility] [address] At [facility]   02/20/22 Office Visit Deneise Lever, FNP [place] [facility] [address] At [facility]   12/06/21 Office Visit Deneise Lever, FNP [place] [facility]   Showing recent visits within past 365 days with a meds authorizing provider and meeting all other requirements  Future Appointments  Date Type Provider Dept   12/03/22 Appointment Deneise Lever, FNP [place] [facility] [address] [facility]   Showing future appointments within next 365 days with a meds authorizing provider and meeting all other requirements

## 2022-11-14 MED FILL — XIFAXAN 550 MG TABLET: ORAL | 90 days supply | Qty: 180 | Fill #5

## 2023-02-08 DIAGNOSIS — K7460 Unspecified cirrhosis of liver: Principal | ICD-10-CM

## 2023-02-08 DIAGNOSIS — R188 Other ascites: Principal | ICD-10-CM

## 2023-02-08 MED ORDER — RIFAXIMIN 550 MG TABLET
ORAL_TABLET | Freq: Two times a day (BID) | ORAL | 11 refills | 30 days
Start: 2023-02-08 — End: 2024-02-08

## 2023-02-10 MED ORDER — RIFAXIMIN 550 MG TABLET
ORAL_TABLET | Freq: Two times a day (BID) | ORAL | 11 refills | 30 days
Start: 2023-02-10 — End: 2024-02-10

## 2023-02-10 NOTE — Unmapped (Signed)
The patient is requesting a medication refill

## 2023-02-12 DIAGNOSIS — F411 Generalized anxiety disorder: Principal | ICD-10-CM

## 2023-02-12 MED ORDER — SERTRALINE 50 MG TABLET
ORAL_TABLET | Freq: Every day | ORAL | 1 refills | 90 days
Start: 2023-02-12 — End: ?

## 2023-02-12 NOTE — Unmapped (Signed)
Patient is requesting the following refill  Requested Prescriptions     Pending Prescriptions Disp Refills    sertraline (ZOLOFT) 50 MG tablet [Pharmacy Med Name: SERTRALINE HCL 50 MG TABLET] 90 tablet 1     Sig: TAKE 1 TABLET BY MOUTH EVERY DAY       Recent Visits  Date Type Provider Dept   09/02/22 Office Visit Deneise Lever, FNP [place] [facility] [address] [facility]   07/03/22 Office Visit Johnn Hai, Loleta Rose, FNP [place] [facility] [address] At [facility]   04/03/22 Office Visit Farrug, Emogene Morgan, NP [place] [facility] [address] At [facility]   02/20/22 Office Visit Johnn Hai, Loleta Rose, FNP [place] [facility] [address] [facility]   Showing recent visits within past 365 days with a meds authorizing provider and meeting all other requirements  Future Appointments  No visits were found meeting these conditions.  Showing future appointments within next 365 days with a meds authorizing provider and meeting all other requirements

## 2023-02-19 NOTE — Unmapped (Signed)
Intergrated Pain Correspondence

## 2023-03-01 NOTE — Unmapped (Unsigned)
[facility]  Physical Medicine and Rehabilitation     Patient Name:Erin Baxter  MRN: [id number]  DOB: 1976/01/20  Age: 47 y.o.     ----------------------------------------------------------------------------------------------------------------------  March 01, 2023 2:16 PM. Documentation assistance provided by Raford Pitcher, medical scribe, at the direction of Inocencio Homes, MD.  ----------------------------------------------------------------------------------------------------------------------   ASSESSMENT & PLAN:     03/01/23     DIAGNOSIS:   Cervical spondylosis - suspect facet mediated pain with overlying myofascial pain exacerbated by poor posture but correctable with therapies.    Chronic low back pain with right lumbar radiculitis without red flags and mild spondylosis on Xray.    TREATMENT PLAN:   Physical Therapy to work on neck strength and posture. Scheduled for first session on 08/06/22.    Consider Cervical/Lumbar MRI and injection if symptoms worsen or fail to improve after 6 weeks of Physical Therapy     Meds per PCP; recommend discontinuing BC powder    NEXT STEPS/FOLLOW UP: 3 months    SUBJECTIVE:     Chief Complaint:    No chief complaint on file.  03/01/23  Pt presents in follow up after beginning and finishing PT for neck strength. Pt was still in pain following PT so she obtained a new MRI. Today, ***    MRI Cervical Spine Wo Contrast (09/27/22)  Impression:  Disc osteophyte complex at C4-C5 with uncovertebral and facet arthrosis contributing to mild spinal canal stenosis and moderate to severe left neural foraminal stenosis.  Disc osteophyte complex at C5-C6 causing minimal left neural foraminal narrowing.  No other significant disc osteophyte complex at any level.    History of Present Illness:   Erin Baxter is a 47 y.o. year old female being evaluated in consultation at the request of Deneise Lever, FNP for No chief complaint on file.  I have reviewed the referring providers note.     07/18/2022  Pt presents in f/u after referral to PT. Today pt reports similar symptoms from prior visit with continued left sided neck pain radiating to the left arm with associated numbness and tingling. Patient also having lower back pain radiating to her right leg. She reports numbness, tingling, and a cold sensation to her right leg with long periods of standing or walking. Plan from last visit was to start PT but patient unable to get appointment until the end of this month. Symptoms are the same and patient denies worsening symptoms. She has minimal options for pain relief due to history of liver disease and recently only taking BC powder for pain.      Symptom Location: Low back, RLE, and LUE  Symptom Character: tingling, stiffness and aching  Symptom Onset/Mechanism: chronic (>/= 3 months)  Temporal Pattern: constant  Aggravating Factors: bending, twisting   Alleviating Factors: lying down   Night Pain: no  Unintended Weight Loss: no  Fever/Infection:no  Neuromotor Function: motor weakness - (no),  gait/coordination disturbance - (no), loss of bowel or bladder control - (no), saddle anesthesia - (no)     Prior Interventions/Modalities:  - Chiropractor   - Medrol Dosepak     Meds:  - Gabapentin   - BC powder    Home Exercise Program:  None     Prior Diagnostics:  XR Thoracic Spine 12/19/2021  IMPRESSION:   Normal radiographs of the thoracic spine.    XR Lumbar Spine 12/19/2021  IMPRESSION:   Mild L2-3 degenerative disc disease.    XR Cervical Spine 10/17/2021  IMPRESSION:  Alignment of the C-spine maintained. No fractures. Mild multilevel facet joint osteoarthritis changes involving the entire C-spine.    Current Outpatient Medications   Medication Sig Dispense Refill    amLODIPine (NORVASC) 10 MG tablet Take 1 tablet (10 mg total) by mouth daily. 90 tablet 3    dandelion root (DANDELION ORAL) Take by mouth. tea      ELDERBERRY FRUIT ORAL Take by mouth. Supplement  Also takes a [place] mushroom extract capsules that  could not find in the database to add to her med list      gabapentin (NEURONTIN) 300 MG capsule Take 1 capsule (300 mg total) by mouth two (2) times a day. (Patient not taking: Reported on 10/29/2022) 60 capsule 3    imiquimod (ALDARA) 5 % cream APPLY TO AFFECTED AREA THREE TIMES WEEKLY 12 packet 1    levothyroxine (SYNTHROID) 100 MCG tablet TAKE 1 TABLET BY MOUTH EVERY DAY 90 tablet 3    magnesium oxide (MAG-OX) 400 mg (241.3 mg elemental magnesium) tablet Take 1 tablet by mouth two (2) times a day.      multivitamins, therapeutic with minerals 9 mg iron-400 mcg tablet Take 1 tablet by mouth daily. 90 tablet 0    naloxone (NARCAN) 4 mg nasal spray 1 spray into alternating nostrils once.      oxyCODONE (ROXICODONE) 5 MG immediate release tablet Take 1 tablet (5 mg total) by mouth every eight (8) hours as needed for pain. Prescribed by pain clinic      pantoprazole (PROTONIX) 40 MG tablet TAKE 1 TABLET BY MOUTH EVERY DAY 90 tablet 2    sertraline (ZOLOFT) 50 MG tablet TAKE 1 TABLET BY MOUTH EVERY DAY 90 tablet 1     No current facility-administered medications for this visit.       Allergies:   Patient has no known allergies.    Past Medical / Surgical History:     Past Medical History:   Diagnosis Date    Alcohol related seizure (CMS-HCC) 01/18/2018    Disease of thyroid gland     ETOH abuse     Other ascites 12/25/2020    Pancreatitis        Past Surgical History:   Procedure Laterality Date    PR UPPER GI ENDOSCOPY,DIAGNOSIS N/A 02/01/2021    Procedure: UGI ENDO, INCLUDE ESOPHAGUS, STOMACH, & DUODENUM &/OR JEJUNUM; DX W/WO COLLECTION SPECIMN, BY BRUSH OR WASH;  Surgeon: Marene Lenz, MD;  [facility];  Service: Gastroenterology    TUBAL LIGATION         Social History     Socioeconomic History    Marital status: Single   Tobacco Use    Smoking status: Every Day     Current packs/day: 1.50     Types: Cigarettes    Smokeless tobacco: Former     Types: Chew    Tobacco comments:     1-2ppd. Hopes to cut back   Vaping Use    Vaping status: Never Used   Substance and Sexual Activity    Alcohol use: Not Currently    Drug use: No     Social Determinants of Health     Financial Resource Strain: High Risk (12/18/2021)    Overall Financial Resource Strain (CARDIA)     Difficulty of Paying Living Expenses: Hard   Food Insecurity: Food Insecurity Present (12/18/2021)    Hunger Vital Sign     Worried About Running Out of Food in the Last  Year: Often true     Barista in the Last Year: Often true   Transportation Needs: No Transportation Needs (12/18/2021)    PRAPARE - Therapist, art (Medical): No     Lack of Transportation (Non-Medical): No       Family History   Problem Relation Age of Onset    Thyroid disease Mother     Heart attack Father     Thyroid disease Sister     Liver disease Brother        For any of the above entries which indicate no records on file, the patient reports no relevant history.                 Review of Systems:   Review of Systems was completed through a 10 organ system review and is listed in the chart.  Pertinent positives are noted in HPI or flowsheet and otherwise negative.  Patient has been instructed to followup with PCP or appropriate specialist for symptoms outside the purview of this speciality.    OBJECTIVE:     Vitals:     There were no vitals taken for this visit.    The above medications, allergies, history, and ROS, and vitals have been reviewed.    Physical Exam:   GEN: alert and oriented, no apparent distress  HEENT: normocephalic, atraumatic, anicteric, moist mucous membranes  CV: normal heart rate  PULM: normal work of breathing  GI: nondistended  EXT: no swelling, edema in b/l UE and LE  SKIN: no visible ecchymosis or breakdown  PSYCH: normal mood and affect    NEURO:   Manual Muscle Testing    Left Upper Extremity Right Upper Extremity   Elbow Flexion 5/5 5/5   Elbow Extension  5/5 5/5   Wrist Extension  5/5 5/5   Finger Abduction  5/5 5/5         Reflexes     Left Upper Extremity Right Upper Extremity    Triceps 2+ 2+   Biceps 1+ 1+   Brachioradialis 1+ 1+   Hoffman's  negative negative     Sensory  Sensation to light touch WNL throughout b/l UE and LE    MSK:  Gait and Station: normal nonantalgic gait with symmetric body posture    Cervical Spine:   Inspection: Normal alignment. No erythema, discoloration, or asymmetry.  Palpation: L cervical paraspinal tenderness. No vertebral body point tenderness.  ROM: normal flexion, extension, rotation, pain with left lateral bend    Lumbar Spine:  Inspection: Normal alignment. No erythema, discoloration, or asymmetry.  Palpation: No paraspinal muscle tenderness.  No vertebral body point tenderness.  ROM: normal flexion, extension, rotation, lateral bend       Maximino Sarin, MD  [facility]   Physical Medicine and Rehabilitation, [place]    ----------------------------------------------------------------------------------------------------------------------  March 01, 2023 2:16 PM. Documentation assistance provided by Raford Pitcher, medical scribe, at the direction of Riccardo Dubin, Judi Saa, MD.  ----------------------------------------------------------------------------------------------------------------------     Tia Masker, MD  Assistant Professor - PM&R  Musculoskeletal and Spine Specialist - [facility] [employer]

## 2023-03-30 NOTE — Unmapped (Unsigned)
[facility]  Physical Medicine and Rehabilitation     Patient Name:Erin Baxter  MRN: [id number]  DOB: June 05, 1976  Age: 48 y.o.     ----------------------------------------------------------------------------------------------------------------------  Mar 30, 2023 2:48 PM. Documentation assistance provided by [facility] medical scribe, at the direction of Riccardo Dubin, Judi Saa, MD.  ----------------------------------------------------------------------------------------------------------------------  ASSESSMENT & PLAN:     03/30/23     DIAGNOSIS:   Cervical spondylosis - suspect facet mediated pain with overlying myofascial pain exacerbated by poor posture but correctable with therapies.    Chronic low back pain with right lumbar radiculitis without red flags and mild spondylosis on Xray.    TREATMENT PLAN:   Physical Therapy to work on neck strength and posture. Scheduled for first session on 08/06/22.    Consider Cervical/Lumbar MRI and injection if symptoms worsen or fail to improve after 6 weeks of Physical Therapy     Meds per PCP; recommend discontinuing BC powder    NEXT STEPS/FOLLOW UP: 3 months    SUBJECTIVE:     Chief Complaint:    No chief complaint on file.  03/31/23  Pt presents in follow up after beginning and finishing PT for neck strength. Pt was still in pain following PT so she obtained a new MRI. Today, ***    MRI Cervical Spine Wo Contrast (09/27/22)  Impression:  Disc osteophyte complex at C4-C5 with uncovertebral and facet arthrosis contributing to mild spinal canal stenosis and moderate to severe left neural foraminal stenosis.  Disc osteophyte complex at C5-C6 causing minimal left neural foraminal narrowing.  No other significant disc osteophyte complex at any level.    History of Present Illness:   Erin Baxter is a 47 y.o. year old female being evaluated in consultation at the request of Deneise Lever, FNP for No chief complaint on file.  I have reviewed the referring providers note.     07/18/2022  Pt presents in f/u after referral to PT. Today pt reports similar symptoms from prior visit with continued left sided neck pain radiating to the left arm with associated numbness and tingling. Patient also having lower back pain radiating to her right leg. She reports numbness, tingling, and a cold sensation to her right leg with long periods of standing or walking. Plan from last visit was to start PT but patient unable to get appointment until the end of this month. Symptoms are the same and patient denies worsening symptoms. She has minimal options for pain relief due to history of liver disease and recently only taking BC powder for pain.      Symptom Location: Low back, RLE, and LUE  Symptom Character: tingling, stiffness and aching  Symptom Onset/Mechanism: chronic (>/= 3 months)  Temporal Pattern: constant  Aggravating Factors: bending, twisting   Alleviating Factors: lying down   Night Pain: no  Unintended Weight Loss: no  Fever/Infection:no  Neuromotor Function: motor weakness - (no),  gait/coordination disturbance - (no), loss of bowel or bladder control - (no), saddle anesthesia - (no)     Prior Interventions/Modalities:  - Chiropractor   - Medrol Dosepak     Meds:  - Gabapentin   - BC powder    Home Exercise Program:  None     Prior Diagnostics:  XR Thoracic Spine 12/19/2021  IMPRESSION:   Normal radiographs of the thoracic spine.    XR Lumbar Spine 12/19/2021  IMPRESSION:   Mild L2-3 degenerative disc disease.    XR Cervical Spine 10/17/2021  IMPRESSION:  Alignment of the C-spine maintained. No fractures. Mild multilevel facet joint osteoarthritis changes involving the entire C-spine.    Current Outpatient Medications   Medication Sig Dispense Refill    amLODIPine (NORVASC) 10 MG tablet Take 1 tablet (10 mg total) by mouth daily. 90 tablet 3    dandelion root (DANDELION ORAL) Take by mouth. tea      ELDERBERRY FRUIT ORAL Take by mouth. Supplement  Also takes a [place] mushroom extract capsules that  could not find in the database to add to her med list      gabapentin (NEURONTIN) 300 MG capsule Take 1 capsule (300 mg total) by mouth two (2) times a day. (Patient not taking: Reported on 10/29/2022) 60 capsule 3    imiquimod (ALDARA) 5 % cream APPLY TO AFFECTED AREA THREE TIMES WEEKLY 12 packet 1    levothyroxine (SYNTHROID) 100 MCG tablet TAKE 1 TABLET BY MOUTH EVERY DAY 90 tablet 3    magnesium oxide (MAG-OX) 400 mg (241.3 mg elemental magnesium) tablet Take 1 tablet by mouth two (2) times a day.      multivitamins, therapeutic with minerals 9 mg iron-400 mcg tablet Take 1 tablet by mouth daily. 90 tablet 0    naloxone (NARCAN) 4 mg nasal spray 1 spray into alternating nostrils once.      oxyCODONE (ROXICODONE) 5 MG immediate release tablet Take 1 tablet (5 mg total) by mouth every eight (8) hours as needed for pain. Prescribed by pain clinic      pantoprazole (PROTONIX) 40 MG tablet TAKE 1 TABLET BY MOUTH EVERY DAY 90 tablet 2    sertraline (ZOLOFT) 50 MG tablet TAKE 1 TABLET BY MOUTH EVERY DAY 90 tablet 1     No current facility-administered medications for this visit.       Allergies:   Patient has no known allergies.    Past Medical / Surgical History:     Past Medical History:   Diagnosis Date    Alcohol related seizure (CMS-HCC) 01/18/2018    Disease of thyroid gland     ETOH abuse     Other ascites 12/25/2020    Pancreatitis        Past Surgical History:   Procedure Laterality Date    PR UPPER GI ENDOSCOPY,DIAGNOSIS N/A 02/01/2021    Procedure: UGI ENDO, INCLUDE ESOPHAGUS, STOMACH, & DUODENUM &/OR JEJUNUM; DX W/WO COLLECTION SPECIMN, BY BRUSH OR WASH;  Surgeon: Marene Lenz, MD;  [facility];  Service: Gastroenterology    TUBAL LIGATION         Social History     Socioeconomic History    Marital status: Single   Tobacco Use    Smoking status: Every Day     Current packs/day: 1.50     Types: Cigarettes    Smokeless tobacco: Former     Types: Chew    Tobacco comments:     1-2ppd. Hopes to cut back   Vaping Use    Vaping status: Never Used   Substance and Sexual Activity    Alcohol use: Not Currently    Drug use: No     Social Determinants of Health     Financial Resource Strain: High Risk (12/18/2021)    Overall Financial Resource Strain (CARDIA)     Difficulty of Paying Living Expenses: Hard   Food Insecurity: Food Insecurity Present (12/18/2021)    Hunger Vital Sign     Worried About Running Out of Food in the Last  Year: Often true     Barista in the Last Year: Often true   Transportation Needs: No Transportation Needs (12/18/2021)    PRAPARE - Therapist, art (Medical): No     Lack of Transportation (Non-Medical): No       Family History   Problem Relation Age of Onset    Thyroid disease Mother     Heart attack Father     Thyroid disease Sister     Liver disease Brother        For any of the above entries which indicate no records on file, the patient reports no relevant history.                 Review of Systems:   Review of Systems was completed through a 10 organ system review and is listed in the chart.  Pertinent positives are noted in HPI or flowsheet and otherwise negative.  Patient has been instructed to followup with PCP or appropriate specialist for symptoms outside the purview of this speciality.    OBJECTIVE:     Vitals:     There were no vitals taken for this visit.    The above medications, allergies, history, and ROS, and vitals have been reviewed.    Physical Exam:   GEN: alert and oriented, no apparent distress  HEENT: normocephalic, atraumatic, anicteric, moist mucous membranes  CV: normal heart rate  PULM: normal work of breathing  GI: nondistended  EXT: no swelling, edema in b/l UE and LE  SKIN: no visible ecchymosis or breakdown  PSYCH: normal mood and affect    NEURO:   Manual Muscle Testing    Left Upper Extremity Right Upper Extremity   Elbow Flexion 5/5 5/5   Elbow Extension  5/5 5/5   Wrist Extension  5/5 5/5   Finger Abduction  5/5 5/5         Reflexes     Left Upper Extremity Right Upper Extremity    Triceps 2+ 2+   Biceps 1+ 1+   Brachioradialis 1+ 1+   Hoffman's  negative negative     Sensory  Sensation to light touch WNL throughout b/l UE and LE    MSK:  Gait and Station: normal nonantalgic gait with symmetric body posture    Cervical Spine:   Inspection: Normal alignment. No erythema, discoloration, or asymmetry.  Palpation: L cervical paraspinal tenderness. No vertebral body point tenderness.  ROM: normal flexion, extension, rotation, pain with left lateral bend    Lumbar Spine:  Inspection: Normal alignment. No erythema, discoloration, or asymmetry.  Palpation: No paraspinal muscle tenderness.  No vertebral body point tenderness.  ROM: normal flexion, extension, rotation, lateral bend     ----------------------------------------------------------------------------------------------------------------------  Mar 30, 2023 2:48 PM. Documentation assistance provided by [facility] medical scribe, at the direction of Riccardo Dubin, Judi Saa, MD.  ----------------------------------------------------------------------------------------------------------------------    Tia Masker, MD  Assistant Professor - PM&R  Musculoskeletal and Spine Specialist - [facility] [employer]

## 2023-04-02 ENCOUNTER — Ambulatory Visit
Admit: 2023-04-02 | Discharge: 2023-04-03 | Payer: MEDICAID | Attending: Physical Medicine & Rehabilitation | Primary: Physical Medicine & Rehabilitation

## 2023-04-02 DIAGNOSIS — M5410 Radiculopathy, site unspecified: Principal | ICD-10-CM

## 2023-04-02 MED ORDER — DIAZEPAM 5 MG TABLET
ORAL_TABLET | Freq: Once | ORAL | 0 refills | 1 days | Status: CP
Start: 2023-04-02 — End: 2023-04-02

## 2023-04-02 NOTE — Unmapped (Signed)
[facility]  Physical Medicine and Rehabilitation     Patient Name:Erin Baxter  MRN: [id number]  DOB: December 15, 1975  Age: 47 y.o.     ----------------------------------------------------------------------------------------------------------------------  Apr 02, 2023 10:16 AM. Documentation assistance provided by Ceasar Mons, medical scribe, at the direction of Inocencio Homes, MD.  ----------------------------------------------------------------------------------------------------------------------     ASSESSMENT & PLAN:     04/02/23     DIAGNOSIS:   Left Cervical radiculitis    Cervical myofacial pain    Chronic low back pain with right lumbar radiculitis without red flags and mild spondylosis on Xray.    TREATMENT PLAN:     Ordered C7-T1 ILESI (epidural steroid shots) to help with radiculitis. You will be contacted within two weeks to schedule an appointment.     Prescribed Valium. Take 1 tablet (5 mg total) by mouth once for 1 dose. Take prior to procedure.    Pain meds per provider prescribing oxycodone.    NEXT STEPS/FOLLOW UP: 4 months  Future: If you change your mind about dry needling your neck muscles, we can consider this as well.     Will address lower back after we see how the cervical epidural goes.    SUBJECTIVE:     Chief Complaint:   Pinched Nerve    04/02/2023:  Pt presents in follow up after beginning and finishing PT for neck strength. Pt was still in pain following PT so she obtained a new MRI. Today, pt reports the pain is worsening. She states she only gets 3 hours a day where she is able to freely move around. Starting from her armpit, she is numb down her arm through her palm and fingers. She is no longer taking Gabapentin, but she continues taking oxycodone.     MRI Cervical Spine Wo Contrast (09/27/22)  Impression:  Disc osteophyte complex at C4-C5 with uncovertebral and facet arthrosis contributing to mild spinal canal stenosis and moderate to severe left neural foraminal stenosis.  Disc osteophyte complex at C5-C6 causing minimal left neural foraminal narrowing.  No other significant disc osteophyte complex at any level.    History of Present Illness:   Erin Baxter is a 47 y.o. year old female being evaluated in consultation at the request of Deneise Lever, FNP for chronic right-sided low back pain with right-sided sciatica.    I have reviewed the referring providers note.     07/18/2022  Pt presents in f/u after referral to PT. Today pt reports similar symptoms from prior visit with continued left sided neck pain radiating to the left arm with associated numbness and tingling. Patient also having lower back pain radiating to her right leg. She reports numbness, tingling, and a cold sensation to her right leg with long periods of standing or walking. Plan from last visit was to start PT but patient unable to get appointment until the end of this month. Symptoms are the same and patient denies worsening symptoms. She has minimal options for pain relief due to history of liver disease and recently only taking BC powder for pain.    Symptom Location: Low back, RLE, and LUE  Symptom Character: tingling, stiffness and aching  Symptom Onset/Mechanism: chronic (>/= 3 months)  Temporal Pattern: constant  Aggravating Factors: bending, twisting   Alleviating Factors: lying down   Night Pain: no  Unintended Weight Loss: no  Fever/Infection:no  Neuromotor Function: motor weakness - (no),  gait/coordination disturbance - (no), loss of bowel or bladder control - (no), saddle  anesthesia - (no)     Prior Interventions/Modalities:  - Chiropractor   - Medrol Dosepak     Meds:  - Gabapentin   - BC powder    Home Exercise Program:  None     Prior Diagnostics:  XR Thoracic Spine 12/19/2021  IMPRESSION:   Normal radiographs of the thoracic spine.    XR Lumbar Spine 12/19/2021  IMPRESSION:   Mild L2-3 degenerative disc disease.    XR Cervical Spine 10/17/2021  IMPRESSION:   Alignment of the C-spine maintained. No fractures. Mild multilevel facet joint osteoarthritis changes involving the entire C-spine.    Current Outpatient Medications   Medication Sig Dispense Refill    amLODIPine (NORVASC) 10 MG tablet Take 1 tablet (10 mg total) by mouth daily. 90 tablet 3    dandelion root (DANDELION ORAL) Take by mouth. tea      ELDERBERRY FRUIT ORAL Take by mouth. Supplement  Also takes a [place] mushroom extract capsules that  could not find in the database to add to her med list      gabapentin (NEURONTIN) 300 MG capsule Take 1 capsule (300 mg total) by mouth two (2) times a day. (Patient not taking: Reported on 10/29/2022) 60 capsule 3    imiquimod (ALDARA) 5 % cream APPLY TO AFFECTED AREA THREE TIMES WEEKLY 12 packet 1    levothyroxine (SYNTHROID) 100 MCG tablet TAKE 1 TABLET BY MOUTH EVERY DAY 90 tablet 3    magnesium oxide (MAG-OX) 400 mg (241.3 mg elemental magnesium) tablet Take 1 tablet by mouth two (2) times a day.      multivitamins, therapeutic with minerals 9 mg iron-400 mcg tablet Take 1 tablet by mouth daily. 90 tablet 0    naloxone (NARCAN) 4 mg nasal spray 1 spray into alternating nostrils once.      oxyCODONE (ROXICODONE) 5 MG immediate release tablet Take 1 tablet (5 mg total) by mouth every eight (8) hours as needed for pain. Prescribed by pain clinic      pantoprazole (PROTONIX) 40 MG tablet TAKE 1 TABLET BY MOUTH EVERY DAY 90 tablet 2    sertraline (ZOLOFT) 50 MG tablet TAKE 1 TABLET BY MOUTH EVERY DAY 90 tablet 1     No current facility-administered medications for this visit.     Allergies:   Patient has no known allergies.    Past Medical / Surgical History:     Past Medical History:   Diagnosis Date    Alcohol related seizure (CMS-HCC) 01/18/2018    Disease of thyroid gland     ETOH abuse     Other ascites 12/25/2020    Pancreatitis      Past Surgical History:   Procedure Laterality Date    PR UPPER GI ENDOSCOPY,DIAGNOSIS N/A 02/01/2021    Procedure: UGI ENDO, INCLUDE ESOPHAGUS, STOMACH, & DUODENUM &/OR JEJUNUM; DX W/WO COLLECTION SPECIMN, BY BRUSH OR WASH;  Surgeon: Marene Lenz, MD;  [facility];  Service: Gastroenterology    TUBAL LIGATION       Social History     Socioeconomic History    Marital status: Single   Tobacco Use    Smoking status: Every Day     Current packs/day: 1.50     Types: Cigarettes    Smokeless tobacco: Former     Types: Chew    Tobacco comments:     1-2ppd. Hopes to cut back   Vaping Use    Vaping status: Never Used  Substance and Sexual Activity    Alcohol use: Not Currently    Drug use: No     Social Determinants of Health     Financial Resource Strain: High Risk (12/18/2021)    Overall Financial Resource Strain (CARDIA)     Difficulty of Paying Living Expenses: Hard   Food Insecurity: Food Insecurity Present (12/18/2021)    Hunger Vital Sign     Worried About Running Out of Food in the Last Year: Often true     Ran Out of Food in the Last Year: Often true   Transportation Needs: No Transportation Needs (12/18/2021)    PRAPARE - Therapist, art (Medical): No     Lack of Transportation (Non-Medical): No     Family History   Problem Relation Age of Onset    Thyroid disease Mother     Heart attack Father     Thyroid disease Sister     Liver disease Brother      For any of the above entries which indicate no records on file, the patient reports no relevant history.                 Review of Systems:   Review of Systems was completed through a 10 organ system review and is listed in the chart.  Pertinent positives are noted in HPI or flowsheet and otherwise negative.  Patient has been instructed to followup with PCP or appropriate specialist for symptoms outside the purview of this speciality.    OBJECTIVE:     Vitals:     There were no vitals taken for this visit.    The above medications, allergies, history, and ROS, and vitals have been reviewed.    Physical Exam:   GEN: alert and oriented, no apparent distress  HEENT: normocephalic, atraumatic, anicteric, moist mucous membranes  CV: normal heart rate  PULM: normal work of breathing  GI: nondistended  EXT: no swelling, edema in b/l UE and LE  SKIN: no visible ecchymosis or breakdown  PSYCH: normal mood and affect    NEURO:   Manual Muscle Testing    Left Upper Extremity Right Upper Extremity   Elbow Flexion 5/5 5/5   Elbow Extension  5/5 5/5   Wrist Extension  5/5 5/5   Finger Abduction  5/5 5/5      Reflexes     Left Upper Extremity Right Upper Extremity    Triceps 2+ 2+   Biceps 2+ 2+   Brachioradialis 2+ 2+   Hoffman's  negative negative     Sensory  Sensation to light touch WNL throughout b/l UE and LE.    MSK:  Gait and Station: normal nonantalgic gait with symmetric body posture.    ----------------------------------------------------------------------------------------------------------------------  Apr 02, 2023 10:16 AM. Documentation assistance provided by Ceasar Mons, medical scribe, at the direction of Riccardo Dubin, Judi Saa, MD.  ----------------------------------------------------------------------------------------------------------------------     Tia Masker, MD  Assistant Professor - PM&R  Musculoskeletal and Spine Specialist - [facility] [employer]

## 2023-04-02 NOTE — Unmapped (Signed)
I provided the patient with a copy of the injection instruction sheet and reviewed to prepare for an injection. A Patient Decision Aid was used during the education session. Patient will hold MVI and Vitamin E, and fish oil for 2d prior to the injection. Knows: to bring a driver, to have nothing by mouth 2 hours prior to the appointment, to arrive 30 minutes prior to appointment and to call and reschedule if there are signs/symptoms of illness or if antibiotics have been started for any reason.

## 2023-04-02 NOTE — Unmapped (Signed)
Thanks so much for coming to see Dr. Riccardo Dubin today. It was a pleasure to meet you. This summary reviews the goals and plans we discussed at your visit today.     Below, you will see: a) your working diagnosis b) your treatment plan and c) your next steps and followup plan.    We care about your quality of life and are committed to helping optimize your functionality.     DIAGNOSIS:   Left Cervical radiculitis    Cervical myofacial pain    Chronic low back pain with right lumbar radiculitis without red flags and mild spondylosis on Xray.    TREATMENT PLAN:     Ordered C7-T1 ILESI (epidural steroid shots) to help with radiculitis. You will be contacted within two weeks to schedule an appointment.     Prescribed Valium. Take 1 tablet (5 mg total) by mouth once for 1 dose. Take prior to procedure.    Pain meds per provider prescribing oxycodone.    NEXT STEPS/FOLLOW UP: 4 months  Future: If you change your mind about dry needling your neck muscles, we can consider this as well.     Will address lower back after we see how the cervical epidural goes.

## 2023-04-15 DIAGNOSIS — N760 Acute vaginitis: Principal | ICD-10-CM

## 2023-04-15 MED ORDER — FLUCONAZOLE 150 MG TABLET
ORAL_TABLET | Freq: Once | ORAL | 0 refills | 2 days | Status: CP
Start: 2023-04-15 — End: 2023-04-15

## 2023-04-17 DIAGNOSIS — K7031 Alcoholic cirrhosis of liver with ascites: Principal | ICD-10-CM

## 2023-04-17 DIAGNOSIS — K7469 Other cirrhosis of liver: Principal | ICD-10-CM

## 2023-04-17 DIAGNOSIS — K7030 Alcoholic cirrhosis of liver without ascites: Principal | ICD-10-CM

## 2023-04-17 DIAGNOSIS — F1091 Alcohol use disorder in remission: Principal | ICD-10-CM

## 2023-04-29 ENCOUNTER — Ambulatory Visit: Admit: 2023-04-29 | Discharge: 2023-04-30 | Payer: MEDICAID

## 2023-04-29 DIAGNOSIS — K7469 Other cirrhosis of liver: Principal | ICD-10-CM

## 2023-04-29 NOTE — Unmapped (Signed)
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 female presenting for follow-up of alcohol associated cirrhosis, She has not had any alcohol for over 2 years and has recompensated. Cirrhosis previously exacerbated by portal hypertension, ascites, and hepatic encephalopathy. No current indications for liver transplant.    Alcohol-associated cirrhosis of liver (CMS-HCC)  HCC surveillance every 6 months with ultrasound and AFP, due now. She was advised to schedule at [name].  No history of esophageal varices. Platelets have normalized and Fibroscan in 2023 did not suggest clinically significant portal hypertension, so she does not currently need EGDs for EV surveillance.   Increase protein intake with goal of 100 g of protein per day.  Avoid NSAIDs. Acetaminophen up to 2 g/day is okay.  Previously declined vaccines; did not discuss today.    Alcohol use disorder in sustained remission  Commended on her sobriety and encouraged her to continue to avoid alcohol    Return in about 6 months (around 10/29/2023) for Video Visit.    Subjective   History of Present Illness   Accompanied by: N/A (unaccompanied)    47 y.o. female with GAD, MDD, chronic pain, alcohol use disorder in remission presenting for follow up of alcohol-associated cirrhosis. Her cirrhosis has been exacerbated by portal hypertension, ascites, and hepatic encephalopathy. Her ascites and hepatic encephalopathy have resolved following alcohol cessation. History of hospitalizations for intoxication, withdrawal, and withdrawal seizures. Diagnosed with cirrhosis during hospitalization in December 2021. No history of GI bleeding or paracentesis.     Interval history:   Last visit on 10/29/2022 with Dr. Eudelia Bunch. No interval hospitalizations or changes in her health.Stopped rifaximin 2 weeks before her last visit and did not have any recurrent confusion. She reports feeling well overall. She denies any abdominal pain or distention, lower extremity edema, jaundice, fever, melena, or hematochezia. Has not had alcohol in over 2 years.     Objective   Physical Exam   Vital Signs: BP 119/82  - Pulse 86  - Temp 36.7 ??C (98.1 ??F) (Tympanic)  - Wt 70.8 kg (156 lb)  - SpO2 97%  - BMI 24.43 kg/m??   Constitutional: She is in no apparent distress  Eyes: Anicteric sclerae  Cardiovascular: No peripheral edema  Gastrointestinal: Soft, nontender abdomen without hepatosplenomegaly, hernias, or masses  Neurologic: Awake, alert, and oriented to person, place, and time with normal speech and no asterixis    Labs ordered: CBC, CMP, INR, HBV surface antibody, HCV IgG, AFP, Peth.  Patient plans to complete these locally given time constraints today.    I personally spent 30 minutes face-to-face and non-face-to-face in the care of this patient, which includes all pre, intra, and post visit time on the date of service.

## 2023-04-29 NOTE — Unmapped (Addendum)
[facility]  [facility]  Dorene Sorrow, [place]   [facility]  [address]  [place], [place] [address]  Main Clinic: [phone]  Appointment Schedulers: [phone]  Silvestre Moment, RN: [phone]  Iver Nestle, RN: [phone]  Fax: [phone]       Thank you for allowing me to participate in your medical care today. Here are my recommendations based on today's visit:    Call [phone] to schedule your imaging study at [facility]. Also get labs completed at [facility].    Take care,  Dorene Sorrow, [place]

## 2023-05-05 ENCOUNTER — Ambulatory Visit: Admit: 2023-05-05 | Discharge: 2023-05-06 | Payer: MEDICAID

## 2023-05-05 DIAGNOSIS — N760 Acute vaginitis: Principal | ICD-10-CM

## 2023-05-05 DIAGNOSIS — K7469 Other cirrhosis of liver: Principal | ICD-10-CM

## 2023-05-05 DIAGNOSIS — G8929 Other chronic pain: Principal | ICD-10-CM

## 2023-05-05 DIAGNOSIS — M5441 Lumbago with sciatica, right side: Principal | ICD-10-CM

## 2023-05-05 DIAGNOSIS — E039 Hypothyroidism, unspecified: Principal | ICD-10-CM

## 2023-05-05 DIAGNOSIS — F332 Major depressive disorder, recurrent severe without psychotic features: Principal | ICD-10-CM

## 2023-05-05 DIAGNOSIS — I10 Essential (primary) hypertension: Principal | ICD-10-CM

## 2023-05-05 DIAGNOSIS — F411 Generalized anxiety disorder: Principal | ICD-10-CM

## 2023-05-05 DIAGNOSIS — F17200 Nicotine dependence, unspecified, uncomplicated: Principal | ICD-10-CM

## 2023-05-05 MED ORDER — METRONIDAZOLE 500 MG TABLET
ORAL_TABLET | Freq: Two times a day (BID) | ORAL | 0 refills | 7 days | Status: CP
Start: 2023-05-05 — End: 2023-05-12

## 2023-05-05 MED ORDER — SERTRALINE 50 MG TABLET
ORAL_TABLET | Freq: Every day | ORAL | 1 refills | 90 days | Status: CP
Start: 2023-05-05 — End: ?

## 2023-05-05 MED ORDER — VALACYCLOVIR 1 GRAM TABLET
ORAL_TABLET | Freq: Two times a day (BID) | ORAL | 0 refills | 7 days | Status: CP
Start: 2023-05-05 — End: 2023-05-12

## 2023-05-05 NOTE — Unmapped (Signed)
Assessment and Plan:     Erin Baxter was seen today for vaginitis and back pain.    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        -    Home care advice and reassurance  -     Wet prep, genital: pending  -     POCT urinalysis dipstick: negative for infection  -     HSV PCR: pending  -     valACYclovir (VALTREX) 1000 MG tablet; Take 1 tablet (1,000 mg total) by mouth two (2) times a day for 7 days.    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 daily        -    Declined referral for therapy.  Self-care: Exercise, healthy diet, stress management, sleep hygiene, social connections.   -     sertraline (ZOLOFT) 50 MG tablet; Take 1 tablet (50 mg total) by mouth daily.    Chronic right-sided low back pain with right-sided sciatica          -     Gentle activity, stretches. Advance activity as tolerated.              Avoidance of repetitive movements, caution with heavy lifting.              OTC analgesia as needed for pain.  Given chronicity of symptoms, lumbar spine MRI ordered.             Pain managed by Pain clinic-5 mg oxycodone QID as needed for pain.             Return to clinic if no improvement, worsening or new symptoms, or patient concerns.    -     MRI Lumbar Spine W Wo Contrast; Future      Other cirrhosis of liver (CMS-HCC)            -Followed by liver clinic, last visit April 29, 2023, no ETOH > 2 years          -Patient states liver health and enzymes stable.      Essential hypertension          -Patient stopped medication has been monitoring blood pressures at home        -Blood pressures normotensive per patient report 120-130/70-80, continue to monitor.        - Diet advice--DASH diet, activity.    Hypothyroidism, unspecified type          -Adherent with 100 mg levothyroxine,  last TSH: 0.655 (10/23)    Barriers to recommended plan: None identified    Return if symptoms worsen or fail to improve, for 10/24: follow up .      Subjective:     HPI: Erin Baxter is a 47 y.o. female here for Vaginitis and Back Pain.    Vaginitis:  Thinks due to change in toilet paper type, developed burning, itching and small blisters outer vaginal area/labia-treated with OTC yeast medication, oral yeast tablet.  Does not feel symptoms have resolved, symptoms getting worse. Hard time sitting due to pain/discomfort. 1 female partner in past 4 years    Neck pain: getting ready to have steroid neck injection. Feeling stress about procedure    R lower back pain: pain in R hip, radiates to RLE, sometimes leg gives out. Stabbing pains in RLE  Has lumbar spine x-ray: 2/23: Grade 1 L5-S1 retrolisthesis.   Feels pain and symptoms getting worse, not improving     Mental health:  Angry, irritable, anxious all the time, family members are recommending pt restart medications for mental health  Agreeable to start 50 mg sertraline. No SI, no HI    HTN: not taking 10 mg amlodipine, states Bps at home off medication 120-130/70-80  Watching diet--protein, fruits/vegetables.    Cirrhosis of liver: last seen by liver clinic 04/30/23, stopped ETOH > 2 years ago, no temptations, no cravings    Hypothyroid: taking 100 mcg levothyroxine, last TSH: 0.655 (10/23)    Chronic pain: Taking oxycodone 5 mg 4 times daily as needed for pain, managed by chronic pain clinic in [facility].  Last PDMP Review: 04/15/2023  5:00 PM   I have reviewed past medical, surgical, medications, allergies, social and family histories today and updated them in Epic where appropriate.    ROS:   PHQ-9 PHQ-9 TOTAL SCORE   05/05/2023  11:15 AM 22   09/02/2022   2:00 PM 24   07/03/2022   1:00 PM 24   05/07/2022  10:00 AM 24 02/20/2022   1:00 PM 20      GAD7 Total Score GAD-7 Total Score   05/05/2023  11:15 AM 21   09/02/2022   2:00 PM 19   07/03/2022   1:00 PM 21   05/07/2022  10:00 AM 21   02/20/2022   1:00 PM 15        Review of systems negative unless otherwise noted as per HPI.      Objective:     Vitals:    05/05/23 1002   BP: 120/70   Pulse: 94   Temp: 36.9 ??C (98.4 ??F)   SpO2: 96%     Body mass index is 24.28 kg/m??.    Physical Exam  Vitals and nursing note reviewed. Chaperone present: declined.   Constitutional:       Appearance: Normal appearance.   HENT:      Head: Normocephalic and atraumatic.      Nose: Nose normal.   Cardiovascular:      Rate and Rhythm: Normal rate and regular rhythm.      Pulses: Normal pulses.      Heart sounds: Normal heart sounds.   Pulmonary:      Effort: Pulmonary effort is normal.      Breath sounds: Normal breath sounds.   Genitourinary:     Exam position: Lithotomy position.       Musculoskeletal:         General: Normal range of motion.      Cervical back: Normal range of motion and neck supple.   Skin:     General: Skin is warm and dry.      Capillary Refill: Capillary refill takes less than 2 seconds.   Neurological:      General: No focal deficit present.      Mental Status: She is alert and oriented to person, place, and time. Mental status is at baseline.   Psychiatric:  Mood and Affect: Mood normal.         Behavior: Behavior normal.         Thought Content: Thought content normal.         Judgment: Judgment normal.            Medication adherence and barriers to the treatment plan have been addressed. Opportunities to optimize healthy behaviors have been discussed. Patient / caregiver voiced understanding.   I personally spent 30 minutes face-to-face and non-face-to-face in the care of this patient, which includes all pre, intra, and post visit time on the date of service.  Cleon Dew, DNP, FNP-C  [facility] at [facility]  [phone] (F)    Note - This record has been created using [employer]. Chart creation errors have been sought, but may not always have been located. Such creation errors do not reflect on the standard of medical care.

## 2023-05-09 DIAGNOSIS — I10 Essential (primary) hypertension: Principal | ICD-10-CM

## 2023-05-09 DIAGNOSIS — A630 Anogenital (venereal) warts: Principal | ICD-10-CM

## 2023-05-09 MED ORDER — IMIQUIMOD 5 % TOPICAL CREAM PACKET
PACK | 1 refills | 0 days
Start: 2023-05-09 — End: ?

## 2023-05-09 MED ORDER — AMLODIPINE 10 MG TABLET
ORAL_TABLET | Freq: Every day | ORAL | 3 refills | 0 days
Start: 2023-05-09 — End: ?

## 2023-05-12 DIAGNOSIS — A630 Anogenital (venereal) warts: Principal | ICD-10-CM

## 2023-05-12 MED ORDER — IMIQUIMOD 5 % TOPICAL CREAM PACKET
PACK | TOPICAL | 1 refills | 0.00000 days | Status: CP
Start: 2023-05-12 — End: 2024-05-11

## 2023-05-12 MED ORDER — AMLODIPINE 10 MG TABLET
ORAL_TABLET | Freq: Every day | ORAL | 1 refills | 90 days
Start: 2023-05-12 — End: 2024-05-11

## 2023-05-12 NOTE — Unmapped (Signed)
Patient is requesting the following refill  Requested Prescriptions     Pending Prescriptions Disp Refills    imiquimod (ALDARA) 5 % cream [Pharmacy Med Name: IMIQUIMOD 5% CREAM PACKET] 12 packet 1     Sig: APPLY TO AFFECTED AREA THREE TIMES WEEKLY    amlodipine (NORVASC) 10 MG tablet [Pharmacy Med Name: AMLODIPINE BESYLATE 10 MG TAB] 90 tablet 1     Sig: Take 1 tablet (10 mg total) by mouth daily.       Recent Visits  Date Type Provider Dept   05/05/23 Office Visit Deneise Lever, FNP [place] [facility] [address] [facility]   09/02/22 Office Visit Johnn Hai, Loleta Rose, FNP [place] [facility] [address] [facility]   07/03/22 Office Visit Johnn Hai, Loleta Rose, FNP [place] [facility] [address] At [facility]   Showing recent visits within past 365 days and meeting all other requirements  Future Appointments  Date Type Provider Dept   09/04/23 Appointment Deneise Lever, FNP [place] [facility] [address] [facility]   Showing future appointments within next 365 days and meeting all other requirements       Labs:   Potassium:   Potassium (mmol/L)   Date Value   10/29/2022 4.4    Sodium:   Sodium (mmol/L)   Date Value   10/29/2022 139

## 2023-05-12 NOTE — Unmapped (Signed)
Amlodipine stopped.

## 2023-05-19 NOTE — Unmapped (Signed)
Patient did not show for today's behavioral health appointment. LCSW called patient in hopes of discussing this missed appointment and to reschedule, and patient did not answer.  Treatment is now discontinued until patient returns call.

## 2023-05-20 NOTE — Unmapped (Signed)
Outreached to patient to schedule Behavioral Health Visit.  Patient was unable to be contacted. Left Message.    Abstraction Result Flowsheet Data    This patient's last AWV date: [facility] Last Medicare Wellness Visit Date: Not Found  This patients last WCC/CPE date: : Not Found      Reason for Encounter  Reason for Encounter: Outreach  Primary Reason for Outreach: [employer] Follow Up  Text Message: No  MyChart Message: No  Outreach Call Outcome: Left message

## 2023-05-30 ENCOUNTER — Ambulatory Visit: Admit: 2023-05-30 | Discharge: 2023-05-31 | Payer: PRIVATE HEALTH INSURANCE

## 2023-06-20 NOTE — Unmapped (Signed)
Patient LVM that she needs to schedule epidural injection at [facility] and she is a patient of Dr Riccardo Dubin.  Per the chart, the referral is closed.  Message sent to radiology scheduler.  1401--The patient was called back and she said she wants to have injection in her neck at [facility]  They called her to set it up but she lost the number to call them back. Given switchboard number and told to ask the operator for the [facility] Rehab clinic and they should know where to direct her from there.

## 2023-06-24 ENCOUNTER — Ambulatory Visit: Admit: 2023-06-24 | Discharge: 2023-06-25 | Payer: PRIVATE HEALTH INSURANCE

## 2023-06-24 MED ADMIN — gadobenate dimeglumine (MULTIHANCE) 529 mg/mL (0.1mmol/0.2mL) solution 14 mL: 14 mL | INTRAVENOUS | @ 21:00:00 | Stop: 2023-06-24

## 2023-07-11 DIAGNOSIS — K219 Gastro-esophageal reflux disease without esophagitis: Principal | ICD-10-CM

## 2023-07-11 DIAGNOSIS — I10 Essential (primary) hypertension: Principal | ICD-10-CM

## 2023-07-11 MED ORDER — PANTOPRAZOLE 40 MG TABLET,DELAYED RELEASE
ORAL_TABLET | Freq: Every day | ORAL | 2 refills | 90.00000 days | Status: CP
Start: 2023-07-11 — End: ?

## 2023-07-11 MED ORDER — AMLODIPINE 10 MG TABLET
ORAL_TABLET | Freq: Every day | ORAL | 3 refills | 90 days
Start: 2023-07-11 — End: ?

## 2023-07-11 NOTE — Unmapped (Signed)
Patient is requesting the following refill  Requested Prescriptions     Pending Prescriptions Disp Refills    pantoprazole (PROTONIX) 40 MG tablet [Pharmacy Med Name: PANTOPRAZOLE SOD DR 40 MG TAB] 90 tablet 2     Sig: TAKE 1 TABLET BY MOUTH EVERY DAY    amlodipine (NORVASC) 10 MG tablet [Pharmacy Med Name: AMLODIPINE BESYLATE 10 MG TAB] 90 tablet 3     Sig: TAKE 1 TABLET BY MOUTH EVERY DAY       Recent Visits  Date Type Provider Dept   05/05/23 Office Visit Deneise Lever, FNP [place] [facility] [address] [facility]   09/02/22 Office Visit Johnn Hai, Loleta Rose, FNP [place] [facility] [address] [facility]   Showing recent visits within past 365 days and meeting all other requirements  Future Appointments  Date Type Provider Dept   07/16/23 Appointment Deneise Lever, FNP [place] [facility] [address] [facility]   Showing future appointments within next 365 days and meeting all other requirements

## 2023-07-16 ENCOUNTER — Ambulatory Visit: Admit: 2023-07-16 | Discharge: 2023-07-17 | Payer: PRIVATE HEALTH INSURANCE

## 2023-07-16 DIAGNOSIS — F32A Anxiety and depression: Principal | ICD-10-CM

## 2023-07-16 DIAGNOSIS — F419 Anxiety disorder, unspecified: Principal | ICD-10-CM

## 2023-07-16 DIAGNOSIS — F1091 Alcohol use disorder in remission: Principal | ICD-10-CM

## 2023-07-16 DIAGNOSIS — G4700 Insomnia, unspecified: Principal | ICD-10-CM

## 2023-07-16 MED ORDER — QUETIAPINE 25 MG TABLET
ORAL_TABLET | Freq: Every evening | ORAL | 1 refills | 30 days | Status: CP
Start: 2023-07-16 — End: 2023-08-15

## 2023-07-16 MED ORDER — FLUOXETINE 20 MG CAPSULE
ORAL_CAPSULE | 2 refills | 0 days | Status: CP
Start: 2023-07-16 — End: ?

## 2023-07-16 NOTE — Unmapped (Signed)
Assessment and Plan:     Erin Baxter was seen today for follow-up.    Diagnoses and all orders for this visit:    Anxiety and depression  Insomnia, unspecified type          -     Patient feels anxiety poorly controlled, irritable, periods of sadness, using poor coping mechanisms like binge eating to cope with stress.        -     Has been trialed on sertraline--stopped due to adverse effects, felt best on Prozac, agreeable to restart.        -    Plan: Start 20 mg p.o. daily for 1 week, if tolerating without adverse effects can increase to 2 tablets p.o. daily        -    Patient agreeable to try 25 mg Seroquel at night for sleep.        -    Currently patient motivated to get better, good insight into illness.  No SI, no HI, no auditory/visual hallucinations        -     Given poor response to various SSRIs, uncertain diagnosis--referred to [facility] psychiatry        -    Patient agreeable for referrals, plans to continue working with virtual LCSW team.  -     FLUoxetine (PROZAC) 20 MG capsule; Week 1 : 1 tablet po every day ( 20 mg)   week 2: 2 tablets po every day (40 mg)  -     QUEtiapine (SEROQUEL) 25 MG tablet; Take 1 tablet (25 mg total) by mouth nightly.  -     Ambulatory referral to Psychiatry; Future    Alcohol use disorder in remission          -     Despite poorly controlled anxiety, recent stressors--patient remains sober, committed to sobriety        -     Has 2 sponsors via AA  -     Ambulatory referral to Psychiatry; Future    Declined vaccines today.        Barriers to recommended plan: None identified    Return for 2 months .      Subjective:     HPI: Erin Baxter is a 47 y.o. female here for Follow-up (Declines flu vaccine. ).    Follow up:    Anxiety: anxiety levels high, 'in my head', 'stressed out', all her adult children have moved back in with he, all her children 'have issues of their own'. Pt tired, stressed, spaced out, forgets. Eats one meal--over eats, binging sugar, sleeping '3 hours 15 minutes' each night. In past, mental health was managed by formal primary care provider.  Has never been seen by psychiatry, believes sertraline diagnosis was anxiety and depression.  No history of suicide attempts, or hospitalizations for mental illness.  Patient feels her anxiety is poorly controlled, aware needs assistance to stabilize her mental health.  Patient is talking to virtual LCSW team at [facility], feels sessions helpful.  Open to psychiatry consult.  History of alcohol abuse disorder, has been sober for over 2 years.  Followed by [facility] liver team--last visit 04/19/23, doing well.  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     I have reviewed past medical, surgical, medications, allergies, social and family histories today and updated them in Epic where appropriate.    ROS:       PHQ-9 PHQ-9 TOTAL SCORE   07/16/2023   1:30 PM 24   05/05/2023  11:15 AM 22   09/02/2022   2:00 PM 24   07/03/2022   1:00 PM 24   05/07/2022  10:00 AM 24      GAD7 Total Score GAD-7 Total Score   07/16/2023   1:30 PM 21   05/05/2023  11:15 AM 21   09/02/2022   2:00 PM 19   07/03/2022   1:00 PM 21   05/07/2022  10:00 AM 21      Review of systems negative unless otherwise noted as per HPI.      Objective:     Vitals:    07/16/23 1312   BP: 120/72   Pulse: 77   Temp: 36.6 ??C (97.8 ??F)   SpO2: 96%     Body mass index is 24.46 kg/m??.    Physical Exam  Vitals and nursing note reviewed.   Constitutional:       Appearance: Normal appearance.   Musculoskeletal:         General: Normal range of motion.   Skin:     General: Skin is warm.   Neurological:      General: No focal deficit present.      Mental Status: She is alert and oriented to person, place, and time. Mental status is at baseline.   Psychiatric: Comments: Appropriate with examiner, tearful at times             Medication adherence and barriers to the treatment plan have been addressed. Opportunities to optimize healthy behaviors have been discussed. Patient / caregiver voiced understanding.   I personally spent 35 minutes face-to-face and non-face-to-face in the care of this patient, which includes all pre, intra, and post visit time on the date of service.  Cleon Dew, DNP, FNP-C  [facility] Primary [facility] at [facility]  [phone] (F)    Note - This record has been created using [employer]. Chart creation errors have been sought, but may not always have been located. Such creation errors do not reflect on the standard of medical care.

## 2023-07-21 DIAGNOSIS — F1091 Alcohol use disorder in remission: Principal | ICD-10-CM

## 2023-07-21 DIAGNOSIS — F419 Anxiety disorder, unspecified: Principal | ICD-10-CM

## 2023-07-21 DIAGNOSIS — F32A Anxiety and depression: Principal | ICD-10-CM

## 2023-07-21 DIAGNOSIS — F332 Major depressive disorder, recurrent severe without psychotic features: Principal | ICD-10-CM

## 2023-07-23 NOTE — Unmapped (Signed)
CoCM appointment scheduled

## 2023-07-24 NOTE — Unmapped (Incomplete)
Suicide & Crisis Hotlines       43 Suicide and Crisis Lifeline  Call or text 5  Online Chat: www.988lifeline.org    The Hopeline   Phone: 4800436243    Online chat: www.hopeline.com    [place]   Phone: 1-800-SUICIDE (224-045-4847)    National Suicide Prevention Lifeline    Phone: 1-660-630-ZSWF (2673516261)   Online chat: www.suicidepreventionlifeline.org    Online chat (ages 68-24): https://gibson.com/        If you are experiencing a psychiatric emergency, you can call 911 or go to your nearest emergency room. Many counties have a specialized crisis center where you can walk in for a crisis assessment and referrals to additional services. Appointments are not needed, and they are open 24 hours a day, 7 days a week    24/7 Crisis Centers      [facility] (Adult patients only)  [address] [place] [address]  [phone]  Sunday - Saturday - 24 hours/day    [facility] (Adult and Pediatric patients)  [address] [place] [address]  [phone]  Sunday - Saturday - 24 hours/day    How to Find Mental Health Treatment   If you have health insurance, look on your insurance card for the phone number or website to find a list of mental health providers who accept your insurance.      If you have Medicaid, Medicare, or no insurance, see below for your county's MCO. Call the phone number to make an appointment or get more information.    If you have Medicaid, you can also get extra help finding services from the [facility] Office. Call them at [phone] from 8 a.m. to 5 p.m., Monday through Friday, except State holidays.      Mobile Crisis   Crisis situations are often best resolved at home. Mobile Crisis Teams are available 24 hours a day in all counties. Professional counselors will speak with you and your family during a visit. They have an average response time of 2 hours.     [employer] Served: [place], [place], [place], [place], [place], [place], [place]  Member and Recipient Services: [phone] - BH Crisis Line: [phone].    Website: AdSolver.gl  24/7 Mobile Crisis:    [employer] Crisis: [phone] - [phone]  [place], [place], [place], [place], [place], [place], [place])    [facility] Mobile Crisis: [phone]  [place], [place], [place], [place], [place], [place])    [facility] [employer] Crisis: [phone]  [facility])    Behavioral Health Urgent Care:    [facility]  [place], [place], [place], [place], [place], [place], [place])  Address: [address] [place], [place] [address]  Phone: [phone]  Hours: 24 hours a day    [facility] [place])  [place], [place], [place], [place], [place], [place], [place])  Address: [address], [place], [place] [address]   Phone: [phone]  Hours:  Monday-Thursday 8AM-10PM  Friday 8AM-8PM  Saturday-Sunday 8AM-5PM  (Last patients are registered one hour before closing.)      [place] Resources:    [place]---   [facility]   Address: [address] (3rd & 4th Floor) [place], [place]   Phone: [phone]  Hours: M-W, Th-F 8am-3pm; Saturday and Sunday 8am-4pm  [phone] for 24/7    [facility]   Address: [address] [place], [place]  Phone: [phone]   Hours: 24 hours a day    [facility] --- MORES [facility] Response, Engagement, and Stabilization)  Address:  [address] [place], [place] [address]  Phone: [phone]  Hours: M-F 10AM-10:30PM  After hours call: [phone]  [facility]  [address] [place], [place]  Phone: [phone]  Hours: M-F 8am-3pm    [facility]  Address: [address] [place]. [place], [place] [address]  Phone: [phone]  Hours: M-F 8am-5pm    [place]ia [place]---Crisis ED  [address] [place], [place] [address]  Phone: [phone]    Tawanna Sat [facility] Response, Engagement, and Stabilization)  Address:  [address] [place], [place] [address]  Phone: [phone]  Hours: M-F 10AM-10:30PM  After hours call: [phone]    [facility]  [address] Claris [place], [place]  Phone: [phone]  Hours: M-F 8am-5pm     [place]--RHA  [address] [place]   Phone: [phone]    [facility]  [address] [place], [place]  Phone: [phone]    [facility]   [address] Kendell [place], [place]   Phone: [phone]   Hours: 24 hours    [facility] --- MORES [facility] Response, Engagement, and Stabilization)  Address:  [address] [place], [place] [address]  Phone: [phone]  Hours: M-F 10AM-10:30PM  After hours call: [phone]      [facility] Response, Engagement, and Stabilization)  Address:  [address] [place], [place] [address]  Phone: [phone]  Hours: M-F 10AM-10:30PM  After hours call: [phone]      [facility] RESOURCES  Counties Served: [place], [place], [place], [place], [place], [place], [place], [place], [place], [place]town, [place], Engineer, maintenance (IT), [place], Gadsden, Frankford, Richmond Heights, Imperial, Dargan, Peabody, Keystone, Clear Creek, East [place], [place], Bloomville, [place]ur, [place], [place], [place], [place], [place], [place]le, Olymp[place], [place], [place], [place], [place], [place], Richey, Gorst, Cre[place], [place], [place], [place], [place], Melida Quitter   Crisis Line: [phone]  Website: www.trilliumhealthresources.org    24/7/365 Mobile Crisis    Behavioral Health Crisis Line: [phone]  ([place], [place], [place], [place], [place], Sheldon, [place], [place], [place], [place], [place], Engineer, maintenance (IT), [place], Cold Spring [place], [place], Par[place], Carm[place], Bell Arthur, Eagar, Bowring, Whitmire, East [place], Glass[place], Seton [place], Massanut[place], Fayetteville, Cerulean, Cove Creek, Jones[place], Pleasant Plains, Riverbend, Yoder, [place], [place], [place], Westbrook, R[place], [place] Water [place], Burgett[place], Fredonia, [place], White Hall, Owatonna, [place], Gibson, [place])    [place] [employer] Crisis: [phone]  [place], [place], Neva Seat, Webb Silversmith)    Integrated [employer] Crisis: [phone]  Veto Kemps, Crystal Lake, Wellsville, Grantsville, Lindale, Conway, Turley, Alda, Havana, Lueders, Hato Candal, Dare, Jefferson, Broseley, Nelson Lagoon, Yale, McDonald, Brockway, Semmes, Meridian, Sardis, Brightwood, Alto, [place], Oktaha, Hanover Park, Dryden, Prestbury, Jonesport, Parkdale, Bethesda, Andrews, Bexley, Midland, Tallaboa Alta, Loyola, Iron River, Magness, Colonia, Caulksville, Pap[place], Garner, Abbott, [place], Brownsville, Wilson)    [place] Crisis: [phone]  Gordy Levan, [place])    RHA Mobile Crisis [place]: [phone]  Dunfermline, Pal[place], Rector, Yetta Barre, Playa Fortuna, Birch Creek, Colerain and [employer])      [place] Resources:    [facility] Recovery Services  [address] [place], [place]   Phone: [phone]  Hours: M-F 8am-3pm    [place]---DREAM  [address] [place], [place]  Phone: [phone]  Hours: M-F Mayra Reel - CALL [phone]    [facility]  [address] [place], [place]  Phone: [phone]  Hours:  M-W 8am to 5pm, Th: 8am to 8pm, F: 8am to [address] County---RHA  [address] [place], [place]  Phone: [phone]   Hours: M-F 8am-5pm    [place]--Helping Hand of [employer] [address] Rushie [place], [place]   Phone: [phone]  Hours: M-F 8-5    [facility]  [address] [place], [place]  Phone: [phone]  Hours: M-F, 8am to 3pm    [facility] County--[employer]  [address] Maxie Better, [place], [place]  Phone: [phone]  Hours: Chipper Oman to [place] - CALL [phone]    [place]---RHA  [address] [place], [place]  Phone: [phone] / 24/7 Mobile Crisis: [phone]  Hours: M-F 8am-5pm    Rosezena Sensor [phone]    [facility]  [address], [place], [place]  Phone: [phone] / 24/7 Mobile Crisis: [phone]  Hours: M-F 8am-3pm    Jewel Baize [place]---PORT  [address] [place], [place]   Phone: [phone]  Hours: M-F 8am-5pm    [place] - CALL [phone]    [place]---PORT  [address], [place], [place]  Phone: [phone]  Hours: M-F 8am to 5pm    [facility]  [address], [place]  Phone: [phone]  Hours: M-F 8am-5pm    [facility]  [address] [place], [place]  Phone: [phone]  Hours: M-F 8am-3pm    Kevan Ny - CALL [phone]    Melina Copa  [address] [facility] [address] Elby [place], [place]  Phone: [phone]   Hours: M-F 8am-3pm    [place]---[facility]  [address] [place], [place]   Phone: [phone]  Hours: M-F 8am-3pm    [place]--Family Service of the [place]  [address], [place]  Phone: [phone]  Hours: M-F 8am to 3pm    [place]--RHA  [address] [place], [place]  Phone: [phone]  Hours: M-F 8am to [facility]   [address] [place], [place]  Phone: [phone]  Hours: Irish Lack 8:30am to 3pm    [place]--CALL [phone]    [facility] Recovery Services  [address] [place], [place]  Phone: [phone]  Hours: M-F Para Skeans [phone]    Melba Coon [phone]     [facility] Recovery Services  [address] [place], [place]  Phone: [phone]  Hours: M-F 8am-3pm    [facility]  [address], [place], [place]  Phone: [phone]  Hours: M-F 8am-5pm    Charolette Forward [phone]    [facility]  [address] [place], [place]  Phone: [phone]  Hours: M-F 8am-3pm    Christell Constant [facility] Recovery Services  [address] [place], [place]  Phone: [phone]  Hours: M-F 8am-3pm    Burt Ek  [address] [place], [place]  Phone: [phone]  Hours: M-F 8am to 1pm    [place]---PORT  [address] [place], [place]  [phone]  M-F 8to 5    [place]--Access Family Services  [address] [place], [place]  [phone]  M-F 8am to 6pm    [facility] of [employer]  [address], [place]  [phone]  M-F 8-5    [place]---CALL [phone]  [place]---PORT  [address] [place], [place]   Phone: [phone]    Janina Mayo of [place]  [address], [place], [place]  [phone]    [facility] Counseling  [address]  [phone]    [place] - CALL [phone]    [place]---PORT  [address] [place], [place]  Phone: [phone]   Hours: M-F 8am to 5pm    [facility]  [address] [place], [place]  Phone: [phone]  Hours: M-F 8am to 5pm    [place]---PORT  [address] [place], [place]  Phone: [phone]  Hours: Wednesday 8am-5pm    [facility]  [address], [place], [place]  Phone: [phone]  Hours: M 8-12, T-F, 1-5pm    [facility] Recovery Services  [address] [place], [place]  Phone: [phone]  Hours: M-F 8am-3pm    [facility] Recovery Services  [address] Albin [place], [place]  Phone: [phone]  Hours: M-F 8am to 3pm    [facility] Recovery Services  [address] [place], [place]  Phone: [phone]  Hours: M-F 8am-3pm    [facility]  [address] Salena Saner [place], [place]  Phone: [phone]   Hours: M-F 8am-3pm     [facility] Behavioral  [address] Driscilla [place], [place]   Phone: [phone]  Hours: M, W, F 8am to 11am     [facility] County---[facility]  [address] [place], [place]  Phone: [phone]  Hours: M-F 8am-11am, 1pm to 4pm     [place]---[facility]arch  [address] [place], [place]  Phone: [phone]  Hours: M-F Derek Jack - CALL [phone]    [place]---[facility]  [address] [place]  Phone: [phone]    [place] - CALL [phone]    [facility]  [address] [place], [place]  Phone: [phone]  Hours: M-F 8am-5pm     Lucila Maine  [address] [place], [place]  Phone: [phone]  Hours: M-F 8am-3pm          [facility]  [place] Served: [place], Cres[place], [place], [place], [place], [place], [place], [place], [place], Culber[place]on, Ruther[place], Ba[place]ys Harbor, Tollie Eth  Crisis Line: [phone]  Website: www.partnersbhm.org    24/7 Mobile Crisis:    [facility] Crisis: [phone]  Laurell Josephs, [place])    [facility] Recovery Services Mobile Crisis: [phone]  Marga Melnick, Cecil Cobbs, Berton Lan, [place], Renold Don, Vinson Moselle)    [address] Crisis: 855-5CRISIS [phone])  [place] [facility], Leonette Monarch, & [place])    [facility] 24/7 Crisis: [phone])  Laurell Josephs, L[place], Alton, Lemann[place], South [place], Loa, Bransford, Pierson, Kimmell, Ruther[place], Raymond, [place]al[place], Lakeshore, P[place]ees[place])    [place] Resources:    [facility] [employer]  [address] [place], [place]  Phone: [phone] / 24/7 Mobile Crisis: [phone]  Hours: M-F 9am-3pm    [facility] Recovery Services  [address] Laurell Josephs[address] [place], [place]  Phone: [phone]   Hours: M-F 8am-8pm    [facility]  [address] [place], [place]  Phone: [phone]   Hours: M-F 9am-3pm    [place]---[facility]arch  [address]  [place], [place]  Phone: [phone]  Hours: M-F 8am-3pm    [place] Recovery Services  [address] [place], [place]  Phone: [phone]  M, W, F 8am to 12pm    [facility] Recovery Services  [address], [place]  Phone: [phone] / 24/7 Mobile Crisis: [phone]  Hours: M-F 8am-8pm    [facility] Recovery  [address], [place], [place]  Phone: [phone] / 24/7 Mobile Crisis: [phone]  Hours: M-F 8am-5pm    Jamison Oka  [address] [place], [place]  Phone: [phone]  Hours: M-F 8am-3pm     Windy Canny [facility] Recovery Services  [address] Ninfa [place], [place]  Phone: [phone]   Hours: M-F 8am-3pm    [facility]  [address] [place], [place]  Phone: [phone]  Hours: M-F 8am-3pm    [place]--Family Preservation Services   [address] [place], [place]  Phone: [phone]    [facility] Recovery Services  [address] [place], [place]  Phone: [phone]   Hours: M-F 8am-8pm     Ena Dawley [facility] Recovery Services  [address] Letitia Libra, [place]  Phone: [phone]   Hours: M-F 8am-1pm    [facility] Recovery Services  [address] [place]. [place], [place]   Phone: [phone] / 24/7 Mobile Crisis: [phone]  Hours: M-F 8am-8pm    [place]--[facility] Recovery Services  [address] [place], [place]   Phone: [phone]    [facility] Recovery Services  [address] [place], [place]  Phone: [phone]   Hours: M-F 8am-11:30am        [facility] Served: [place], Lyn Hollingshead, Southern Ute, [place], [place]ale, Spaulding, Waller, Compo, Amana, Blair, Belle[place], Schuyl[place], Weissport, Des Lacs, Bal Harbour, Tul[place], Val[place], [place]aver [place], Seven Corners, [place], [place], [place], [place], Clear[place], [place], [place]ory [place], Cecilie Kicks  Crisis Line: [phone]  Website: www.vayahealth.com    24/7 Mobile Crisis:    [facility] Recovery Services Mobile Crisis: [phone]     ([address], [place], [place], Nucla, Roda Shutters, Coulter, [place], Bryson, [place]add[place], [place])    [employer] Crisis Team [place]: [phone]  Lyn Hollingshead, [place], S[place]ons [place], [place], [place], [place], [place], [place], [place], [place])    [facility] Access to [employer] (Mobile Crisis Connection): [phone]  ([address], Lyn Hollingshead, Scientist, physiological, Hanson, Fair[place], Orangeburg, Waukon, La Vernia, Rock Hall, Newhall, South [place], Bull Run Mountain Estates, Radisson, Raeford, St. Michael, Edina, Moraine, Latimer, Pomeroy, Rock[place], Indian Hills, [place], Cape [place], Ros[place], [place]ide, South Mon[place], Elizabeth, Quay, Babbie, [place], [place], P[place])    [place] Resources:    [place]---RHA  [address] [place], [place]  Phone: [phone]  Hours: M,W,F 8am to 2pm    [facility]  [address] [place]  Phone: [phone]   Hours: M-F 8am-5pm    [facility] Recovery Services  [address] [place], [place]  Phone: [phone]   Hours: M-F 8am-5pm    [facility] Recovery Services  [address] [place], [place]  Phone: [phone]   Hours: M-F 8am-5pm    Denny Peon [facility] Recovery Services  [address], [place]  Phone: [phone]   Hours: M-F 8am-5pm    [facility] Preservation Services  [address], [place], [place]  Phone: [phone]  Hours: M-F 8am-5pm    [place]--RHA  [address]  Phone: [phone]  Hours: M-F 8:30am to 5:30pm     Paula Libra  [address], [place], [place]  Phone: [phone]   Hours: M-F 8am-5pm    [place]---RHA   [address] [place]  Phone: [phone]  Hours: M-Th, 8am to 3pm    [facility] Recovery Services  [address] [place], [place]   Phone: [phone]    [facility] County---NCG/Appalachian Community Services  [address] [place], [place]  Phone: [phone]   Hours: M-F 9am-5pm    [facility]  [address] [place], [place]  Phone: [phone]   Hours: M-F 9am-5pm    [place]---[facility]  [address], [place], [place]  Phone: [phone]    [facility]  [address] [place], [place]  Phone: [phone]   Hours: M-F 9am-5pm    Ardine Eng [phone]  No Walk-In    [facility] County---NCG/Appalachian [employer]  [address] [place], [place]  Phone: [phone]  Hours: M-F 9am-5pm    [facility]  [address], [place]  Phone: [phone]  Hours: 8:30am to 5pm    [facility] Preservation Services  [address] [place], [place]  Phone: [phone]  Hours: M-F 9am-1pm    Andres Ege [facility]  [address] [place], [place]  Phone: [phone]  Hours: M-F 9am-5pm    [facility]  [address], [place], [place]  Phone: [phone]   Hours: M-F 9am-5pm    [facility]  [address] [place], [place]  Phone: [phone]  Hours: M-F 9a-5p    [place]---RHA  [address] [place], [place]  Phone: [phone]   Hours: M-F 8am-5pm    Diona [place]---RHA  [address] [place], [place]   Phone: [phone]   Hours: M-F 8am-5pm    [facility] County---RHA  [address] [place], [place]  Phone: [phone]   Hours: M-F 9am-5pm    [place]--- CALL [phone] (No Walk-in)    [facility] Preservation Services  [address] [place], [place]   Phone: [phone]  Hours: M-F 9am-5pm    Edison [facility]ace Recovery Services  [address] [place], [place]  Phone: [phone] / 24/7 Mobile Crisis: [phone]  Hours: M-F 8am-3pm    [facility] Recovery Services  [address] [place], [place]  Phone: [phone]    [facility] Recovery Services  [address] [place], [place]  Phone: [phone]   Hours: M-F 8am-8pm    [facility] Recovery Services  [address] [place], [place]  Phone: [phone]  Hours: M, W 11:30am to 1:30, F 8am to 12pm    [facility]  [address] [place], [place]  Phone: [phone]   Hours: M-F 9am-5pm    Francee Pic[facility]  [address], [place]  Phone: [phone]  Hours: M-F 9a to 5p    [facility] Recovery Services  [address] [place] [place], [place]  Phone: [phone]   Hours: M-F 8am-5pm    Faylene [place]--Recovery Innovations  [address] [place], [place]  Phone: [phone]    [facility] Recovery Services  [address], [place], [place]  Phone: [phone]   Hours: M-F 8am-5pm    [facility] Recovery Services  [address] [place], [place]  Phone: [phone]   Hours: M-F 8am-5pm     [place]resa Res County---RHA  [address] Tessie F[place], [place]  Phone: [phone]   Hours: M-F 9a-5p

## 2023-07-24 NOTE — Unmapped (Signed)
Specialty Medication(s): Xifaxan 550mg     Erin Baxter has been dis-enrolled from the [facility] specialty pharmacy services due to  the Xifaxan being discontinued as per the Epic progress note dated 10/29/2022.  She has recompensated cirrhosis .    I can re-enroll the patient to receive this medication in the future if needed.    Additional information provided to the patient: n/a    Roderic Palau, PharmD  [facility] Specialty Pharmacist

## 2023-08-06 ENCOUNTER — Ambulatory Visit: Admit: 2023-08-06 | Discharge: 2023-08-07 | Payer: PRIVATE HEALTH INSURANCE | Attending: Clinical | Primary: Clinical

## 2023-08-06 DIAGNOSIS — F419 Anxiety disorder, unspecified: Principal | ICD-10-CM

## 2023-08-06 DIAGNOSIS — F1091 Alcohol use disorder in remission: Principal | ICD-10-CM

## 2023-08-06 DIAGNOSIS — F332 Major depressive disorder, recurrent severe without psychotic features: Principal | ICD-10-CM

## 2023-08-06 DIAGNOSIS — F32A Anxiety and depression: Principal | ICD-10-CM

## 2023-08-07 DIAGNOSIS — F32A Anxiety and depression: Principal | ICD-10-CM

## 2023-08-07 DIAGNOSIS — F419 Anxiety disorder, unspecified: Principal | ICD-10-CM

## 2023-08-07 MED ORDER — FLUOXETINE 40 MG CAPSULE
ORAL_CAPSULE | 1 refills | 0 days | Status: CP
Start: 2023-08-07 — End: ?

## 2023-08-11 DIAGNOSIS — F332 Major depressive disorder, recurrent severe without psychotic features: Principal | ICD-10-CM

## 2023-08-13 ENCOUNTER — Ambulatory Visit: Admit: 2023-08-13 | Discharge: 2023-08-13 | Payer: PRIVATE HEALTH INSURANCE

## 2023-08-13 ENCOUNTER — Ambulatory Visit
Admit: 2023-08-13 | Payer: PRIVATE HEALTH INSURANCE | Attending: Physical Medicine & Rehabilitation | Primary: Physical Medicine & Rehabilitation

## 2023-08-13 DIAGNOSIS — M5410 Radiculopathy, site unspecified: Principal | ICD-10-CM

## 2023-08-13 DIAGNOSIS — M5030 Other cervical disc degeneration, unspecified cervical region: Principal | ICD-10-CM

## 2023-08-14 DIAGNOSIS — F411 Generalized anxiety disorder: Principal | ICD-10-CM

## 2023-08-20 ENCOUNTER — Ambulatory Visit: Admit: 2023-08-20 | Discharge: 2023-08-21 | Payer: PRIVATE HEALTH INSURANCE | Attending: Clinical | Primary: Clinical

## 2023-08-20 DIAGNOSIS — F332 Major depressive disorder, recurrent severe without psychotic features: Principal | ICD-10-CM

## 2023-08-25 DIAGNOSIS — F411 Generalized anxiety disorder: Principal | ICD-10-CM

## 2023-09-03 ENCOUNTER — Ambulatory Visit: Admit: 2023-09-03 | Payer: PRIVATE HEALTH INSURANCE | Attending: Clinical | Primary: Clinical

## 2023-09-11 DIAGNOSIS — F419 Anxiety disorder, unspecified: Principal | ICD-10-CM

## 2023-09-11 DIAGNOSIS — F32A Anxiety and depression: Principal | ICD-10-CM

## 2023-10-06 DIAGNOSIS — E039 Hypothyroidism, unspecified: Principal | ICD-10-CM

## 2023-10-06 MED ORDER — LEVOTHYROXINE 100 MCG TABLET
ORAL_TABLET | Freq: Every day | ORAL | 0 refills | 90 days | Status: CP
Start: 2023-10-06 — End: ?

## 2023-10-23 DIAGNOSIS — N760 Acute vaginitis: Principal | ICD-10-CM

## 2023-10-23 MED ORDER — VALACYCLOVIR 1 GRAM TABLET
ORAL_TABLET | Freq: Two times a day (BID) | ORAL | 0 refills | 7.00 days | Status: CP
Start: 2023-10-23 — End: 2023-10-30

## 2023-10-23 MED ORDER — METRONIDAZOLE 500 MG TABLET
ORAL_TABLET | 0 refills | 0.00 days | Status: CP
Start: 2023-10-23 — End: ?

## 2023-10-30 ENCOUNTER — Ambulatory Visit: Admit: 2023-10-30 | Payer: PRIVATE HEALTH INSURANCE

## 2023-10-30 DIAGNOSIS — K7030 Alcoholic cirrhosis of liver without ascites: Principal | ICD-10-CM

## 2023-11-20 DIAGNOSIS — G4700 Insomnia, unspecified: Principal | ICD-10-CM

## 2023-11-20 DIAGNOSIS — F32A Anxiety and depression: Principal | ICD-10-CM

## 2023-11-20 DIAGNOSIS — F419 Anxiety disorder, unspecified: Principal | ICD-10-CM

## 2023-11-20 MED ORDER — QUETIAPINE 25 MG TABLET
ORAL_TABLET | Freq: Every evening | ORAL | 1 refills | 90.00 days | Status: CP
Start: 2023-11-20 — End: ?

## 2023-12-08 ENCOUNTER — Inpatient Hospital Stay: Admit: 2023-12-08 | Discharge: 2023-12-09 | Payer: PRIVATE HEALTH INSURANCE

## 2024-01-19 DIAGNOSIS — F411 Generalized anxiety disorder: Principal | ICD-10-CM

## 2024-01-19 DIAGNOSIS — E039 Hypothyroidism, unspecified: Principal | ICD-10-CM

## 2024-01-19 MED ORDER — SERTRALINE 50 MG TABLET
ORAL_TABLET | Freq: Every day | ORAL | 1 refills | 90.00 days | Status: CP
Start: 2024-01-19 — End: 2024-01-19

## 2024-01-19 MED ORDER — LEVOTHYROXINE 100 MCG TABLET
ORAL_TABLET | Freq: Every day | ORAL | 0 refills | 90.00 days | Status: CP
Start: 2024-01-19 — End: ?

## 2024-02-02 ENCOUNTER — Ambulatory Visit: Admit: 2024-02-02 | Discharge: 2024-02-03 | Payer: MEDICARE

## 2024-02-02 DIAGNOSIS — D6959 Other secondary thrombocytopenia: Principal | ICD-10-CM

## 2024-02-02 DIAGNOSIS — Z1211 Encounter for screening for malignant neoplasm of colon: Principal | ICD-10-CM

## 2024-02-02 DIAGNOSIS — F32A Anxiety and depression: Principal | ICD-10-CM

## 2024-02-02 DIAGNOSIS — N951 Menopausal and female climacteric states: Principal | ICD-10-CM

## 2024-02-02 DIAGNOSIS — F17200 Nicotine dependence, unspecified, uncomplicated: Principal | ICD-10-CM

## 2024-02-02 DIAGNOSIS — R7303 Prediabetes: Principal | ICD-10-CM

## 2024-02-02 DIAGNOSIS — E038 Other specified hypothyroidism: Principal | ICD-10-CM

## 2024-02-02 DIAGNOSIS — F419 Anxiety disorder, unspecified: Principal | ICD-10-CM

## 2024-02-02 DIAGNOSIS — R5383 Other fatigue: Principal | ICD-10-CM

## 2024-02-02 DIAGNOSIS — E039 Hypothyroidism, unspecified: Principal | ICD-10-CM

## 2024-02-02 DIAGNOSIS — F1091 Alcohol use disorder in remission: Principal | ICD-10-CM

## 2024-02-02 DIAGNOSIS — R799 Abnormal finding of blood chemistry, unspecified: Principal | ICD-10-CM

## 2024-02-02 DIAGNOSIS — K7030 Alcoholic cirrhosis of liver without ascites: Principal | ICD-10-CM

## 2024-02-02 DIAGNOSIS — K7469 Other cirrhosis of liver: Principal | ICD-10-CM

## 2024-02-02 DIAGNOSIS — F1020 Alcohol dependence, uncomplicated: Principal | ICD-10-CM

## 2024-02-02 DIAGNOSIS — Z1231 Encounter for screening mammogram for malignant neoplasm of breast: Principal | ICD-10-CM

## 2024-02-02 MED ORDER — PAROXETINE 10 MG TABLET
ORAL_TABLET | Freq: Every day | ORAL | 2 refills | 30 days | Status: CP
Start: 2024-02-02 — End: 2025-02-01

## 2024-02-10 DIAGNOSIS — F419 Anxiety disorder, unspecified: Principal | ICD-10-CM

## 2024-02-10 DIAGNOSIS — F32A Anxiety and depression: Principal | ICD-10-CM

## 2024-02-10 MED ORDER — FLUOXETINE 40 MG CAPSULE
ORAL_CAPSULE | Freq: Every day | ORAL | 1 refills | 90 days
Start: 2024-02-10 — End: ?

## 2024-03-11 DIAGNOSIS — A630 Anogenital (venereal) warts: Principal | ICD-10-CM

## 2024-03-11 MED ORDER — IMIQUIMOD 5 % TOPICAL CREAM PACKET
PACK | TOPICAL | 1 refills | 0.00000 days | Status: CP
Start: 2024-03-11 — End: 2025-03-11

## 2024-03-31 ENCOUNTER — Other Ambulatory Visit: Admit: 2024-03-31 | Discharge: 2024-04-01 | Payer: MEDICARE | Attending: Clinical | Primary: Clinical

## 2024-03-31 DIAGNOSIS — F32A Anxiety and depression: Principal | ICD-10-CM

## 2024-03-31 DIAGNOSIS — F419 Anxiety disorder, unspecified: Principal | ICD-10-CM

## 2024-04-07 DIAGNOSIS — F419 Anxiety disorder, unspecified: Principal | ICD-10-CM

## 2024-04-07 DIAGNOSIS — F332 Major depressive disorder, recurrent severe without psychotic features: Principal | ICD-10-CM

## 2024-04-19 DIAGNOSIS — F332 Major depressive disorder, recurrent severe without psychotic features: Principal | ICD-10-CM

## 2024-04-20 ENCOUNTER — Ambulatory Visit: Admit: 2024-04-20 | Payer: MEDICARE

## 2024-05-02 DIAGNOSIS — F419 Anxiety disorder, unspecified: Principal | ICD-10-CM

## 2024-05-02 DIAGNOSIS — F32A Anxiety and depression: Principal | ICD-10-CM

## 2024-05-02 DIAGNOSIS — N951 Menopausal and female climacteric states: Principal | ICD-10-CM

## 2024-05-02 MED ORDER — PAROXETINE 10 MG TABLET
ORAL_TABLET | Freq: Every day | ORAL | 2 refills | 0.00000 days
Start: 2024-05-02 — End: ?

## 2024-05-03 MED ORDER — PAROXETINE 10 MG TABLET
ORAL_TABLET | Freq: Every day | ORAL | 2 refills | 30.00000 days | Status: CP
Start: 2024-05-03 — End: ?

## 2024-05-07 DIAGNOSIS — F419 Anxiety disorder, unspecified: Principal | ICD-10-CM

## 2024-05-07 DIAGNOSIS — F332 Major depressive disorder, recurrent severe without psychotic features: Principal | ICD-10-CM

## 2024-05-24 DIAGNOSIS — F1091 Alcohol use disorder in remission: Principal | ICD-10-CM

## 2024-05-24 DIAGNOSIS — N951 Menopausal and female climacteric states: Principal | ICD-10-CM

## 2024-05-24 DIAGNOSIS — F17200 Nicotine dependence, unspecified, uncomplicated: Principal | ICD-10-CM

## 2024-05-24 MED ORDER — ESTRADIOL 0.01% (0.1 MG/GRAM) VAGINAL CREAM
VAGINAL | 3 refills | 0.00000 days | Status: CP
Start: 2024-05-24 — End: ?

## 2024-05-24 MED ORDER — PROGESTERONE MICRONIZED 200 MG CAPSULE
ORAL_CAPSULE | ORAL | 3 refills | 0.00000 days | Status: CP
Start: 2024-05-24 — End: ?

## 2024-05-24 MED ORDER — ESTRADIOL 1 MG TABLET
ORAL_TABLET | Freq: Every day | ORAL | 3 refills | 90.00000 days | Status: CP
Start: 2024-05-24 — End: 2025-05-24

## 2024-05-26 DIAGNOSIS — F332 Major depressive disorder, recurrent severe without psychotic features: Principal | ICD-10-CM

## 2024-05-31 DIAGNOSIS — F332 Major depressive disorder, recurrent severe without psychotic features: Principal | ICD-10-CM

## 2024-06-01 DIAGNOSIS — E038 Other specified hypothyroidism: Principal | ICD-10-CM

## 2024-06-01 DIAGNOSIS — A630 Anogenital (venereal) warts: Principal | ICD-10-CM

## 2024-06-01 DIAGNOSIS — F419 Anxiety disorder, unspecified: Principal | ICD-10-CM

## 2024-06-01 DIAGNOSIS — F17200 Nicotine dependence, unspecified, uncomplicated: Principal | ICD-10-CM

## 2024-06-01 DIAGNOSIS — I10 Essential (primary) hypertension: Principal | ICD-10-CM

## 2024-06-01 DIAGNOSIS — F32A Anxiety and depression: Principal | ICD-10-CM

## 2024-06-01 DIAGNOSIS — K7030 Alcoholic cirrhosis of liver without ascites: Principal | ICD-10-CM

## 2024-06-01 DIAGNOSIS — M542 Cervicalgia: Principal | ICD-10-CM

## 2024-06-01 DIAGNOSIS — F1091 Alcohol use disorder in remission: Principal | ICD-10-CM

## 2024-06-01 DIAGNOSIS — R7303 Prediabetes: Principal | ICD-10-CM

## 2024-06-01 DIAGNOSIS — G8929 Other chronic pain: Principal | ICD-10-CM

## 2024-06-01 DIAGNOSIS — M5441 Lumbago with sciatica, right side: Principal | ICD-10-CM

## 2024-06-01 MED ORDER — NICOTINE (POLACRILEX) 4 MG GUM
BUCCAL | 3 refills | 5.00000 days | Status: CP | PRN
Start: 2024-06-01 — End: ?

## 2024-06-01 MED ORDER — IMIQUIMOD 5 % TOPICAL CREAM PACKET
PACK | TOPICAL | 1 refills | 0.00000 days | Status: CP
Start: 2024-06-01 — End: 2025-06-01

## 2024-07-02 DIAGNOSIS — E039 Hypothyroidism, unspecified: Principal | ICD-10-CM

## 2024-07-02 MED ORDER — LEVOTHYROXINE 100 MCG TABLET
ORAL_TABLET | Freq: Every day | ORAL | 0 refills | 90.00000 days | Status: CP
Start: 2024-07-02 — End: ?

## 2024-07-26 DIAGNOSIS — F419 Anxiety disorder, unspecified: Principal | ICD-10-CM

## 2024-07-26 DIAGNOSIS — F32A Anxiety and depression: Principal | ICD-10-CM

## 2024-11-14 DIAGNOSIS — E039 Hypothyroidism, unspecified: Principal | ICD-10-CM

## 2024-11-14 MED ORDER — LEVOTHYROXINE 100 MCG TABLET
ORAL_TABLET | Freq: Every day | ORAL | 0 refills | 0.00000 days
Start: 2024-11-14 — End: ?

## 2024-11-15 MED ORDER — LEVOTHYROXINE 100 MCG TABLET
ORAL_TABLET | Freq: Every day | ORAL | 0 refills | 90.00000 days | Status: CP
Start: 2024-11-15 — End: ?

## 2024-11-25 DIAGNOSIS — Z1289 Encounter for screening for malignant neoplasm of other sites: Principal | ICD-10-CM

## 2024-11-25 DIAGNOSIS — K7469 Other cirrhosis of liver: Principal | ICD-10-CM
